# Patient Record
Sex: Male | Born: 1940 | Race: White | Hispanic: No | Smoking: Former smoker
Health system: Southern US, Community
[De-identification: ages and names within clinical notes are randomized; demographics above are authoritative.]

## PROBLEM LIST (undated history)

## (undated) DIAGNOSIS — F29 Unspecified psychosis not due to a substance or known physiological condition: Secondary | ICD-10-CM

## (undated) DIAGNOSIS — F0390 Unspecified dementia without behavioral disturbance: Secondary | ICD-10-CM

## (undated) DIAGNOSIS — F419 Anxiety disorder, unspecified: Secondary | ICD-10-CM

## (undated) DIAGNOSIS — M1990 Unspecified osteoarthritis, unspecified site: Secondary | ICD-10-CM

## (undated) DIAGNOSIS — I739 Peripheral vascular disease, unspecified: Secondary | ICD-10-CM

## (undated) DIAGNOSIS — E119 Type 2 diabetes mellitus without complications: Secondary | ICD-10-CM

## (undated) DIAGNOSIS — I10 Essential (primary) hypertension: Secondary | ICD-10-CM

## (undated) DIAGNOSIS — D649 Anemia, unspecified: Secondary | ICD-10-CM

## (undated) DIAGNOSIS — I2510 Atherosclerotic heart disease of native coronary artery without angina pectoris: Secondary | ICD-10-CM

## (undated) DIAGNOSIS — N400 Enlarged prostate without lower urinary tract symptoms: Secondary | ICD-10-CM

## (undated) DIAGNOSIS — M069 Rheumatoid arthritis, unspecified: Secondary | ICD-10-CM

## (undated) DIAGNOSIS — E7800 Pure hypercholesterolemia, unspecified: Secondary | ICD-10-CM

## (undated) DIAGNOSIS — R531 Weakness: Secondary | ICD-10-CM

## (undated) DIAGNOSIS — F32A Depression, unspecified: Secondary | ICD-10-CM

## (undated) DIAGNOSIS — I70269 Atherosclerosis of native arteries of extremities with gangrene, unspecified extremity: Secondary | ICD-10-CM

## (undated) DIAGNOSIS — F329 Major depressive disorder, single episode, unspecified: Secondary | ICD-10-CM

## (undated) DIAGNOSIS — R269 Unspecified abnormalities of gait and mobility: Secondary | ICD-10-CM

## (undated) DIAGNOSIS — F319 Bipolar disorder, unspecified: Secondary | ICD-10-CM

## (undated) HISTORY — DX: Peripheral vascular disease, unspecified: I73.9

## (undated) HISTORY — PX: BALLOON ANGIOPLASTY, ARTERY: SHX564

---

## 1968-03-17 HISTORY — PX: KNEE ARTHROSCOPY: SUR90

## 2000-07-23 ENCOUNTER — Encounter (HOSPITAL_COMMUNITY): Admission: RE | Admit: 2000-07-23 | Discharge: 2000-08-22 | Payer: Self-pay | Admitting: Rheumatology

## 2000-07-23 ENCOUNTER — Encounter: Payer: Self-pay | Admitting: Rheumatology

## 2000-11-19 ENCOUNTER — Encounter (HOSPITAL_COMMUNITY): Admission: RE | Admit: 2000-11-19 | Discharge: 2000-12-19 | Payer: Self-pay | Admitting: Rheumatology

## 2000-12-31 ENCOUNTER — Encounter (HOSPITAL_COMMUNITY): Admission: RE | Admit: 2000-12-31 | Discharge: 2001-01-30 | Payer: Self-pay | Admitting: Rheumatology

## 2001-02-25 ENCOUNTER — Encounter (HOSPITAL_COMMUNITY): Admission: RE | Admit: 2001-02-25 | Discharge: 2001-03-27 | Payer: Self-pay | Admitting: Oncology

## 2001-04-22 ENCOUNTER — Encounter (HOSPITAL_COMMUNITY): Admission: RE | Admit: 2001-04-22 | Discharge: 2001-05-22 | Payer: Self-pay | Admitting: Rheumatology

## 2001-06-17 ENCOUNTER — Encounter (HOSPITAL_COMMUNITY): Admission: RE | Admit: 2001-06-17 | Discharge: 2001-07-17 | Payer: Self-pay | Admitting: Rheumatology

## 2001-08-26 ENCOUNTER — Encounter (HOSPITAL_COMMUNITY): Admission: RE | Admit: 2001-08-26 | Discharge: 2001-09-25 | Payer: Self-pay | Admitting: Rheumatology

## 2001-10-07 ENCOUNTER — Encounter (HOSPITAL_COMMUNITY): Admission: RE | Admit: 2001-10-07 | Discharge: 2001-11-06 | Payer: Self-pay | Admitting: Rheumatology

## 2001-12-30 ENCOUNTER — Encounter (HOSPITAL_COMMUNITY): Admission: RE | Admit: 2001-12-30 | Discharge: 2002-01-29 | Payer: Self-pay | Admitting: Rheumatology

## 2002-03-24 ENCOUNTER — Encounter (HOSPITAL_COMMUNITY): Admission: RE | Admit: 2002-03-24 | Discharge: 2002-04-23 | Payer: Self-pay | Admitting: Rheumatology

## 2002-06-30 ENCOUNTER — Encounter (HOSPITAL_COMMUNITY): Admission: RE | Admit: 2002-06-30 | Discharge: 2002-07-30 | Payer: Self-pay | Admitting: Rheumatology

## 2002-10-06 ENCOUNTER — Encounter (HOSPITAL_COMMUNITY): Admission: RE | Admit: 2002-10-06 | Discharge: 2002-11-05 | Payer: Self-pay | Admitting: Rheumatology

## 2002-12-01 ENCOUNTER — Encounter (HOSPITAL_COMMUNITY): Admission: RE | Admit: 2002-12-01 | Discharge: 2002-12-15 | Payer: Self-pay | Admitting: Rheumatology

## 2006-05-01 ENCOUNTER — Ambulatory Visit: Payer: Self-pay | Admitting: Cardiology

## 2006-05-15 ENCOUNTER — Ambulatory Visit: Payer: Self-pay | Admitting: Cardiology

## 2007-05-20 ENCOUNTER — Ambulatory Visit (HOSPITAL_COMMUNITY): Admission: RE | Admit: 2007-05-20 | Discharge: 2007-05-20 | Payer: Self-pay | Admitting: Ophthalmology

## 2007-06-17 ENCOUNTER — Ambulatory Visit (HOSPITAL_COMMUNITY): Admission: RE | Admit: 2007-06-17 | Discharge: 2007-06-17 | Payer: Self-pay | Admitting: Ophthalmology

## 2010-03-17 HISTORY — PX: TRANSURETHRAL RESECTION OF PROSTATE: SHX73

## 2010-08-02 NOTE — Consult Note (Signed)
NAME:  Terry Green, Terry Green NO.:  [id number]   MEDICAL RECORD NO.:  [id number]                   PATIENT TYPE:   LOCATION:                                       FACILITY:   PHYSICIAN:  Aundra Dubin, M.D.            DATE OF BIRTH:   DATE OF CONSULTATION:  12/30/2001  DATE OF DISCHARGE:                                   CONSULTATION   CHIEF COMPLAINT:  RA, diabetes.   HISTORY OF PRESENT ILLNESS:  The patient returns reporting that he is doing  quite well.  There are no active joints that are bothering him.  However, he  stopped the methotrexate three weeks ago.  He has had no flaring since this  time.  He is tolerating the Plaquenil and there has been no nausea.  He has  had no URIs, fever, cough, nausea, stomatitis, or shortness of breath.  His  weight is down 6 pounds and he says he has been working a great deal.  He  brings a list of his morning blood sugars for about three weeks and one was  160 and another 180, and otherwise they were quite good, being less than  120.  There is no polyuria or polydipsia.   MEDICINES:  1. Methotrexate 25 mg - presently off.  2. Folic acid 1 mg q.d.  3. Plaquenil 600 mg q.d.  4. Prednisone 5 mg q.d.  5. Glucotrol XL 5 mg q.d.  6. Glucovance 5/500 mg b.i.d.  7. Avalide one q.d.  8. Starlix 120 mg q.d.   PHYSICAL EXAMINATION:  VITAL SIGNS:  Weight 174 pounds, blood pressure  138/78, respirations 16.  GENERAL:  No distress.  LUNGS:  Clear.  HEART:  Regular with no murmur.  LOWER EXTREMITIES:  No edema.  MUSCULOSKELETAL:  He continues with chronic swollen PIPs which are stiff and  do not fully flex.  They are nontender.  The MCPs appear most swollen at the  second and third MCPs but they are nontender.  The wrists demonstrate mild  stiffness, elbows extended fully, and there were no nodules.  Shoulders with  good range of motion, back nontender.  Knees, ankles, and feet had no active  swelling and were all  nontender.   ASSESSMENT AND PLAN:  1. Rheumatoid arthritis.  Presently he is doing quite well.  He has not been     off of methotrexate long enough to typically flare.  I have encouraged     him to continue with the methotrexate at 25 mg every week.  I am lowering     the Plaquenil to 400 mg q.d.  The prednisone will be lowered to 5 mg     q.o.d., taken on even days.  Labs checked on October 07, 2001 showed a     creatinine 0.9, AST 17, albumin 3.5.  WBC 6.7, HGB 12.6, PLT 259.  Will  check labs today.  2.     Diabetes.  He seems to be in good control and hopefully we will have him off     of prednisone over the next several months.   He will return in three months.                                               Aundra Dubin, M.D.    WWT/MEDQ  D:  12/30/2001  T:  12/30/2001  Job:  [id number]   cc:   Doreen Beam  [address]  [place]  [place] [address]  Fax: [phone]

## 2010-08-02 NOTE — Consult Note (Signed)
[facility]  Patient:    Terry Green, Terry Green Visit Number: [id number] MRN: [id number]          Service Type: RHE Location: [facility] Attending Physician:  Aundra Dubin Dictated by:   Aundra Dubin, M.D. Proc. Date: 04/22/01 Admit Date:  04/22/2001   CC:         Xaje A. Olena Leatherwood, M.D.   Consultation Report  CHIEF COMPLAINT:  Rheumatoid arthritis, diabetes.  HISTORY:  Terry Green returns reporting that with the cold weather he is significantly aching in his fingers.  Several PIPs are swollen and hurt considerably.  He is stiff in the mornings now for about 45 minutes.  Other joints are not bothering him.  He stated that he still feels more overall improved than he was before the methotrexate.  There have been no URIs, fever, cough, nausea, stomatitis, or shortness of breath.  The diabetes seems to be under well control by his report and there is no polyuria or polydipsia.  MEDICATIONS: 1. Methotrexate 20 mg q.week. 2. Glucovance 5/500 mg q.d. 3. Prednisone 5 mg q.d. 4. Folic acid 1 mg q.d. 5. Calcium 600 mg b.i.d. 6. Starlix 120 mg a.c. 7. Accupril 20 mg q.d.  PHYSICAL EXAMINATION  VITAL SIGNS:  Weight 176 pounds, blood pressure 150/80, respirations 20.  GENERAL:  He appears well.  LUNGS:  Clear.  HEART:  Regular.  No murmur.  NECK:  Negative JVD.  EXTREMITIES:  Lower extremities:  No edema.  MUSCULOSKELETAL:  On the bilateral hands the third and second PIPs are swollen, boggy, and tender.  The MCPs are also tender.  There is mild warmth to the wrist and there is slight tenderness.  Elbows extend almost fully and are nontender.  Shoulders, knees, ankles, and feet have a good range of motion and are nontender.  ASSESSMENT AND PLAN: 1. Rheumatoid arthritis.  He has continued to show some swelling to the    fingers.  I will have him increase the methotrexate to 22.5 mg q.week and    he will continue the folic acid.  For this mild to  moderate flare he is    given an injection of 120 mg of Depo-Medrol.  He will overall continue on 5    mg prednisone q.d. and continue taking the calcium. 2. Diabetes.  I have also advised him that his blood sugars have good    likelihood of increasing into the 200 range for two to four days.  We will check laboratories today.  Laboratories were stable in December when they were last checked.  We will then recheck laboratories in two months.  I will see him back in three months. Dictated by:   Aundra Dubin, M.D. Attending Physician:  Aundra Dubin DD:  04/22/01 TD:  04/22/01 Job: [id number]

## 2010-08-02 NOTE — Consult Note (Signed)
[name], Green                          ACCOUNT NO.:  [id number]   MEDICAL RECORD NO.:  [id number]                   PATIENT TYPE:   LOCATION:                                       FACILITY:   PHYSICIAN:  Aundra Dubin, M.D.            DATE OF BIRTH:   DATE OF CONSULTATION:  12/01/2002  DATE OF DISCHARGE:                                   CONSULTATION   CHIEF COMPLAINT:  Rheumatoid arthritis, diabetes.   INDICATIONS FOR PROCEDURE:  Terry Green returns reporting that he is doing  well.  He has no achy joints.  He feels that he has good grip strength.  He  says he has no trouble and no pain.  He has had no URIs, fever, cough,  shortness of breath, polyuria, polydipsia.  He feels that his blood sugars  have been under good control.  There has been no visual changes.  His weight  is stable, and he is down 2 pounds.   MEDICATIONS:  1. Methotrexate 25 mg every week.  2. Plaquenil 400 mg daily.  3. Calcium daily.  4. Folic acid 1 mg daily.  5. Glucovance 5/500 one b.i.d.  6. Avalide 300/12.5 mg daily.  7. Starlix 120 mg daily.  8. Cartia XT 120 mg daily.   PHYSICAL EXAMINATION:  VITAL SIGNS:  Weight 184 pounds, blood pressure  160/80, respirations 16.  GENERAL:  He appears healthy.  LUNGS:  Clear.  NECK:  Negative JVD.  Normal thyroid.  HEART:  Regular.  No murmur.  MUSCULOSKELETAL:  He has a great deal of stiffness that might be related to  the diabetes.  There are mild-to-moderate arthritic changes at the MCPs and  PIPs, but these areas are cool and nontender.  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  It is nontender.  The elbows extend almost fully.  The shoulders  move well with slight stiffness.  The knees, ankles, and feet have a good  range of motion and are nontender.   ASSESSMENT AND PLAN:  1. Rheumatoid arthritis.  He is quite stable.  Labs checked on 10/06/2002     show an AST of 18, albumin 3.6, creatinine 1.0, WBC 5.7, HGB 13.5, PLT     231.  Terry Green  has been notified that I will no longer be coming to     [facility] for my usual clinic.  This is stopping in late October.  He is     desiring to follow up with a rheumatologist in [place].  2. Diabetes.                                               Aundra Dubin, M.D.    WWT/MEDQ  D:  12/01/2002  T:  12/01/2002  Job:  [id number]  cc:   Doreen Beam  [address]  [place]  [place] [address]  Fax: [phone]

## 2010-08-02 NOTE — Consult Note (Signed)
[facility]  Patient:    Terry Green, Terry Green Visit Number: [id number] MRN: [id number]          Service Type: RHE Location: [facility] Attending Physician:  Aundra Dubin Dictated by:   Nathaneil Canary, M.D. Proc. Date: 07/15/01 Admit Date:  06/17/2001   CC:         Xaje A. Olena Leatherwood, M.D.   Consultation Report  CHIEF COMPLAINT:  Rheumatoid arthritis, diabetes.  HISTORY OF PRESENT ILLNESS:  The patient returns reporting that the injection in February helped for a while, but he is still aching quite a bit in the PIPs and MCPs. These areas are quite stiff. He has decreased grip strength. He is stiff in the mornings for about 45 minutes. The cold weather has been hard on him this year. He has had no URIs, fever, cough, nausea, or stomatitis. He reports that sometimes his blood sugars are in the 400s and then says that a fairly good blood sugar is around 200. He has no polyuria or polydipsia. His weight is stable.  CURRENT MEDICATIONS: 1. Prednisone 5 mg q.d. 2. Methotrexate 22.5 mg q. week. 3. Folic acid 1 mg q.d. 4. Calcium with vitamin D q.d. 5. Glucotrol XL 5 mg q.d. 6. Glucovance 5/500 mg b.i.d. 7. Lotrel 5/20 mg q.d. 8. Avalide q.d. 9. Tylenol p.r.n.  PHYSICAL EXAMINATION:  VITAL SIGNS:  Weight 177 pounds. Blood pressure 110/70, respirations 16.  GENERAL:  He appears well.  LUNGS:  Clear.  HEART:  Negative.  NECK:  Negative JVD.  LOWER EXTREMITIES:  No edema.  MUSCULOSKELETAL:  He still has tender, slightly warm synovitis to the right greater than left MCPs. PIPs are very stiff, and he has a moderatly poor range of motion with these joints. Wrists have mild tenderness. Elbows, shoulders good range of motion. The knees, ankles, and feet have a good range of motion, and the MTPs are mildly tender.  ASSESSMENT: 1. Rheumatoid arthritis. He remains moderately active, particularly with    the hand joints. I will have him increase the  methotrexate to a total    of 25 mg q. week. He will take five tablets q. Tuesday and five tablets q.    Wednesday morning, all within 24 hours. This will likely give him    better absorption. He will continue on prednisone 5 mg a day. I would    like to stop this as he comes back.     I am starting him on Plaquenil 600 mg q.d. I have discussed that there    is a 1 in 2000 chance of macular degeneration and that he will have    his eyes checked about every six to nine months by an ophthalmologist    to evaluate for this rare side effect. There is also a chance of nausea,    rashes. 2. Diabetes. As we check labs in the first week of June, we will also    evaluate an A1C.  He will return in three months. Dictated by:   Nathaneil Canary, M.D. Attending Physician:  Aundra Dubin DD:  07/15/01 TD:  07/16/01 Job: [id number]

## 2010-08-02 NOTE — Consult Note (Signed)
[facility]  Patient:    Terry Green, Terry Green Visit Number: [id number] MRN: [id number]          Service Type: RHE Location: [facility] Attending Physician:  Aundra Dubin Dictated by:   Aundra Dubin, M.D. Proc. Date: 02/25/01 Admit Date:  02/25/2001   CC:         Annette Stable A. Olena Leatherwood, M.D.   Consultation Report  CHIEF COMPLAINT:  Rheumatoid arthritis, diabetes.  HISTORY:  Mr. Andres returns reporting that he is feeling well.  He has very little early morning stiffness.  He has no majorly aching or swollen joints. The pain is quite minimal at this point.  He is very pleased with how he is doing.  There has been no URIs, fever, cough, nausea, stomatitis, shortness of breath, polyuria, or polydypsia.  He tells me his blood sugars have been as high as 135 in the morning in the past week but generally they are 90-110.  MEDICATIONS: 1. Prednisone 5 mg q.d. 2. Glucovance 5/500 q.d. 3. Methotrexate 20 mg q.week. 4. Folic acid 1 mg q.d. 5. Calcium and vitamin D q.d. 6. Starlix 120 mg a.c. 7. Accupril 20 mg q.d.  PHYSICAL EXAMINATION  VITAL SIGNS:  Weight 173 pounds, blood pressure 130/90, respirations 16.  GENERAL:  He appears well.  LUNGS:  Clear.  NECK:  Negative JVD.  EXTREMITIES:  Lower extremities:  No edema.  HEART:  Regular.  No murmur.  MUSCULOSKELETAL:  There is slight swelling to several PIPs but they are cool and nontender.  The MCPs have less swelling and are nontender.  The wrists have chronic swelling, but are cool and nontender.  Elbows, shoulders:  Good range of motion with mild stiffness.  The knees, ankles, and feet have a good range of motion and show no active arthritis.  ASSESSMENT AND PLAN: 1. Rheumatoid arthritis.  He continues to do well and has made a very good    response to the methotrexate.  This will be continued at 20 mg q.week.  We    will check laboratories at this time.  Concerning the prednisone, we will    most  likely use this to some degree over the winter and stop it in the    spring.  He will continue mostly on 5 mg q.d. but when he feels he is doing    well he can take this q.o.d. 2. Diabetes.  I believe his blood sugars are reasonably well controlled.  He will return in two months. Dictated by:   Aundra Dubin, M.D. Attending Physician:  Aundra Dubin DD:  02/25/01 TD:  02/25/01 Job: [id number]

## 2010-08-02 NOTE — Consult Note (Signed)
NAME:  Terry Green, Terry Green                          ACCOUNT NO.:  [id number]   MEDICAL RECORD NO.:  [id number]                   PATIENT TYPE:   LOCATION:                                       FACILITY:   PHYSICIAN:  Aundra Dubin, M.D.            DATE OF BIRTH:   DATE OF CONSULTATION:  07/14/2002  DATE OF DISCHARGE:                                   CONSULTATION   CHIEF COMPLAINT:  Rheumatoid arthritis.   HISTORY OF PRESENT ILLNESS:  The patient returns that he is doing quite  well.  He has no active joints.  He has questions about Remicade and I have  discussed this with him.  It would be a good compatible medicine with his  methotrexate but he probably does not need it because he is doing so well.  He is continuing with the Plaquenil.  His main difficulty is constipation  which he says occurs on the days that he takes the methotrexate and the day  after.  He has had no URIs, fever, coughs, or shortness of breath.  His  weight is up 5 pounds.   MEDICINES:  1. Methotrexate 25 mg every week.  2. Prednisone - off.  3. Plaquenil 400 mg daily.   Other medicines are reviewed and unchanged from March 24, 2002.   PHYSICAL EXAMINATION:  VITAL SIGNS:  Weight 186 pounds, blood pressure  130/80, respirations 16.  GENERAL:  No distress.  LUNGS:  Clear.  HEART:  Regular, no murmur.  Lower extremities with no edema.  MUSCULOSKELETAL:  He continues to have chronic arthritic changes with a  flexion contracture at the third PIP.  The MCPs are chronically swollen but  nontender.  The wrists have no activity.  Elbows, shoulders, knees, ankles,  and feet have a good range of motion and are nontender.   ASSESSMENT AND PLAN:  1. Rheumatoid arthritis.  He is doing quite well.  The plan is to continue     methotrexate 25 mg every week and 400 mg of Plaquenil daily.  He had labs     on June 30, 2002 which showed an albumin 3.9, AST 19, creatinine 1.0,     and a normal CBC.  We will check labs  again in mid July.  2. Constipation.  I have advised him to use a Metamucil-type product to see     if this will help with the constipation.  If after three weeks he is     still having the difficulty during the days that he takes the     methotrexate then he will     lower this to 20 mg every week.  3. Diabetes.   He will return in four months.  Aundra Dubin, M.D.    WWT/MEDQ  D:  07/14/2002  T:  07/14/2002  Job:  [id number]   cc:   Doreen Beam  [address]  [place]  [place] [address]  Fax: [phone]

## 2010-08-02 NOTE — Consult Note (Signed)
Terry Green, Terry Green NO.:  [id number]   MEDICAL RECORD NO.:  [id number]                   PATIENT TYPE:   LOCATION:                                       FACILITY:   PHYSICIAN:  Aundra Dubin, M.D.            DATE OF BIRTH:   DATE OF CONSULTATION:  03/24/2002  DATE OF DISCHARGE:                                   CONSULTATION   CHIEF COMPLAINT:  Rheumatoid arthritis, DIABETES MELLITUS.   HISTORY OF PRESENT ILLNESS:  The patient reports that he has continued on  600 mg of Plaquenil.  He did have his eyes checked on  ______ by an  ophthalmologist, and his eyes are okay.  He tells me he does not have signs  of diabetic retinopathy in addition.  There are no visual changes.  He  reports that he is doing well.  He can use his hands vigorously, and he  works his 12-hour shifts throughout the week.  There has been no URI, fever,  cough, shortness of breath, or stomatitis.  He has been off of prednisone  now for about one week.  He says he cannot tell a difference that the  arthritis has worsened any.  Overall, his pain and stiffness are quite  minimal.   MEDICATIONS:  1. Methotrexate 25 mg each week.  2. Plaquenil 600 mg daily.  3. Off prednisone.  4. Calcium with vitamin D daily.  5. Multivitamin.  6. Glucovance 5/500 b.i.d.  7. Avalide daily.  8. Starlix 100 mg daily.  9. Folic acid 1 mg daily.   PHYSICAL EXAMINATION:  VITAL SIGNS:  Weight 181 pounds.  Blood pressure  160/80, respirations 16.  GENERAL:  He appears well.  SKIN:  Clear.  LUNGS:  Clear.  HEART:  Regular.  No murmur.  EXTREMITIES:  Lower extremities, no edema.  MUSCULOSKELETAL:  He has some stiffness to several PIPs on both hands, but  they are nontender.  The MCPs have minor fullness and are nontender.  The  wrists are cool.  They have slight decreased range of motion and are  nontender.  Elbows, shoulders good range of motion with mild stiffness.  Back nontender.  Knees,  ankles, and feet have a good range of motion and are  nontender.   ASSESSMENT AND PLAN:  1. Rheumatoid arthritis:  Overall, he is doing well and is stable.  The     Plaquenil again is to be reduced to 400 mg daily.  He will continue with     methotrexate as above.  Laboratories on December 30, 2001, showed a WBC     6.6, HGB 12.4, PLT 243.  AST 17, albumin 3.7, and creatinine 1.0.  I will     allow him to use a small amount of     prednisone as needed through the winter.  He is careful with this and  will use a minimal amount.  2. Diabetes.  3. I will see him back in four months, and we will also check laboratories     in three months in addition.                                               Aundra Dubin, M.D.    WWT/MEDQ  D:  03/24/2002  T:  03/24/2002  Job:  [id number]   cc:   Doreen Beam  [address]  [place]  [place] [address]  Fax: [phone]

## 2010-08-02 NOTE — Consult Note (Signed)
[facility]  Patient:    Terry Green, PAVON Visit Number: [id number] MRN: [id number]          Service Type: RHE Location: [facility] Attending Physician:  Aundra Dubin Dictated by:   Aundra Dubin, M.D. Proc. Date: 12/31/00 Admit Date:  12/31/2000   CC:         Xaje A. Olena Leatherwood, M.D., [address] [place], [place] [address]   Consultation Report  CHIEF COMPLAINT:  Rheumatoid arthritis, diabetes.  HISTORY OF PRESENT ILLNESS:  Mr. Glosser returns reporting that he feels considerably better.  He has started the methotrexate and is not having problems with this and says he has had no nausea or stomatitis.  There have been no URIs, fever, cough, or shortness of breath.  Particularly his hands were improved, and he is much less stiff in the mornings.  The early morning stiffness is maybe 20 minutes.  MEDICATIONS: 1. Methotrexate 12.5 mg weekly. 2. Prednisone 10 mg b.i.d. 3. GlucoVance 5/500 b.i.d. 4. Clonidine 0.2 mg b.i.d. 5. Starlix 120 mg a.c. 6. Accupril 20 mg q.d. 7. Calcium 600 mg with vitamin D q.d.  PHYSICAL EXAMINATION:  VITAL SIGNS:  Weight 172 pounds.  Blood pressure 140/72, respirations 18, pulse 76.  GENERAL:  He appears well.  LUNGS:  Clear.  NECK:  Negative JVD.  HEART:  Regular with no murmur.  EXTREMITIES:  Lower extremities show no edema.  MUSCULOSKELETAL:  The swelling to the PIPs and MCPs is lessened.  He still has contractures at the PIPs of several right fingers.  These areas are much less tender.  Wrists have slight fullness and are nontender.  Elbows and shoulders have a good range of motion and are nontender.  Knees, ankles, and feet all moved well and were nontender.  ASSESSMENT/PLAN: 1. Rheumatoid arthritis.  He has made a good response to the methotrexate and    is on prednisone 20 mg a day.  The plan is to increase the methotrexate the    20 mg each week.  We will check labs today.  I see that he is not on  folic    acid, and I will start him on 1 mg a day.     Concerning the prednisone, he will lower to a total of 15 mg a day until    January 29, 2001, and then to 10 mg a day.  I will try to further lower    the prednisone on return.  I will check labs again in four weeks.  2. Diabetes.  He will return in two months. Dictated by:   Aundra Dubin, M.D. Attending Physician:  Aundra Dubin DD:  12/31/00 TD:  12/31/00 Job: [id number]

## 2010-08-02 NOTE — Assessment & Plan Note (Signed)
[facility]                          [facility] OFFICE NOTE   NAME:Terry Green, Terry Green                       MRN:          [id number]  DATE:05/01/2006                            DOB:          08-Dec-1940    REASON FOR CONSULTATION:  Terry Green is a 70yo male, with no known cardiac  history, but with multiple cardiac risk factors for coronary artery  disease, now referred to Dr. Lewayne Green for evaluation of chest pain.   The patient reports a single episode of chest tightness occurring  approximately 5 weeks ago while at work at a [employer] in [place].  He  recalls pulling a 400-pound can of yarn and then experienced some upper  chest tightness which resolved several minutes later with rest.  He has  not had any recurrent angina pectoris nor any prior history of  exertional chest discomfort.  He does, however, have multiple cardiac  risk factors, notable for long-standing type 2 diabetes mellitus,  hypertension, long-standing history of tobacco smoking, history of  hyperlipidemia, and family history of coronary artery disease.  However,  the patient has never had a previous formal cardiac evaluation.   Electrocardiogram today revealed a sinus bradycardia with first degree  atrial ventricular block at 57 BPM with normal axis and LVH by voltage  criteria.   ALLERGIES:  LISINOPRIL/HCT (ANGIOEDEMA OF THE TONGUE).   CURRENT MEDICATIONS:  1. Benicar/HCT 40/12.5 every day.  2. Methotrexate 2.5 mg every Tuesday.  3. Metformin 1,000 mg b.i.d.  4. Glyburide 10 mg b.i.d.  5. Hydroxychloroquine 400 mg every day.  6. Diltiazem XR 240 every day.  7. Folic acid 1 mg every day.  8. Metoprolol 25 mg q.p.m. and 50 mg q.a.m.  9. Calcium 600/vitamin D.   PAST MEDICAL HISTORY:  1. Type 2 diabetes mellitus.  2. Hypertension.  3. Hyperlipidemia.  4. Arthritis.   SURGICAL HISTORY:  Status post remote lumbar laminectomy in the 1970s.   REVIEW OF SYSTEMS:   Denies any exertional angina pectoris, exertional  dyspnea, PND, orthopnea, or lower extremity edema, can climb a flight of  stairs with no associated chest discomfort or dyspnea, has occasional  heartburn symptoms, reports lower extremity discomfort particularly on  the right suggestive of intermittent claudication.   SOCIAL HISTORY:  The patient is married, lives in [place] and has a grown  daughter.  He stopped smoking one year ago, but previously smoked  approximately one pack a day for about 30 years.  Denies alcohol use.  The patient completed only 10th grade education.   FAMILY HISTORY:  Family deceased age 17, fatal myocardial infarction.  The patient has two brothers and one sister, deceased but none of whom  had any known coronary artery disease.   PHYSICAL EXAMINATION:  GENERAL:  A 70 year old male sitting upright in  no apparent distress.  HEENT:  Normocephalic atraumatic.  NECK:  Palpable bilateral carotid pulses with distant, right carotid  bruit but no bruit on the left.  No JVD at 90 degrees.  LUNGS:  Diminished breath sounds at the bases but without crackles or  wheezes.  HEART:  Regular rate and rhythm (S1 S2).  A soft grade 2/6 holosystolic  murmur at the base.  ABDOMEN:  Soft, nontender with intact bowel sounds and no bruits.  EXTREMITIES:  Palpable bilateral femoral pulses without bruits;  minimally palpable peripheral pulses with no significant edema.  NEUROLOGIC:  Flat affect, but no focal deficit.   IMPRESSION:  1. Chest pain.      a.     Singular episode.  2. Multiple cardiac risk factors.      a.     Type 2 diabetes mellitus.      b.     Hypertension.      c.     Hyperlipidemia.      d.     Long-standing history of tobacco.      e.     Family history coronary artery disease.      f.     Age.  3. Right carotid bruit.  4. Systolic murmur.  5. Intermittent claudication.  6. Sinus bradycardia/first degree AV block.   PLAN:  1. Schedule exercise stress  Cardiolite for risk stratification.  2. A 2D echocardiogram for assessment of left ventricular function and      further evaluation of systolic murmur.  3. Carotid Doppler to rule out significant cerebrovascular disease.  4. Fasting lipid profile.  5. Add low dose aspirin to current medication regimen.  6. Nitroglycerin p.r.n.  7. Lower extremity arterial Dopplers (ABIs) to exclude significant      peripheral vascular disease.  8. Schedule return clinic followup with myself and Terry Green for      review of study results and      further recommendations.  Of note, if stress test is abnormal, then      plan is to proceed with diagnostic cardiac catheterization.      Terry Serpe, PA-C  Electronically Signed      Terry Codding, MD,FACC  Electronically Signed   Terry Green  DD: 05/01/2006  DT: 05/01/2006  Job #: [id number]   cc:   Terry Green

## 2010-12-05 ENCOUNTER — Encounter (HOSPITAL_COMMUNITY): Payer: Medicare Other

## 2010-12-06 LAB — BASIC METABOLIC PANEL
BUN: 16
CO2: 26
Calcium: 9.6
Chloride: 103
Creatinine, Ser: 0.97
GFR calc Af Amer: >60
GFR calc non Af Amer: >60
Glucose, Bld: 93
Potassium: 3.7
Sodium: 138

## 2010-12-06 LAB — HEMOGLOBIN AND HEMATOCRIT, BLOOD
HCT: 38.2 — ABNORMAL LOW
Hemoglobin: 12.8 — ABNORMAL LOW

## 2010-12-10 ENCOUNTER — Encounter (HOSPITAL_COMMUNITY): Admission: RE | Payer: Self-pay | Source: Ambulatory Visit

## 2010-12-10 ENCOUNTER — Ambulatory Visit (HOSPITAL_COMMUNITY): Admission: RE | Admit: 2010-12-10 | Payer: Medicare Other | Source: Ambulatory Visit | Admitting: Urology

## 2010-12-10 SURGERY — TURP (TRANSURETHRAL RESECTION OF PROSTATE)
Anesthesia: Choice

## 2011-11-20 DIAGNOSIS — R42 Dizziness and giddiness: Secondary | ICD-10-CM

## 2012-03-23 ENCOUNTER — Encounter (HOSPITAL_COMMUNITY): Payer: Self-pay

## 2012-03-23 ENCOUNTER — Encounter (HOSPITAL_COMMUNITY): Payer: Self-pay | Admitting: Pharmacy Technician

## 2012-03-23 ENCOUNTER — Other Ambulatory Visit: Payer: Self-pay

## 2012-03-23 ENCOUNTER — Encounter (HOSPITAL_COMMUNITY)
Admission: RE | Admit: 2012-03-23 | Discharge: 2012-03-23 | Disposition: A | Discharge status: Home / Self Care | Payer: Medicare Other | Source: Ambulatory Visit | Attending: Urology | Admitting: Urology

## 2012-03-23 HISTORY — DX: Pure hypercholesterolemia, unspecified: E78.00

## 2012-03-23 HISTORY — DX: Essential (primary) hypertension: I10

## 2012-03-23 HISTORY — DX: Unspecified osteoarthritis, unspecified site: M19.90

## 2012-03-23 MED ORDER — SODIUM CHLORIDE 0.45 % IV SOLN: INTRAVENOUS | Status: DC

## 2012-03-23 NOTE — Patient Instructions (Addendum)
[address]SLICK  03/23/2012   Your procedure is scheduled on:  03/24/2012  Report to [facility] at  2 PM.  Call this number if you have problems the morning of surgery: [phone]   Remember:   Do not eat food:After Midnight.  May have clear liquids:until Midnight .    Take these medicines the morning of surgery with A SIP OF WATER: amlodipine   Do not wear jewelry, make-up or nail polish.  Do not wear lotions, powders, or perfumes.   Do not shave 48 hours prior to surgery. Men may shave face and neck.  Do not bring valuables to the hospital.  Contacts, dentures or bridgework may not be worn into surgery.  Leave suitcase in the car. After surgery it may be brought to your room.  For patients admitted to the hospital, checkout time is 11:00 AM the day of discharge.   Patients discharged the day of surgery will not be allowed to drive home.  Name and phone number of your driver: family  Special Instructions: N/A   Please read over the following fact sheets that you were given: Pain Booklet, Coughing and Deep Breathing, Surgical Site Infection Prevention, Anesthesia Post-op Instructions and Care and Recovery After Surgery PATIENT INSTRUCTIONS POST-ANESTHESIA  IMMEDIATELY FOLLOWING SURGERY:  Do not drive or operate machinery for the first twenty four hours after surgery.  Do not make any important decisions for twenty four hours after surgery or while taking narcotic pain medications or sedatives.  If you develop intractable nausea and vomiting or a severe headache please notify your doctor immediately.  FOLLOW-UP:  Please make an appointment with your surgeon as instructed. You do not need to follow up with anesthesia unless specifically instructed to do so.  WOUND CARE INSTRUCTIONS (if applicable):  Keep a dry clean dressing on the anesthesia/puncture wound site if there is drainage.  Once the wound has quit draining you may leave it open to air.  Generally you should leave the  bandage intact for twenty four hours unless there is drainage.  If the epidural site drains for more than 36-48 hours please call the anesthesia department.  QUESTIONS?:  Please feel free to call your physician or the hospital operator if you have any questions, and they will be happy to assist you.      Lithotripsy for Kidney Stones WHAT ARE KIDNEY STONES? The kidneys filter blood for chemicals the body cannot use. These waste chemicals are eliminated in the urine. They are removed from the body. Under some conditions, these chemicals may become concentrated. When this happens, they form crystals in the urine. When these crystals build up and stick together, stones may form. When these stones block the flow of urine through the urinary tract, they may cause severe pain. The urinary tract is very sensitive to blockage and stretching by the stone. WHAT IS LITHOTRIPSY? Lithotripsy is a treatment that can sometimes help eliminate kidney stones and pain faster. A form of lithotripsy, also known as ESWL (extracorporeal shock wave lithotripsy), is a nonsurgical procedure that helps your body rid itself of the kidney stone with a minimum amount of pain. EWSL is a method of crushing a kidney stone with shock waves. These shock waves pass through your body. They cause the kidney stones to crumble while still in the urinary tract. It is then easier for the smaller pieces of stone to pass in the urine. Lithotripsy usually takes about an hour. It is done in  a hospital, a lithotripsy center, or a mobile unit. It usually does not require an overnight stay. Your caregiver will instruct you on preparation for the procedure. Your caregiver will tell you what to expect afterward. LET YOUR CAREGIVER KNOW ABOUT:  Allergies.  Medicines taken including herbs, eye drops, over the counter medicines (including aspirin, aleve, or motrin for treatment of inflammatory conditions) and creams.  Use of steroids (by mouth or  creams).  Previous problems with anesthetics or novocaine.  Possibility of pregnancy, if this applies.  History of blood clots (thrombophlebitis).  History of bleeding or blood problems.  Previous surgery.  Other health problems. RISKS AND COMPLICATIONS Complications of lithotripsy are uncommon, but include the following:  Infection.  Bleeding of the kidney.  Bruising of the kidney or skin.  Obstruction of the ureter (the passageway from the kidney to the bladder).  Failure of the stone to fragment (break apart). PROCEDURE A stent (flexible tube with holes) may be placed in your ureter. The ureter is the tube that transports the urine from the kidneys to the bladder. Your caregiver may place a stent before the procedure. This will help keep urine flowing from the kidney if the fragments of the stone block the ureter. You may receive an intravenous (IV) line to give you fluids and medicines. These medicines may help you relax or make you sleep. During the procedure, you will lie comfortably on a fluid-filled cushion or in a warm-water bath. After an x-ray or ultrasound locates your stone, shock waves are aimed at the stone. If you are awake, you may feel a tapping sensation (feeling) as the shock waves pass through your body. If large stone particles remain after treatment, a second procedure may be necessary at a later date. For comfort during the test:  Relax as much as possible.  Try to remain still as much as possible.  Try to follow instructions to speed up the test.  Let your caregiver know if you are uncomfortable, anxious, or in pain. AFTER THE PROCEDURE  After surgery, you will be taken to the recovery area. A nurse will watch and check your progress. Once you're awake, stable, and taking fluids well, you will be allowed to go home as long as there are no problems. You may be prescribed antibiotics (medicines that kill germs) to help prevent infection. You may also be  prescribed pain medicine if needed. In a week or two, your doctor may remove your stent, if you have one. Your caregiver will check to see whether or not stone particles remain. PASSING THE STONE It may take anywhere from a day to several weeks for the stone particles to leave your body. During this time, drink at least 8 to 12 eight ounce glasses of water every day. It is normal for your urine to be cloudy or slightly bloody for a few weeks following this procedure. You may even see small pieces of stone in your urine. A slight fever and some pain are also normal. Your caregiver may ask you to strain your urine to collect some stone particles for chemical analysis. If you find particles while straining the urine, save them. Analysis tells you and the caregiver what the stone is made of. Knowing this may help prevent future stones. PREVENTING FUTURE STONES  Drink about 8 to 12, eight-ounce glasses of water every day.  Follow the diet your caregiver recommends.  Take your prescribed medicine.  See your caregiver regularly for checkups. SEEK IMMEDIATE MEDICAL CARE IF:  You develop an oral temperature above 102 F (38.9 C), or as your caregiver suggests.  Your pain is not relieved by medicine.  You develop nausea (feeling sick to your stomach) and vomiting.  You develop heavy bleeding.  You have difficulty urinating. Document Released: 02/29/2000 Document Revised: 05/26/2011 Document Reviewed: 12/24/2007 [facility] Patient Information 2013 [place], [place].

## 2012-03-24 ENCOUNTER — Encounter (HOSPITAL_COMMUNITY): Payer: Self-pay | Admitting: *Deleted

## 2012-03-24 ENCOUNTER — Ambulatory Visit (HOSPITAL_COMMUNITY): Payer: Medicare Other

## 2012-03-24 ENCOUNTER — Ambulatory Visit (HOSPITAL_COMMUNITY)
Admission: RE | Admit: 2012-03-24 | Discharge: 2012-03-24 | Disposition: A | Discharge status: Home / Self Care | Payer: Medicare Other | Source: Ambulatory Visit | Attending: Urology | Admitting: Urology

## 2012-03-24 ENCOUNTER — Encounter (HOSPITAL_COMMUNITY)
Admission: RE | Disposition: A | Discharge status: Home / Self Care | Payer: Self-pay | Source: Ambulatory Visit | Attending: Urology

## 2012-03-24 DIAGNOSIS — N200 Calculus of kidney: Secondary | ICD-10-CM | POA: Insufficient documentation

## 2012-03-24 DIAGNOSIS — Z01812 Encounter for preprocedural laboratory examination: Secondary | ICD-10-CM | POA: Insufficient documentation

## 2012-03-24 DIAGNOSIS — E119 Type 2 diabetes mellitus without complications: Secondary | ICD-10-CM | POA: Insufficient documentation

## 2012-03-24 DIAGNOSIS — Z01810 Encounter for preprocedural cardiovascular examination: Secondary | ICD-10-CM | POA: Insufficient documentation

## 2012-03-24 SURGERY — LITHOTRIPSY, ESWL
Anesthesia: Moderate Sedation | Laterality: Left

## 2012-03-24 MED ORDER — DIPHENHYDRAMINE HCL 25 MG PO CAPS
25.0000 mg | ORAL_CAPSULE | Freq: Once | ORAL | Status: AC
  Administered 2012-03-24: 25 mg via ORAL

## 2012-03-24 MED ORDER — DIAZEPAM 5 MG PO TABS
10.0000 mg | ORAL_TABLET | Freq: Once | ORAL | Status: AC
  Administered 2012-03-24: 10 mg via ORAL

## 2012-03-24 MED ORDER — SODIUM CHLORIDE 0.45 % IV SOLN: INTRAVENOUS | Status: DC

## 2012-03-24 MED ORDER — DIAZEPAM 5 MG PO TABS
ORAL_TABLET | ORAL | Status: AC
  Filled 2012-03-24: qty 2

## 2012-03-24 MED ORDER — DIPHENHYDRAMINE HCL 25 MG PO CAPS
ORAL_CAPSULE | ORAL | Status: AC
  Filled 2012-03-24: qty 1

## 2012-03-24 SURGICAL SUPPLY — 3 items
CLOTH BEACON ORANGE TIMEOUT ST (SAFETY) IMPLANT
GOWN STRL REIN XL XLG (GOWN DISPOSABLE) IMPLANT
TOWEL OR 17X26 4PK STRL BLUE (TOWEL DISPOSABLE) IMPLANT

## 2012-03-24 NOTE — Progress Notes (Signed)
No change in H&P on reexamination. 

## 2012-03-24 NOTE — OR Nursing (Signed)
Pt. States he took asa  Last night ,  Due to this his procedure is cancelled until next week. Will be rescheduled  To have this done at [facility] next week.  Per Hope at office Elease Hashimoto will reschedule this case and will contact pt. To let him know what time it will be. Reinforced to pt. Donot take any Asa or asa products for 5 days prior to procedure.

## 2012-03-24 NOTE — H&P (Signed)
NAMEJACEN, CARLINI                ACCOUNT NO.:  [id number]  MEDICAL RECORD NO.:  [id number]  LOCATION:                                 FACILITY:  PHYSICIAN:  Ky Barban, M.D.DATE OF BIRTH:  1941/03/09  DATE OF ADMISSION:  03/24/2012 DATE OF DISCHARGE:  LH                             HISTORY & PHYSICAL   CHIEF COMPLAINT:  Left renal calculus.  HISTORY:  A 72 year old gentleman who is a patient of Dr. Robynn Pane.  He was recently in [facility], admitted with extreme weakness and having some nonspecific abdominal pain.  Workup showed that he has a large, at least 1.2 cm stone in the left kidney causing no obstruction and he also has microscopic hematuria, underwent workup for urine culture, cytologies.  Cystoscopy was done. Cystoscope was negative and he denies any history of having kidney stones in the past.  So I have advised him because the stone is rather large in the renal pelvis, to undergo procedure of ESL with complications, especially need for additional procedure is discussed with him, no guarantee about the results.  He is coming as outpatient, will undergo this procedure in [facility] as outpatient.  He had urine culture done in September which was negative.  He had blood cultures also done recently which are negative.  He was admitted with extreme weakness, no definite etiology of this problem was found.  He has chronic anemia which is probably because of his type 2 diabetes.  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  PERSONAL HISTORY:  Does not smoke or drink.  REVIEW OF SYSTEMS:  Unremarkable.  PHYSICAL EXAMINATION:  GENERAL:  He is a pleasant gentleman,  not in acute distress, fully conscious, alert, oriented. VITAL SIGNS:  Blood pressure 130/80, temperature is normal. CENTRAL NERVOUS SYSTEM:  No gross neurological deficit. HEAD, NECK, EYE, ENT:  Negative. CHEST:  Symmetrical.  Normal breath sounds. HEART:  Regular sinus  rhythm. ABDOMEN:  Soft, flat.  Liver, spleen, kidneys not palpable.  No CVA tenderness. GU:  External genitalia is circumcised.  Meatus adequate.  Testicles are normal. RECTAL:  Prostate 1.5+, smooth and firm.  No rectal mass.  IMPRESSION:  Left renal calculus, microscopic hematuria most likely secondary to this renal calculus.  I also want to mention he underwent TUR prostate for BPH in 2012 by me.     Ky Barban, M.D.     MIJ/MEDQ  D:  03/23/2012  T:  03/24/2012  Job:  [id number]  cc:   Dr. Robynn Pane

## 2012-05-10 ENCOUNTER — Other Ambulatory Visit (HOSPITAL_COMMUNITY): Payer: Self-pay | Admitting: Urology

## 2012-05-10 ENCOUNTER — Ambulatory Visit (HOSPITAL_COMMUNITY)
Admission: RE | Admit: 2012-05-10 | Discharge: 2012-05-10 | Disposition: A | Discharge status: Home / Self Care | Payer: Medicare Other | Source: Ambulatory Visit | Attending: Urology | Admitting: Urology

## 2012-05-10 DIAGNOSIS — Z09 Encounter for follow-up examination after completed treatment for conditions other than malignant neoplasm: Secondary | ICD-10-CM | POA: Insufficient documentation

## 2012-05-10 DIAGNOSIS — N200 Calculus of kidney: Secondary | ICD-10-CM

## 2012-06-22 ENCOUNTER — Other Ambulatory Visit (HOSPITAL_COMMUNITY): Payer: Self-pay | Admitting: Urology

## 2012-06-22 ENCOUNTER — Ambulatory Visit (HOSPITAL_COMMUNITY)
Admission: RE | Admit: 2012-06-22 | Discharge: 2012-06-22 | Disposition: A | Discharge status: Home / Self Care | Payer: Medicare Other | Source: Ambulatory Visit | Attending: Urology | Admitting: Urology

## 2012-06-22 DIAGNOSIS — N200 Calculus of kidney: Secondary | ICD-10-CM | POA: Insufficient documentation

## 2012-06-22 DIAGNOSIS — R109 Unspecified abdominal pain: Secondary | ICD-10-CM | POA: Insufficient documentation

## 2012-07-27 ENCOUNTER — Ambulatory Visit: Payer: Self-pay | Admitting: Vascular Surgery

## 2012-07-27 LAB — BASIC METABOLIC PANEL
Anion Gap: 5 — ABNORMAL LOW (ref 7–16)
Calcium, Total: 9.9 mg/dL (ref 8.5–10.1)
Chloride: 100 mmol/L (ref 98–107)
Co2: 31 mmol/L (ref 21–32)
EGFR (Non-African Amer.): >60
Glucose: 163 mg/dL — ABNORMAL HIGH (ref 65–99)
Potassium: 3.9 mmol/L (ref 3.5–5.1)

## 2012-09-07 ENCOUNTER — Ambulatory Visit: Payer: Self-pay | Admitting: Vascular Surgery

## 2012-09-07 LAB — BASIC METABOLIC PANEL
BUN: 17 mg/dL (ref 7–18)
Chloride: 97 mmol/L — ABNORMAL LOW (ref 98–107)
Co2: 29 mmol/L (ref 21–32)
EGFR (Non-African Amer.): >60
Glucose: 177 mg/dL — ABNORMAL HIGH (ref 65–99)
Osmolality: 274 (ref 275–301)

## 2012-09-20 ENCOUNTER — Ambulatory Visit: Payer: Self-pay | Admitting: Vascular Surgery

## 2012-09-20 LAB — BASIC METABOLIC PANEL
Anion Gap: 3 — ABNORMAL LOW (ref 7–16)
Calcium, Total: 9.5 mg/dL (ref 8.5–10.1)
Creatinine: 0.94 mg/dL (ref 0.60–1.30)
Glucose: 217 mg/dL — ABNORMAL HIGH (ref 65–99)
Osmolality: 284 (ref 275–301)
Potassium: 4.1 mmol/L (ref 3.5–5.1)
Sodium: 137 mmol/L (ref 136–145)

## 2012-09-20 LAB — CBC
HCT: 31.1 % — ABNORMAL LOW (ref 40.0–52.0)
MCH: 22.1 pg — ABNORMAL LOW (ref 26.0–34.0)
MCV: 71 fL — ABNORMAL LOW (ref 80–100)
Platelet: 266 10*3/uL (ref 150–440)
RBC: 4.41 10*6/uL (ref 4.40–5.90)
RDW: 19.7 % — ABNORMAL HIGH (ref 11.5–14.5)

## 2012-09-22 ENCOUNTER — Ambulatory Visit: Payer: Self-pay | Admitting: Vascular Surgery

## 2012-09-30 ENCOUNTER — Ambulatory Visit (HOSPITAL_COMMUNITY)
Admission: RE | Admit: 2012-09-30 | Discharge: 2012-09-30 | Disposition: A | Discharge status: Home / Self Care | Payer: Medicare Other | Source: Ambulatory Visit | Attending: Urology | Admitting: Urology

## 2012-09-30 ENCOUNTER — Other Ambulatory Visit (HOSPITAL_COMMUNITY): Payer: Self-pay | Admitting: Urology

## 2012-09-30 DIAGNOSIS — N200 Calculus of kidney: Secondary | ICD-10-CM | POA: Insufficient documentation

## 2012-10-21 ENCOUNTER — Other Ambulatory Visit (HOSPITAL_COMMUNITY): Payer: Self-pay | Admitting: Urology

## 2012-10-21 DIAGNOSIS — N200 Calculus of kidney: Secondary | ICD-10-CM

## 2012-10-26 ENCOUNTER — Ambulatory Visit (HOSPITAL_COMMUNITY): Payer: Medicare Other

## 2012-10-29 ENCOUNTER — Ambulatory Visit (HOSPITAL_COMMUNITY)
Admission: RE | Admit: 2012-10-29 | Discharge: 2012-10-29 | Disposition: A | Discharge status: Home / Self Care | Payer: Medicare Other | Source: Ambulatory Visit | Attending: Urology | Admitting: Urology

## 2012-10-29 DIAGNOSIS — N200 Calculus of kidney: Secondary | ICD-10-CM | POA: Insufficient documentation

## 2014-03-08 DIAGNOSIS — Z794 Long term (current) use of insulin: Secondary | ICD-10-CM | POA: Diagnosis not present

## 2014-03-08 DIAGNOSIS — E119 Type 2 diabetes mellitus without complications: Secondary | ICD-10-CM | POA: Diagnosis not present

## 2014-03-08 DIAGNOSIS — L97311 Non-pressure chronic ulcer of right ankle limited to breakdown of skin: Secondary | ICD-10-CM | POA: Diagnosis not present

## 2014-03-08 DIAGNOSIS — I872 Venous insufficiency (chronic) (peripheral): Secondary | ICD-10-CM | POA: Diagnosis not present

## 2014-03-09 DIAGNOSIS — L97311 Non-pressure chronic ulcer of right ankle limited to breakdown of skin: Secondary | ICD-10-CM | POA: Diagnosis not present

## 2014-03-09 DIAGNOSIS — E119 Type 2 diabetes mellitus without complications: Secondary | ICD-10-CM | POA: Diagnosis not present

## 2014-03-09 DIAGNOSIS — Z794 Long term (current) use of insulin: Secondary | ICD-10-CM | POA: Diagnosis not present

## 2014-03-09 DIAGNOSIS — I872 Venous insufficiency (chronic) (peripheral): Secondary | ICD-10-CM | POA: Diagnosis not present

## 2014-03-16 DIAGNOSIS — Z794 Long term (current) use of insulin: Secondary | ICD-10-CM | POA: Diagnosis not present

## 2014-03-16 DIAGNOSIS — I872 Venous insufficiency (chronic) (peripheral): Secondary | ICD-10-CM | POA: Diagnosis not present

## 2014-03-16 DIAGNOSIS — E119 Type 2 diabetes mellitus without complications: Secondary | ICD-10-CM | POA: Diagnosis not present

## 2014-03-16 DIAGNOSIS — L97311 Non-pressure chronic ulcer of right ankle limited to breakdown of skin: Secondary | ICD-10-CM | POA: Diagnosis not present

## 2014-03-20 DIAGNOSIS — I872 Venous insufficiency (chronic) (peripheral): Secondary | ICD-10-CM | POA: Diagnosis not present

## 2014-03-20 DIAGNOSIS — Z794 Long term (current) use of insulin: Secondary | ICD-10-CM | POA: Diagnosis not present

## 2014-03-20 DIAGNOSIS — L97311 Non-pressure chronic ulcer of right ankle limited to breakdown of skin: Secondary | ICD-10-CM | POA: Diagnosis not present

## 2014-03-20 DIAGNOSIS — E119 Type 2 diabetes mellitus without complications: Secondary | ICD-10-CM | POA: Diagnosis not present

## 2014-03-21 DIAGNOSIS — E11622 Type 2 diabetes mellitus with other skin ulcer: Secondary | ICD-10-CM | POA: Diagnosis not present

## 2014-03-21 DIAGNOSIS — I87311 Chronic venous hypertension (idiopathic) with ulcer of right lower extremity: Secondary | ICD-10-CM | POA: Diagnosis not present

## 2014-03-21 DIAGNOSIS — S91101A Unspecified open wound of right great toe without damage to nail, initial encounter: Secondary | ICD-10-CM | POA: Diagnosis not present

## 2014-03-21 DIAGNOSIS — L97311 Non-pressure chronic ulcer of right ankle limited to breakdown of skin: Secondary | ICD-10-CM | POA: Diagnosis not present

## 2014-03-21 DIAGNOSIS — S91104A Unspecified open wound of right lesser toe(s) without damage to nail, initial encounter: Secondary | ICD-10-CM | POA: Diagnosis not present

## 2014-03-21 DIAGNOSIS — L97319 Non-pressure chronic ulcer of right ankle with unspecified severity: Secondary | ICD-10-CM | POA: Diagnosis not present

## 2014-03-21 DIAGNOSIS — E11621 Type 2 diabetes mellitus with foot ulcer: Secondary | ICD-10-CM | POA: Diagnosis not present

## 2014-03-21 DIAGNOSIS — I70235 Atherosclerosis of native arteries of right leg with ulceration of other part of foot: Secondary | ICD-10-CM | POA: Diagnosis not present

## 2014-03-24 DIAGNOSIS — I872 Venous insufficiency (chronic) (peripheral): Secondary | ICD-10-CM | POA: Diagnosis not present

## 2014-03-24 DIAGNOSIS — E119 Type 2 diabetes mellitus without complications: Secondary | ICD-10-CM | POA: Diagnosis not present

## 2014-03-24 DIAGNOSIS — Z794 Long term (current) use of insulin: Secondary | ICD-10-CM | POA: Diagnosis not present

## 2014-03-24 DIAGNOSIS — L97311 Non-pressure chronic ulcer of right ankle limited to breakdown of skin: Secondary | ICD-10-CM | POA: Diagnosis not present

## 2014-03-27 DIAGNOSIS — I872 Venous insufficiency (chronic) (peripheral): Secondary | ICD-10-CM | POA: Diagnosis not present

## 2014-03-27 DIAGNOSIS — R419 Unspecified symptoms and signs involving cognitive functions and awareness: Secondary | ICD-10-CM | POA: Diagnosis not present

## 2014-03-27 DIAGNOSIS — L97311 Non-pressure chronic ulcer of right ankle limited to breakdown of skin: Secondary | ICD-10-CM | POA: Diagnosis not present

## 2014-03-27 DIAGNOSIS — Z794 Long term (current) use of insulin: Secondary | ICD-10-CM | POA: Diagnosis not present

## 2014-03-27 DIAGNOSIS — F419 Anxiety disorder, unspecified: Secondary | ICD-10-CM | POA: Diagnosis not present

## 2014-03-27 DIAGNOSIS — E119 Type 2 diabetes mellitus without complications: Secondary | ICD-10-CM | POA: Diagnosis not present

## 2014-03-27 DIAGNOSIS — F329 Major depressive disorder, single episode, unspecified: Secondary | ICD-10-CM | POA: Diagnosis not present

## 2014-03-27 DIAGNOSIS — G4700 Insomnia, unspecified: Secondary | ICD-10-CM | POA: Diagnosis not present

## 2014-03-30 ENCOUNTER — Encounter (HOSPITAL_COMMUNITY): Payer: Self-pay | Admitting: Urology

## 2014-03-31 DIAGNOSIS — E119 Type 2 diabetes mellitus without complications: Secondary | ICD-10-CM | POA: Diagnosis not present

## 2014-03-31 DIAGNOSIS — Z794 Long term (current) use of insulin: Secondary | ICD-10-CM | POA: Diagnosis not present

## 2014-03-31 DIAGNOSIS — L97311 Non-pressure chronic ulcer of right ankle limited to breakdown of skin: Secondary | ICD-10-CM | POA: Diagnosis not present

## 2014-03-31 DIAGNOSIS — I872 Venous insufficiency (chronic) (peripheral): Secondary | ICD-10-CM | POA: Diagnosis not present

## 2014-04-03 DIAGNOSIS — I872 Venous insufficiency (chronic) (peripheral): Secondary | ICD-10-CM | POA: Diagnosis not present

## 2014-04-03 DIAGNOSIS — Z794 Long term (current) use of insulin: Secondary | ICD-10-CM | POA: Diagnosis not present

## 2014-04-03 DIAGNOSIS — L97311 Non-pressure chronic ulcer of right ankle limited to breakdown of skin: Secondary | ICD-10-CM | POA: Diagnosis not present

## 2014-04-03 DIAGNOSIS — E119 Type 2 diabetes mellitus without complications: Secondary | ICD-10-CM | POA: Diagnosis not present

## 2014-04-04 DIAGNOSIS — M0520 Rheumatoid vasculitis with rheumatoid arthritis of unspecified site: Secondary | ICD-10-CM | POA: Diagnosis not present

## 2014-04-04 DIAGNOSIS — I70235 Atherosclerosis of native arteries of right leg with ulceration of other part of foot: Secondary | ICD-10-CM | POA: Diagnosis not present

## 2014-04-04 DIAGNOSIS — E11622 Type 2 diabetes mellitus with other skin ulcer: Secondary | ICD-10-CM | POA: Diagnosis not present

## 2014-04-04 DIAGNOSIS — I87311 Chronic venous hypertension (idiopathic) with ulcer of right lower extremity: Secondary | ICD-10-CM | POA: Diagnosis not present

## 2014-04-04 DIAGNOSIS — L97319 Non-pressure chronic ulcer of right ankle with unspecified severity: Secondary | ICD-10-CM | POA: Diagnosis not present

## 2014-04-04 DIAGNOSIS — E11621 Type 2 diabetes mellitus with foot ulcer: Secondary | ICD-10-CM | POA: Diagnosis not present

## 2014-04-04 DIAGNOSIS — S91101A Unspecified open wound of right great toe without damage to nail, initial encounter: Secondary | ICD-10-CM | POA: Diagnosis not present

## 2014-04-04 DIAGNOSIS — S91104A Unspecified open wound of right lesser toe(s) without damage to nail, initial encounter: Secondary | ICD-10-CM | POA: Diagnosis not present

## 2014-04-04 DIAGNOSIS — L97321 Non-pressure chronic ulcer of left ankle limited to breakdown of skin: Secondary | ICD-10-CM | POA: Diagnosis not present

## 2014-04-05 DIAGNOSIS — S91104A Unspecified open wound of right lesser toe(s) without damage to nail, initial encounter: Secondary | ICD-10-CM | POA: Diagnosis not present

## 2014-04-05 DIAGNOSIS — L97319 Non-pressure chronic ulcer of right ankle with unspecified severity: Secondary | ICD-10-CM | POA: Diagnosis not present

## 2014-04-05 DIAGNOSIS — E11621 Type 2 diabetes mellitus with foot ulcer: Secondary | ICD-10-CM | POA: Diagnosis not present

## 2014-04-05 DIAGNOSIS — S91101A Unspecified open wound of right great toe without damage to nail, initial encounter: Secondary | ICD-10-CM | POA: Diagnosis not present

## 2014-04-05 DIAGNOSIS — E11622 Type 2 diabetes mellitus with other skin ulcer: Secondary | ICD-10-CM | POA: Diagnosis not present

## 2014-04-06 DIAGNOSIS — L97311 Non-pressure chronic ulcer of right ankle limited to breakdown of skin: Secondary | ICD-10-CM | POA: Diagnosis not present

## 2014-04-06 DIAGNOSIS — E119 Type 2 diabetes mellitus without complications: Secondary | ICD-10-CM | POA: Diagnosis not present

## 2014-04-06 DIAGNOSIS — I872 Venous insufficiency (chronic) (peripheral): Secondary | ICD-10-CM | POA: Diagnosis not present

## 2014-04-06 DIAGNOSIS — Z794 Long term (current) use of insulin: Secondary | ICD-10-CM | POA: Diagnosis not present

## 2014-04-12 DIAGNOSIS — L97311 Non-pressure chronic ulcer of right ankle limited to breakdown of skin: Secondary | ICD-10-CM | POA: Diagnosis not present

## 2014-04-12 DIAGNOSIS — I872 Venous insufficiency (chronic) (peripheral): Secondary | ICD-10-CM | POA: Diagnosis not present

## 2014-04-12 DIAGNOSIS — E119 Type 2 diabetes mellitus without complications: Secondary | ICD-10-CM | POA: Diagnosis not present

## 2014-04-12 DIAGNOSIS — Z794 Long term (current) use of insulin: Secondary | ICD-10-CM | POA: Diagnosis not present

## 2014-04-14 DIAGNOSIS — E119 Type 2 diabetes mellitus without complications: Secondary | ICD-10-CM | POA: Diagnosis not present

## 2014-04-14 DIAGNOSIS — Z794 Long term (current) use of insulin: Secondary | ICD-10-CM | POA: Diagnosis not present

## 2014-04-14 DIAGNOSIS — I872 Venous insufficiency (chronic) (peripheral): Secondary | ICD-10-CM | POA: Diagnosis not present

## 2014-04-14 DIAGNOSIS — L97311 Non-pressure chronic ulcer of right ankle limited to breakdown of skin: Secondary | ICD-10-CM | POA: Diagnosis not present

## 2014-04-17 DIAGNOSIS — I872 Venous insufficiency (chronic) (peripheral): Secondary | ICD-10-CM | POA: Diagnosis not present

## 2014-04-17 DIAGNOSIS — E119 Type 2 diabetes mellitus without complications: Secondary | ICD-10-CM | POA: Diagnosis not present

## 2014-04-17 DIAGNOSIS — L97311 Non-pressure chronic ulcer of right ankle limited to breakdown of skin: Secondary | ICD-10-CM | POA: Diagnosis not present

## 2014-04-17 DIAGNOSIS — Z794 Long term (current) use of insulin: Secondary | ICD-10-CM | POA: Diagnosis not present

## 2014-04-18 DIAGNOSIS — L97311 Non-pressure chronic ulcer of right ankle limited to breakdown of skin: Secondary | ICD-10-CM | POA: Diagnosis not present

## 2014-04-18 DIAGNOSIS — M0520 Rheumatoid vasculitis with rheumatoid arthritis of unspecified site: Secondary | ICD-10-CM | POA: Diagnosis not present

## 2014-04-18 DIAGNOSIS — L539 Erythematous condition, unspecified: Secondary | ICD-10-CM | POA: Diagnosis not present

## 2014-04-18 DIAGNOSIS — E11621 Type 2 diabetes mellitus with foot ulcer: Secondary | ICD-10-CM | POA: Diagnosis not present

## 2014-04-18 DIAGNOSIS — I96 Gangrene, not elsewhere classified: Secondary | ICD-10-CM | POA: Diagnosis not present

## 2014-04-18 DIAGNOSIS — E11622 Type 2 diabetes mellitus with other skin ulcer: Secondary | ICD-10-CM | POA: Diagnosis not present

## 2014-04-18 DIAGNOSIS — I70235 Atherosclerosis of native arteries of right leg with ulceration of other part of foot: Secondary | ICD-10-CM | POA: Diagnosis not present

## 2014-04-18 DIAGNOSIS — L97519 Non-pressure chronic ulcer of other part of right foot with unspecified severity: Secondary | ICD-10-CM | POA: Diagnosis not present

## 2014-04-18 DIAGNOSIS — L97511 Non-pressure chronic ulcer of other part of right foot limited to breakdown of skin: Secondary | ICD-10-CM | POA: Diagnosis not present

## 2014-04-19 DIAGNOSIS — I872 Venous insufficiency (chronic) (peripheral): Secondary | ICD-10-CM | POA: Diagnosis not present

## 2014-04-19 DIAGNOSIS — E119 Type 2 diabetes mellitus without complications: Secondary | ICD-10-CM | POA: Diagnosis not present

## 2014-04-19 DIAGNOSIS — Z794 Long term (current) use of insulin: Secondary | ICD-10-CM | POA: Diagnosis not present

## 2014-04-19 DIAGNOSIS — L97311 Non-pressure chronic ulcer of right ankle limited to breakdown of skin: Secondary | ICD-10-CM | POA: Diagnosis not present

## 2014-04-20 DIAGNOSIS — E119 Type 2 diabetes mellitus without complications: Secondary | ICD-10-CM | POA: Diagnosis not present

## 2014-04-20 DIAGNOSIS — I872 Venous insufficiency (chronic) (peripheral): Secondary | ICD-10-CM | POA: Diagnosis not present

## 2014-04-20 DIAGNOSIS — L97311 Non-pressure chronic ulcer of right ankle limited to breakdown of skin: Secondary | ICD-10-CM | POA: Diagnosis not present

## 2014-04-20 DIAGNOSIS — Z794 Long term (current) use of insulin: Secondary | ICD-10-CM | POA: Diagnosis not present

## 2014-04-24 DIAGNOSIS — I872 Venous insufficiency (chronic) (peripheral): Secondary | ICD-10-CM | POA: Diagnosis not present

## 2014-04-24 DIAGNOSIS — E1069 Type 1 diabetes mellitus with other specified complication: Secondary | ICD-10-CM | POA: Diagnosis not present

## 2014-04-24 DIAGNOSIS — F419 Anxiety disorder, unspecified: Secondary | ICD-10-CM | POA: Diagnosis not present

## 2014-04-24 DIAGNOSIS — I70261 Atherosclerosis of native arteries of extremities with gangrene, right leg: Secondary | ICD-10-CM | POA: Diagnosis not present

## 2014-04-24 DIAGNOSIS — I96 Gangrene, not elsewhere classified: Secondary | ICD-10-CM | POA: Diagnosis not present

## 2014-04-24 DIAGNOSIS — L97311 Non-pressure chronic ulcer of right ankle limited to breakdown of skin: Secondary | ICD-10-CM | POA: Diagnosis not present

## 2014-04-24 DIAGNOSIS — I739 Peripheral vascular disease, unspecified: Secondary | ICD-10-CM | POA: Diagnosis not present

## 2014-04-24 DIAGNOSIS — L97419 Non-pressure chronic ulcer of right heel and midfoot with unspecified severity: Secondary | ICD-10-CM | POA: Diagnosis not present

## 2014-04-24 DIAGNOSIS — E1152 Type 2 diabetes mellitus with diabetic peripheral angiopathy with gangrene: Secondary | ICD-10-CM | POA: Diagnosis not present

## 2014-04-24 DIAGNOSIS — Z0289 Encounter for other administrative examinations: Secondary | ICD-10-CM | POA: Diagnosis not present

## 2014-04-24 DIAGNOSIS — D509 Iron deficiency anemia, unspecified: Secondary | ICD-10-CM | POA: Diagnosis not present

## 2014-04-24 DIAGNOSIS — M1990 Unspecified osteoarthritis, unspecified site: Secondary | ICD-10-CM | POA: Diagnosis not present

## 2014-04-24 DIAGNOSIS — Z794 Long term (current) use of insulin: Secondary | ICD-10-CM | POA: Diagnosis not present

## 2014-04-24 DIAGNOSIS — I10 Essential (primary) hypertension: Secondary | ICD-10-CM | POA: Diagnosis not present

## 2014-04-24 DIAGNOSIS — S91301A Unspecified open wound, right foot, initial encounter: Secondary | ICD-10-CM | POA: Diagnosis not present

## 2014-04-24 DIAGNOSIS — E119 Type 2 diabetes mellitus without complications: Secondary | ICD-10-CM | POA: Diagnosis not present

## 2014-04-24 DIAGNOSIS — N400 Enlarged prostate without lower urinary tract symptoms: Secondary | ICD-10-CM | POA: Diagnosis not present

## 2014-04-24 DIAGNOSIS — F0390 Unspecified dementia without behavioral disturbance: Secondary | ICD-10-CM | POA: Diagnosis not present

## 2014-04-25 DIAGNOSIS — F319 Bipolar disorder, unspecified: Secondary | ICD-10-CM | POA: Diagnosis not present

## 2014-04-25 DIAGNOSIS — E119 Type 2 diabetes mellitus without complications: Secondary | ICD-10-CM | POA: Diagnosis not present

## 2014-04-25 DIAGNOSIS — Z794 Long term (current) use of insulin: Secondary | ICD-10-CM | POA: Diagnosis not present

## 2014-04-25 DIAGNOSIS — F0390 Unspecified dementia without behavioral disturbance: Secondary | ICD-10-CM | POA: Diagnosis not present

## 2014-04-25 DIAGNOSIS — I10 Essential (primary) hypertension: Secondary | ICD-10-CM | POA: Diagnosis not present

## 2014-04-25 DIAGNOSIS — L97519 Non-pressure chronic ulcer of other part of right foot with unspecified severity: Secondary | ICD-10-CM | POA: Diagnosis not present

## 2014-04-25 DIAGNOSIS — F419 Anxiety disorder, unspecified: Secondary | ICD-10-CM | POA: Diagnosis not present

## 2014-04-25 DIAGNOSIS — N400 Enlarged prostate without lower urinary tract symptoms: Secondary | ICD-10-CM | POA: Diagnosis not present

## 2014-04-25 DIAGNOSIS — E1065 Type 1 diabetes mellitus with hyperglycemia: Secondary | ICD-10-CM | POA: Diagnosis not present

## 2014-04-25 DIAGNOSIS — Z87891 Personal history of nicotine dependence: Secondary | ICD-10-CM | POA: Diagnosis not present

## 2014-04-25 DIAGNOSIS — R269 Unspecified abnormalities of gait and mobility: Secondary | ICD-10-CM | POA: Diagnosis not present

## 2014-04-25 DIAGNOSIS — F339 Major depressive disorder, recurrent, unspecified: Secondary | ICD-10-CM | POA: Diagnosis not present

## 2014-04-25 DIAGNOSIS — M1990 Unspecified osteoarthritis, unspecified site: Secondary | ICD-10-CM | POA: Diagnosis not present

## 2014-04-25 DIAGNOSIS — E1152 Type 2 diabetes mellitus with diabetic peripheral angiopathy with gangrene: Secondary | ICD-10-CM | POA: Diagnosis not present

## 2014-04-25 DIAGNOSIS — D509 Iron deficiency anemia, unspecified: Secondary | ICD-10-CM | POA: Diagnosis not present

## 2014-04-25 DIAGNOSIS — M869 Osteomyelitis, unspecified: Secondary | ICD-10-CM | POA: Diagnosis not present

## 2014-04-25 DIAGNOSIS — E11621 Type 2 diabetes mellitus with foot ulcer: Secondary | ICD-10-CM | POA: Diagnosis not present

## 2014-04-25 DIAGNOSIS — L97419 Non-pressure chronic ulcer of right heel and midfoot with unspecified severity: Secondary | ICD-10-CM | POA: Diagnosis not present

## 2014-04-25 DIAGNOSIS — I739 Peripheral vascular disease, unspecified: Secondary | ICD-10-CM | POA: Diagnosis not present

## 2014-04-25 DIAGNOSIS — L03119 Cellulitis of unspecified part of limb: Secondary | ICD-10-CM | POA: Diagnosis not present

## 2014-04-25 DIAGNOSIS — E10621 Type 1 diabetes mellitus with foot ulcer: Secondary | ICD-10-CM | POA: Diagnosis not present

## 2014-04-25 DIAGNOSIS — I70261 Atherosclerosis of native arteries of extremities with gangrene, right leg: Secondary | ICD-10-CM | POA: Diagnosis not present

## 2014-04-25 DIAGNOSIS — I96 Gangrene, not elsewhere classified: Secondary | ICD-10-CM | POA: Diagnosis not present

## 2014-04-28 DIAGNOSIS — D62 Acute posthemorrhagic anemia: Secondary | ICD-10-CM | POA: Diagnosis not present

## 2014-04-28 DIAGNOSIS — R269 Unspecified abnormalities of gait and mobility: Secondary | ICD-10-CM | POA: Diagnosis not present

## 2014-04-28 DIAGNOSIS — E43 Unspecified severe protein-calorie malnutrition: Secondary | ICD-10-CM | POA: Diagnosis not present

## 2014-04-28 DIAGNOSIS — F339 Major depressive disorder, recurrent, unspecified: Secondary | ICD-10-CM | POA: Diagnosis not present

## 2014-04-28 DIAGNOSIS — I771 Stricture of artery: Secondary | ICD-10-CM | POA: Diagnosis not present

## 2014-04-28 DIAGNOSIS — I96 Gangrene, not elsewhere classified: Secondary | ICD-10-CM | POA: Diagnosis not present

## 2014-04-28 DIAGNOSIS — E1165 Type 2 diabetes mellitus with hyperglycemia: Secondary | ICD-10-CM | POA: Diagnosis not present

## 2014-04-28 DIAGNOSIS — D649 Anemia, unspecified: Secondary | ICD-10-CM | POA: Diagnosis present

## 2014-04-28 DIAGNOSIS — E1065 Type 1 diabetes mellitus with hyperglycemia: Secondary | ICD-10-CM | POA: Diagnosis not present

## 2014-04-28 DIAGNOSIS — M869 Osteomyelitis, unspecified: Secondary | ICD-10-CM | POA: Diagnosis not present

## 2014-04-28 DIAGNOSIS — E780 Pure hypercholesterolemia: Secondary | ICD-10-CM | POA: Diagnosis present

## 2014-04-28 DIAGNOSIS — Z79899 Other long term (current) drug therapy: Secondary | ICD-10-CM | POA: Diagnosis not present

## 2014-04-28 DIAGNOSIS — Z888 Allergy status to other drugs, medicaments and biological substances status: Secondary | ICD-10-CM | POA: Diagnosis not present

## 2014-04-28 DIAGNOSIS — M1990 Unspecified osteoarthritis, unspecified site: Secondary | ICD-10-CM | POA: Diagnosis not present

## 2014-04-28 DIAGNOSIS — Z7982 Long term (current) use of aspirin: Secondary | ICD-10-CM | POA: Diagnosis not present

## 2014-04-28 DIAGNOSIS — E11622 Type 2 diabetes mellitus with other skin ulcer: Secondary | ICD-10-CM | POA: Diagnosis not present

## 2014-04-28 DIAGNOSIS — M069 Rheumatoid arthritis, unspecified: Secondary | ICD-10-CM | POA: Diagnosis present

## 2014-04-28 DIAGNOSIS — I10 Essential (primary) hypertension: Secondary | ICD-10-CM | POA: Diagnosis not present

## 2014-04-28 DIAGNOSIS — F0390 Unspecified dementia without behavioral disturbance: Secondary | ICD-10-CM | POA: Diagnosis not present

## 2014-04-28 DIAGNOSIS — I70235 Atherosclerosis of native arteries of right leg with ulceration of other part of foot: Secondary | ICD-10-CM | POA: Diagnosis not present

## 2014-04-28 DIAGNOSIS — Z6822 Body mass index (BMI) 22.0-22.9, adult: Secondary | ICD-10-CM | POA: Diagnosis not present

## 2014-04-28 DIAGNOSIS — I70209 Unspecified atherosclerosis of native arteries of extremities, unspecified extremity: Secondary | ICD-10-CM | POA: Diagnosis not present

## 2014-04-28 DIAGNOSIS — I739 Peripheral vascular disease, unspecified: Secondary | ICD-10-CM | POA: Diagnosis not present

## 2014-04-28 DIAGNOSIS — F419 Anxiety disorder, unspecified: Secondary | ICD-10-CM | POA: Diagnosis not present

## 2014-04-28 DIAGNOSIS — D509 Iron deficiency anemia, unspecified: Secondary | ICD-10-CM | POA: Diagnosis not present

## 2014-04-28 DIAGNOSIS — F29 Unspecified psychosis not due to a substance or known physiological condition: Secondary | ICD-10-CM | POA: Diagnosis not present

## 2014-04-28 DIAGNOSIS — Z87891 Personal history of nicotine dependence: Secondary | ICD-10-CM | POA: Diagnosis not present

## 2014-04-28 DIAGNOSIS — L97819 Non-pressure chronic ulcer of other part of right lower leg with unspecified severity: Secondary | ICD-10-CM | POA: Diagnosis not present

## 2014-04-28 DIAGNOSIS — F329 Major depressive disorder, single episode, unspecified: Secondary | ICD-10-CM | POA: Diagnosis not present

## 2014-04-28 DIAGNOSIS — L97519 Non-pressure chronic ulcer of other part of right foot with unspecified severity: Secondary | ICD-10-CM | POA: Diagnosis present

## 2014-04-28 DIAGNOSIS — I70261 Atherosclerosis of native arteries of extremities with gangrene, right leg: Secondary | ICD-10-CM | POA: Diagnosis not present

## 2014-04-28 DIAGNOSIS — E11621 Type 2 diabetes mellitus with foot ulcer: Secondary | ICD-10-CM | POA: Diagnosis not present

## 2014-04-28 DIAGNOSIS — Z8249 Family history of ischemic heart disease and other diseases of the circulatory system: Secondary | ICD-10-CM | POA: Diagnosis not present

## 2014-04-28 DIAGNOSIS — R0600 Dyspnea, unspecified: Secondary | ICD-10-CM | POA: Diagnosis not present

## 2014-04-28 DIAGNOSIS — L97511 Non-pressure chronic ulcer of other part of right foot limited to breakdown of skin: Secondary | ICD-10-CM | POA: Diagnosis not present

## 2014-04-28 DIAGNOSIS — L539 Erythematous condition, unspecified: Secondary | ICD-10-CM | POA: Diagnosis not present

## 2014-04-28 DIAGNOSIS — E785 Hyperlipidemia, unspecified: Secondary | ICD-10-CM | POA: Diagnosis not present

## 2014-04-28 DIAGNOSIS — E10621 Type 1 diabetes mellitus with foot ulcer: Secondary | ICD-10-CM | POA: Diagnosis not present

## 2014-04-28 DIAGNOSIS — I87311 Chronic venous hypertension (idiopathic) with ulcer of right lower extremity: Secondary | ICD-10-CM | POA: Diagnosis not present

## 2014-04-28 DIAGNOSIS — L97311 Non-pressure chronic ulcer of right ankle limited to breakdown of skin: Secondary | ICD-10-CM | POA: Diagnosis not present

## 2014-04-28 DIAGNOSIS — E119 Type 2 diabetes mellitus without complications: Secondary | ICD-10-CM | POA: Diagnosis not present

## 2014-04-28 DIAGNOSIS — F319 Bipolar disorder, unspecified: Secondary | ICD-10-CM | POA: Diagnosis present

## 2014-05-03 DIAGNOSIS — I70235 Atherosclerosis of native arteries of right leg with ulceration of other part of foot: Secondary | ICD-10-CM | POA: Diagnosis not present

## 2014-05-03 DIAGNOSIS — E11622 Type 2 diabetes mellitus with other skin ulcer: Secondary | ICD-10-CM | POA: Diagnosis not present

## 2014-05-03 DIAGNOSIS — L97511 Non-pressure chronic ulcer of other part of right foot limited to breakdown of skin: Secondary | ICD-10-CM | POA: Diagnosis not present

## 2014-05-03 DIAGNOSIS — L539 Erythematous condition, unspecified: Secondary | ICD-10-CM | POA: Diagnosis not present

## 2014-05-03 DIAGNOSIS — I96 Gangrene, not elsewhere classified: Secondary | ICD-10-CM | POA: Diagnosis not present

## 2014-05-03 DIAGNOSIS — E11621 Type 2 diabetes mellitus with foot ulcer: Secondary | ICD-10-CM | POA: Diagnosis not present

## 2014-05-03 DIAGNOSIS — L97519 Non-pressure chronic ulcer of other part of right foot with unspecified severity: Secondary | ICD-10-CM | POA: Diagnosis not present

## 2014-05-03 DIAGNOSIS — L97311 Non-pressure chronic ulcer of right ankle limited to breakdown of skin: Secondary | ICD-10-CM | POA: Diagnosis not present

## 2014-05-08 DIAGNOSIS — L97519 Non-pressure chronic ulcer of other part of right foot with unspecified severity: Secondary | ICD-10-CM | POA: Diagnosis not present

## 2014-05-08 DIAGNOSIS — I771 Stricture of artery: Secondary | ICD-10-CM | POA: Diagnosis not present

## 2014-05-10 DIAGNOSIS — L97511 Non-pressure chronic ulcer of other part of right foot limited to breakdown of skin: Secondary | ICD-10-CM | POA: Diagnosis not present

## 2014-05-10 DIAGNOSIS — L97519 Non-pressure chronic ulcer of other part of right foot with unspecified severity: Secondary | ICD-10-CM | POA: Diagnosis not present

## 2014-05-10 DIAGNOSIS — I87311 Chronic venous hypertension (idiopathic) with ulcer of right lower extremity: Secondary | ICD-10-CM | POA: Diagnosis not present

## 2014-05-10 DIAGNOSIS — L97311 Non-pressure chronic ulcer of right ankle limited to breakdown of skin: Secondary | ICD-10-CM | POA: Diagnosis not present

## 2014-05-10 DIAGNOSIS — L539 Erythematous condition, unspecified: Secondary | ICD-10-CM | POA: Diagnosis not present

## 2014-05-10 DIAGNOSIS — E11622 Type 2 diabetes mellitus with other skin ulcer: Secondary | ICD-10-CM | POA: Diagnosis not present

## 2014-05-10 DIAGNOSIS — I70235 Atherosclerosis of native arteries of right leg with ulceration of other part of foot: Secondary | ICD-10-CM | POA: Diagnosis not present

## 2014-05-10 DIAGNOSIS — I96 Gangrene, not elsewhere classified: Secondary | ICD-10-CM | POA: Diagnosis not present

## 2014-05-17 DIAGNOSIS — E11621 Type 2 diabetes mellitus with foot ulcer: Secondary | ICD-10-CM | POA: Diagnosis not present

## 2014-05-17 DIAGNOSIS — I10 Essential (primary) hypertension: Secondary | ICD-10-CM | POA: Diagnosis not present

## 2014-05-17 DIAGNOSIS — L97311 Non-pressure chronic ulcer of right ankle limited to breakdown of skin: Secondary | ICD-10-CM | POA: Diagnosis not present

## 2014-05-17 DIAGNOSIS — I70235 Atherosclerosis of native arteries of right leg with ulceration of other part of foot: Secondary | ICD-10-CM | POA: Diagnosis not present

## 2014-05-17 DIAGNOSIS — E785 Hyperlipidemia, unspecified: Secondary | ICD-10-CM | POA: Diagnosis not present

## 2014-05-17 DIAGNOSIS — E119 Type 2 diabetes mellitus without complications: Secondary | ICD-10-CM | POA: Diagnosis not present

## 2014-05-17 DIAGNOSIS — I96 Gangrene, not elsewhere classified: Secondary | ICD-10-CM | POA: Diagnosis not present

## 2014-05-17 DIAGNOSIS — E11622 Type 2 diabetes mellitus with other skin ulcer: Secondary | ICD-10-CM | POA: Diagnosis not present

## 2014-05-17 DIAGNOSIS — R0600 Dyspnea, unspecified: Secondary | ICD-10-CM | POA: Diagnosis not present

## 2014-05-17 DIAGNOSIS — L97511 Non-pressure chronic ulcer of other part of right foot limited to breakdown of skin: Secondary | ICD-10-CM | POA: Diagnosis not present

## 2014-05-17 DIAGNOSIS — D509 Iron deficiency anemia, unspecified: Secondary | ICD-10-CM | POA: Diagnosis not present

## 2014-05-17 DIAGNOSIS — L97519 Non-pressure chronic ulcer of other part of right foot with unspecified severity: Secondary | ICD-10-CM | POA: Diagnosis not present

## 2014-05-24 DIAGNOSIS — L97311 Non-pressure chronic ulcer of right ankle limited to breakdown of skin: Secondary | ICD-10-CM | POA: Diagnosis not present

## 2014-05-24 DIAGNOSIS — I96 Gangrene, not elsewhere classified: Secondary | ICD-10-CM | POA: Diagnosis not present

## 2014-05-24 DIAGNOSIS — I70235 Atherosclerosis of native arteries of right leg with ulceration of other part of foot: Secondary | ICD-10-CM | POA: Diagnosis not present

## 2014-05-24 DIAGNOSIS — L97519 Non-pressure chronic ulcer of other part of right foot with unspecified severity: Secondary | ICD-10-CM | POA: Diagnosis not present

## 2014-05-24 DIAGNOSIS — I10 Essential (primary) hypertension: Secondary | ICD-10-CM | POA: Diagnosis not present

## 2014-05-24 DIAGNOSIS — L97511 Non-pressure chronic ulcer of other part of right foot limited to breakdown of skin: Secondary | ICD-10-CM | POA: Diagnosis not present

## 2014-05-24 DIAGNOSIS — I87311 Chronic venous hypertension (idiopathic) with ulcer of right lower extremity: Secondary | ICD-10-CM | POA: Diagnosis not present

## 2014-05-24 DIAGNOSIS — E11621 Type 2 diabetes mellitus with foot ulcer: Secondary | ICD-10-CM | POA: Diagnosis not present

## 2014-05-25 ENCOUNTER — Encounter: Payer: Self-pay | Admitting: Vascular Surgery

## 2014-05-26 ENCOUNTER — Other Ambulatory Visit: Payer: Self-pay

## 2014-05-26 ENCOUNTER — Ambulatory Visit (INDEPENDENT_AMBULATORY_CARE_PROVIDER_SITE_OTHER): Payer: Medicare Other | Admitting: Vascular Surgery

## 2014-05-26 ENCOUNTER — Encounter: Payer: Self-pay | Admitting: Vascular Surgery

## 2014-05-26 VITALS — BP 146/56 | HR 80 | Resp 18 | Ht 73.0 in | Wt 180.0 lb

## 2014-05-26 DIAGNOSIS — I70209 Unspecified atherosclerosis of native arteries of extremities, unspecified extremity: Secondary | ICD-10-CM

## 2014-05-26 NOTE — Progress Notes (Signed)
[facility] [place]  Patient name: Terry Green MRN: [id number] DOB: 09/06/1940 Sex: male  REASON FOR CONSULT: Gangrene of the right foot  HPI: Terry Green is a 74 y.o. male who apparently developed wounds on the toes of his right foot over a month ago. He was seen at [facility]. He underwent a CT angiogram the results of which are described below. According to his daughter, he then underwent attempted balloon angioplasty and stenting of the anterior tibial artery that apparently was unsuccessful. Consideration was being given to bypass however he was felt to be at high-risk and therefore sent for a second opinion.  He resides in a skilled nursing facility and his activity is very limited. I do not get any clear-cut history of claudication or rest pain. He denies fever or chills.  Past Medical History  Diagnosis Date  . Hypertension   . Hypercholesteremia   . Diabetes mellitus without complication   . Arthritis     rheumatoid  . Peripheral vascular disease    Family History  Problem Relation Age of Onset  . Heart disease Father    SOCIAL HISTORY: History  Substance Use Topics  . Smoking status: Former Smoker -- 0.25 packs/day for 25 years    Types: Cigarettes    Quit date: 05/25/2012  . Smokeless tobacco: Never Used  . Alcohol Use: No   Allergies  Allergen Reactions  . Lisinopril Swelling   Current Outpatient Prescriptions  Medication Sig Dispense Refill  . amLODipine (NORVASC) 10 MG tablet Take 10 mg by mouth daily.    Marland Kitchen aspirin 81 MG chewable tablet Chew 81 mg by mouth daily.    . carvedilol (COREG) 12.5 MG tablet Take 12.5 mg by mouth 2 (two) times daily with a meal.    . memantine (NAMENDA) 10 MG tablet Take 10 mg by mouth 2 (two) times daily.    . metFORMIN (GLUCOPHAGE) 1000 MG tablet Take 1,000 mg by mouth daily.    . Multiple Vitamins-Minerals (MULTIVITAMINS THER. W/MINERALS) TABS Take 1 tablet by mouth daily.      Marland Kitchen oxycodone (OXY-IR) 5 MG capsule Take 5 mg by mouth 2 (two) times daily as needed.     No current facility-administered medications for this visit.   REVIEW OF SYSTEMS: Arly.Keller ] denotes positive finding; [  ] denotes negative finding  CARDIOVASCULAR:  [ ]  chest pain   [ ]  chest pressure   [ ]  palpitations   [ ]  orthopnea   ] dyspnea on exertion   [ ]  claudication   [ ]  rest pain   [ ]  DVT   [ ]  phlebitis PULMONARY:   [ ]  productive cough   [ ]  asthma   [ ]  wheezing NEUROLOGIC:   ] weakness Generalized [ ]  paresthesias  [ ]  aphasia  [ ]  amaurosis  [ ]  dizziness HEMATOLOGIC:   [ ]  bleeding problems   [ ]  clotting disorders MUSCULOSKELETAL:  ] joint pain   [ ]  joint swelling [ ]  leg swelling GASTROINTESTINAL: [ ]   blood in stool  [ ]   hematemesis GENITOURINARY:  [ ]   dysuria  [ ]   hematuria PSYCHIATRIC:  [ ]  history of major depression INTEGUMENTARY:  [ ]  rashes  ] ulcers CONSTITUTIONAL:  [ ]  fever   [ ]  chills  PHYSICAL EXAM: Filed Vitals:   05/26/14 1551  BP: 146/56  Pulse: 80  Resp: 18  Height: 6\' 1"  (1.854  m)  Weight: 180 lb (81.647 kg)   Body mass index is 23.75 kg/(m^2). GENERAL: The patient is a well-nourished male, in no acute distress. The vital signs are documented above. CARDIOVASCULAR: There is a regular rate and rhythm. I do not detect carotid bruits. He has palpable femoral pulses. I cannot palpate a popliteal or pedal pulse on the right. On the left side he has a palpable popliteal pulse. I cannot palpate pedal pulses on the left. PULMONARY: There is good air exchange bilaterally without wheezing or rales. ABDOMEN: Soft and non-tender with normal pitched bowel sounds.  MUSCULOSKELETAL: There are no major deformities. NEUROLOGIC: No focal weakness or paresthesias are detected. SKIN: He has extensive gangrene involving the right first third fourth and fifth toes. In addition there are gangrenous changes on the second toe. He also has a wound over the lateral  malleolus which is draining and appears to track to the bone. PSYCHIATRIC: The patient has a normal affect.  DATA:  CTA done at [facility] on 04/25/2014 shows that the infrarenal aorta is widely patent. The proximal superficial femoral artery is patent on the right. There is a short segment moderate stenosis of the distal right superficial femoral artery. The right popliteal artery is occluded with reconstitution of the anterior tibial artery. There is severe disease of the right posterior tibial and peroneal artery.  I have reviewed the records that were sent with him from [facility]. He was treated at the wound care center they're but the wounds have progressed.  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 The CT angiogram shows that he might potentially be a candidate for a femoral to anterior tibial artery bypass although since that study was done he is apparently had attempted angioplasty and stenting of the anterior tibial artery which was not successful. I have explained that if we were going to consider attempted limb salvage, I would need to obtain an arteriogram to see if he still had a distal target on the right. This would be associated with significant risk. In addition, given the wound over the lateral malleolus I do not think that the foot will be salvageable and for this reason I think the safest course would be a primary below the knee amputation. If he were going to consider revascularization we would need preoperative cardiac clearance also. All things considered, I think the safest approach is below the knee amputation and the patient and his daughter are agreeable. They would like to have this done in [place]. The soonest I can schedule it is 06/06/2014. I have offered to let one  of my partners do this sooner however they would prefer to have me do it on the 22nd. In the meantime I've given instructions to the nursing facility to soak the foot daily in lukewarm dye also soaks and keep a dry dressing on the wound.   Xandra Laramee S [facility] [place] Beeper: [phone]

## 2014-05-31 ENCOUNTER — Encounter: Payer: Medicare Other | Admitting: Vascular Surgery

## 2014-06-05 ENCOUNTER — Encounter (HOSPITAL_COMMUNITY): Payer: Self-pay | Admitting: *Deleted

## 2014-06-05 MED ORDER — CHLORHEXIDINE GLUCONATE CLOTH 2 % EX PADS: 6.0000 | MEDICATED_PAD | Freq: Once | CUTANEOUS | Status: DC

## 2014-06-05 MED ORDER — DEXTROSE 5 % IV SOLN: 1.5000 g | INTRAVENOUS | Status: DC

## 2014-06-05 MED ORDER — SODIUM CHLORIDE 0.9 % IV SOLN: INTRAVENOUS | Status: DC

## 2014-06-06 ENCOUNTER — Inpatient Hospital Stay (HOSPITAL_COMMUNITY)
Admission: RE | Admit: 2014-06-06 | Discharge: 2014-06-09 | DRG: 239 | Disposition: A | Discharge status: Skilled Nursing Facility | Payer: Medicare Other | Source: Ambulatory Visit | Attending: Vascular Surgery | Admitting: Vascular Surgery

## 2014-06-06 ENCOUNTER — Encounter (HOSPITAL_COMMUNITY): Payer: Self-pay | Admitting: *Deleted

## 2014-06-06 ENCOUNTER — Inpatient Hospital Stay (HOSPITAL_COMMUNITY): Payer: Medicare Other | Admitting: Anesthesiology

## 2014-06-06 ENCOUNTER — Encounter (HOSPITAL_COMMUNITY)
Admission: RE | Disposition: A | Discharge status: Skilled Nursing Facility | Payer: Self-pay | Source: Ambulatory Visit | Attending: Vascular Surgery

## 2014-06-06 DIAGNOSIS — M25561 Pain in right knee: Secondary | ICD-10-CM | POA: Diagnosis not present

## 2014-06-06 DIAGNOSIS — Z7982 Long term (current) use of aspirin: Secondary | ICD-10-CM | POA: Diagnosis not present

## 2014-06-06 DIAGNOSIS — Z888 Allergy status to other drugs, medicaments and biological substances status: Secondary | ICD-10-CM

## 2014-06-06 DIAGNOSIS — I70261 Atherosclerosis of native arteries of extremities with gangrene, right leg: Secondary | ICD-10-CM | POA: Diagnosis not present

## 2014-06-06 DIAGNOSIS — I739 Peripheral vascular disease, unspecified: Secondary | ICD-10-CM | POA: Diagnosis present

## 2014-06-06 DIAGNOSIS — D509 Iron deficiency anemia, unspecified: Secondary | ICD-10-CM | POA: Diagnosis not present

## 2014-06-06 DIAGNOSIS — Z8249 Family history of ischemic heart disease and other diseases of the circulatory system: Secondary | ICD-10-CM | POA: Diagnosis not present

## 2014-06-06 DIAGNOSIS — L97519 Non-pressure chronic ulcer of other part of right foot with unspecified severity: Secondary | ICD-10-CM | POA: Diagnosis present

## 2014-06-06 DIAGNOSIS — D649 Anemia, unspecified: Secondary | ICD-10-CM | POA: Diagnosis present

## 2014-06-06 DIAGNOSIS — Z87891 Personal history of nicotine dependence: Secondary | ICD-10-CM

## 2014-06-06 DIAGNOSIS — F0390 Unspecified dementia without behavioral disturbance: Secondary | ICD-10-CM | POA: Diagnosis not present

## 2014-06-06 DIAGNOSIS — E780 Pure hypercholesterolemia: Secondary | ICD-10-CM | POA: Diagnosis present

## 2014-06-06 DIAGNOSIS — E43 Unspecified severe protein-calorie malnutrition: Secondary | ICD-10-CM | POA: Diagnosis not present

## 2014-06-06 DIAGNOSIS — Z6822 Body mass index (BMI) 22.0-22.9, adult: Secondary | ICD-10-CM | POA: Diagnosis not present

## 2014-06-06 DIAGNOSIS — R279 Unspecified lack of coordination: Secondary | ICD-10-CM | POA: Diagnosis not present

## 2014-06-06 DIAGNOSIS — F319 Bipolar disorder, unspecified: Secondary | ICD-10-CM | POA: Diagnosis present

## 2014-06-06 DIAGNOSIS — I10 Essential (primary) hypertension: Secondary | ICD-10-CM | POA: Diagnosis present

## 2014-06-06 DIAGNOSIS — Z79899 Other long term (current) drug therapy: Secondary | ICD-10-CM

## 2014-06-06 DIAGNOSIS — I96 Gangrene, not elsewhere classified: Secondary | ICD-10-CM | POA: Diagnosis not present

## 2014-06-06 DIAGNOSIS — D62 Acute posthemorrhagic anemia: Secondary | ICD-10-CM | POA: Diagnosis not present

## 2014-06-06 DIAGNOSIS — L97819 Non-pressure chronic ulcer of other part of right lower leg with unspecified severity: Secondary | ICD-10-CM | POA: Diagnosis not present

## 2014-06-06 DIAGNOSIS — Z89511 Acquired absence of right leg below knee: Secondary | ICD-10-CM | POA: Diagnosis not present

## 2014-06-06 DIAGNOSIS — E1165 Type 2 diabetes mellitus with hyperglycemia: Secondary | ICD-10-CM | POA: Diagnosis not present

## 2014-06-06 DIAGNOSIS — M069 Rheumatoid arthritis, unspecified: Secondary | ICD-10-CM | POA: Diagnosis present

## 2014-06-06 DIAGNOSIS — M869 Osteomyelitis, unspecified: Secondary | ICD-10-CM | POA: Diagnosis not present

## 2014-06-06 DIAGNOSIS — E1065 Type 1 diabetes mellitus with hyperglycemia: Secondary | ICD-10-CM | POA: Diagnosis not present

## 2014-06-06 DIAGNOSIS — M1990 Unspecified osteoarthritis, unspecified site: Secondary | ICD-10-CM | POA: Diagnosis not present

## 2014-06-06 DIAGNOSIS — S88011A Complete traumatic amputation at knee level, right lower leg, initial encounter: Secondary | ICD-10-CM | POA: Diagnosis not present

## 2014-06-06 HISTORY — DX: Type 2 diabetes mellitus without complications: E11.9

## 2014-06-06 HISTORY — DX: Rheumatoid arthritis, unspecified: M06.9

## 2014-06-06 HISTORY — PX: AMPUTATION: SHX166

## 2014-06-06 HISTORY — DX: Peripheral vascular disease, unspecified: I73.9

## 2014-06-06 HISTORY — PX: BELOW KNEE LEG AMPUTATION: SUR23

## 2014-06-06 HISTORY — DX: Bipolar disorder, unspecified: F31.9

## 2014-06-06 LAB — COMPREHENSIVE METABOLIC PANEL
ALBUMIN: 2.3 g/dL — AB (ref 3.5–5.2)
ALT: 11 U/L (ref 0–53)
AST: 14 U/L (ref 0–37)
Alkaline Phosphatase: 77 U/L (ref 39–117)
Anion gap: 9 (ref 5–15)
BUN: 11 mg/dL (ref 6–23)
CALCIUM: 9 mg/dL (ref 8.4–10.5)
CO2: 26 mmol/L (ref 19–32)
Chloride: 96 mmol/L (ref 96–112)
Creatinine, Ser: 0.71 mg/dL (ref 0.50–1.35)
GFR calc Af Amer: >90 mL/min (ref >90)
GFR calc non Af Amer: >90 mL/min (ref >90)
Glucose, Bld: 298 mg/dL — ABNORMAL HIGH (ref 70–99)
POTASSIUM: 4.4 mmol/L (ref 3.5–5.1)
Sodium: 131 mmol/L — ABNORMAL LOW (ref 135–145)
TOTAL PROTEIN: 8.4 g/dL — AB (ref 6.0–8.3)
Total Bilirubin: 0.6 mg/dL (ref 0.3–1.2)

## 2014-06-06 LAB — GLUCOSE, CAPILLARY
Glucose-Capillary: 286 mg/dL — ABNORMAL HIGH (ref 70–99)
Glucose-Capillary: 242 mg/dL — ABNORMAL HIGH (ref 70–99)
Glucose-Capillary: 233 mg/dL — ABNORMAL HIGH (ref 70–99)
Glucose-Capillary: 231 mg/dL — ABNORMAL HIGH (ref 70–99)
Glucose-Capillary: 213 mg/dL — ABNORMAL HIGH (ref 70–99)

## 2014-06-06 LAB — CBC
HCT: 26.4 % — ABNORMAL LOW (ref 39.0–52.0)
Hemoglobin: 8.2 g/dL — ABNORMAL LOW (ref 13.0–17.0)
MCH: 20.2 pg — AB (ref 26.0–34.0)
MCHC: 31.1 g/dL (ref 30.0–36.0)
MCV: 65.2 fL — ABNORMAL LOW (ref 78.0–100.0)
PLATELETS: 340 10*3/uL (ref 150–400)
RBC: 4.05 MIL/uL — ABNORMAL LOW (ref 4.22–5.81)
RDW: 17.7 % — AB (ref 11.5–15.5)
WBC: 12 10*3/uL — ABNORMAL HIGH (ref 4.0–10.5)

## 2014-06-06 SURGERY — AMPUTATION BELOW KNEE
Anesthesia: General | Laterality: Right

## 2014-06-06 MED ORDER — ONDANSETRON HCL 4 MG/2ML IJ SOLN
INTRAMUSCULAR | Status: DC | PRN
  Administered 2014-06-06: 4 mg via INTRAVENOUS

## 2014-06-06 MED ORDER — ACETAMINOPHEN 325 MG PO TABS
325.0000 mg | ORAL_TABLET | ORAL | Status: DC | PRN
Start: 2014-06-06 — End: 2014-06-09

## 2014-06-06 MED ORDER — GUAIFENESIN-DM 100-10 MG/5ML PO SYRP
15.0000 mL | ORAL_SOLUTION | ORAL | Status: DC | PRN
Start: 2014-06-06 — End: 2014-06-09

## 2014-06-06 MED ORDER — ACETAMINOPHEN 650 MG RE SUPP
325.0000 mg | RECTAL | Status: DC | PRN
Start: 2014-06-06 — End: 2014-06-09

## 2014-06-06 MED ORDER — BACITRACIN ZINC 500 UNIT/GM EX OINT
TOPICAL_OINTMENT | CUTANEOUS | Status: DC | PRN
  Administered 2014-06-06: 1 via TOPICAL

## 2014-06-06 MED ORDER — OXYCODONE HCL 5 MG PO TABS
5.0000 mg | ORAL_TABLET | ORAL | Status: DC | PRN
  Administered 2014-06-07 – 2014-06-09 (×7): 10 mg via ORAL
  Filled 2014-06-06 (×7): qty 2

## 2014-06-06 MED ORDER — HYDROCODONE-ACETAMINOPHEN 7.5-325 MG PO TABS
1.0000 | ORAL_TABLET | ORAL | Status: DC | PRN
  Administered 2014-06-08 (×2): 1 via ORAL
  Filled 2014-06-06 (×2): qty 1

## 2014-06-06 MED ORDER — MORPHINE SULFATE 2 MG/ML IJ SOLN
2.0000 mg | INTRAMUSCULAR | Status: DC | PRN
  Administered 2014-06-06: 2 mg via INTRAVENOUS
  Administered 2014-06-07: 4 mg via INTRAVENOUS
  Administered 2014-06-07 – 2014-06-08 (×2): 2 mg via INTRAVENOUS
  Filled 2014-06-06: qty 2
  Filled 2014-06-06 (×3): qty 1

## 2014-06-06 MED ORDER — METOPROLOL TARTRATE 1 MG/ML IV SOLN: 2.0000 mg | INTRAVENOUS | Status: DC | PRN

## 2014-06-06 MED ORDER — SERTRALINE HCL 100 MG PO TABS
100.0000 mg | ORAL_TABLET | Freq: Every day | ORAL | Status: DC
  Administered 2014-06-06 – 2014-06-09 (×4): 100 mg via ORAL
  Filled 2014-06-06 (×4): qty 1

## 2014-06-06 MED ORDER — INSULIN GLARGINE 100 UNIT/ML ~~LOC~~ SOLN
10.0000 [IU] | Freq: Every day | SUBCUTANEOUS | Status: DC
  Administered 2014-06-06 – 2014-06-08 (×3): 10 [IU] via SUBCUTANEOUS
  Filled 2014-06-06 (×4): qty 0.1

## 2014-06-06 MED ORDER — MEMANTINE HCL 10 MG PO TABS
10.0000 mg | ORAL_TABLET | Freq: Two times a day (BID) | ORAL | Status: DC
  Administered 2014-06-06 – 2014-06-09 (×6): 10 mg via ORAL
  Filled 2014-06-06 (×7): qty 1

## 2014-06-06 MED ORDER — ARTIFICIAL TEARS OP OINT: TOPICAL_OINTMENT | OPHTHALMIC | Status: DC | PRN

## 2014-06-06 MED ORDER — SODIUM CHLORIDE 0.9 % IV SOLN
INTRAVENOUS | Status: DC
  Administered 2014-06-06 – 2014-06-07 (×2): via INTRAVENOUS
  Administered 2014-06-07: 125 mL/h via INTRAVENOUS
  Administered 2014-06-07 – 2014-06-08 (×2): via INTRAVENOUS

## 2014-06-06 MED ORDER — BISACODYL 10 MG RE SUPP
10.0000 mg | Freq: Every day | RECTAL | Status: DC | PRN
Start: 2014-06-06 — End: 2014-06-09

## 2014-06-06 MED ORDER — POTASSIUM CHLORIDE CRYS ER 20 MEQ PO TBCR: 20.0000 meq | EXTENDED_RELEASE_TABLET | Freq: Every day | ORAL | Status: DC | PRN

## 2014-06-06 MED ORDER — BUSPIRONE HCL 15 MG PO TABS
15.0000 mg | ORAL_TABLET | Freq: Every day | ORAL | Status: DC
  Administered 2014-06-06 – 2014-06-08 (×3): 15 mg via ORAL
  Filled 2014-06-06 (×4): qty 1

## 2014-06-06 MED ORDER — LACTATED RINGERS IV SOLN
INTRAVENOUS | Status: DC | PRN
  Administered 2014-06-06 (×2): via INTRAVENOUS

## 2014-06-06 MED ORDER — ENOXAPARIN SODIUM 30 MG/0.3ML ~~LOC~~ SOLN
30.0000 mg | SUBCUTANEOUS | Status: DC
  Administered 2014-06-07 – 2014-06-09 (×3): 30 mg via SUBCUTANEOUS
  Filled 2014-06-06 (×5): qty 0.3

## 2014-06-06 MED ORDER — DEXTROSE 5 % IV SOLN
1.5000 g | Freq: Two times a day (BID) | INTRAVENOUS | Status: AC
  Administered 2014-06-06 – 2014-06-07 (×2): 1.5 g via INTRAVENOUS
  Filled 2014-06-06 (×2): qty 1.5

## 2014-06-06 MED ORDER — FENTANYL CITRATE 0.05 MG/ML IJ SOLN
INTRAMUSCULAR | Status: DC | PRN
  Administered 2014-06-06: 100 ug via INTRAVENOUS
  Administered 2014-06-06: 50 ug via INTRAVENOUS
  Administered 2014-06-06: 100 ug via INTRAVENOUS

## 2014-06-06 MED ORDER — PROMETHAZINE HCL 25 MG/ML IJ SOLN
6.2500 mg | INTRAMUSCULAR | Status: DC | PRN
Start: 2014-06-06 — End: 2014-06-06

## 2014-06-06 MED ORDER — HYDRALAZINE HCL 20 MG/ML IJ SOLN
5.0000 mg | INTRAMUSCULAR | Status: AC | PRN
  Administered 2014-06-07 – 2014-06-09 (×2): 5 mg via INTRAVENOUS
  Filled 2014-06-06 (×2): qty 1

## 2014-06-06 MED ORDER — BACITRACIN ZINC 500 UNIT/GM EX OINT
TOPICAL_OINTMENT | CUTANEOUS | Status: AC
  Filled 2014-06-06: qty 28.35

## 2014-06-06 MED ORDER — CARVEDILOL 12.5 MG PO TABS
12.5000 mg | ORAL_TABLET | Freq: Two times a day (BID) | ORAL | Status: DC
  Administered 2014-06-07 – 2014-06-09 (×5): 12.5 mg via ORAL
  Filled 2014-06-06 (×7): qty 1

## 2014-06-06 MED ORDER — ROCURONIUM BROMIDE 100 MG/10ML IV SOLN
INTRAVENOUS | Status: DC | PRN
  Administered 2014-06-06: 30 mg via INTRAVENOUS

## 2014-06-06 MED ORDER — MIDAZOLAM HCL 5 MG/5ML IJ SOLN
INTRAMUSCULAR | Status: DC | PRN
  Administered 2014-06-06 (×2): 1 mg via INTRAVENOUS

## 2014-06-06 MED ORDER — MIDAZOLAM HCL 2 MG/2ML IJ SOLN
INTRAMUSCULAR | Status: AC
  Filled 2014-06-06: qty 2

## 2014-06-06 MED ORDER — ONDANSETRON HCL 4 MG/2ML IJ SOLN: 4.0000 mg | Freq: Four times a day (QID) | INTRAMUSCULAR | Status: DC | PRN

## 2014-06-06 MED ORDER — SENNOSIDES-DOCUSATE SODIUM 8.6-50 MG PO TABS
1.0000 | ORAL_TABLET | Freq: Every evening | ORAL | Status: DC | PRN
  Filled 2014-06-06: qty 1

## 2014-06-06 MED ORDER — 0.9 % SODIUM CHLORIDE (POUR BTL) OPTIME
TOPICAL | Status: DC | PRN
  Administered 2014-06-06: 1000 mL

## 2014-06-06 MED ORDER — FENTANYL CITRATE 0.05 MG/ML IJ SOLN
INTRAMUSCULAR | Status: AC
  Administered 2014-06-06: 50 ug via INTRAVENOUS
  Filled 2014-06-06: qty 2

## 2014-06-06 MED ORDER — INSULIN ASPART 100 UNIT/ML ~~LOC~~ SOLN: 6.0000 [IU] | Freq: Once | SUBCUTANEOUS | Status: DC

## 2014-06-06 MED ORDER — INSULIN ASPART 100 UNIT/ML ~~LOC~~ SOLN
0.0000 [IU] | Freq: Three times a day (TID) | SUBCUTANEOUS | Status: DC
  Administered 2014-06-07: 7 [IU] via SUBCUTANEOUS
  Administered 2014-06-07: 5 [IU] via SUBCUTANEOUS
  Administered 2014-06-07 – 2014-06-08 (×2): 3 [IU] via SUBCUTANEOUS
  Administered 2014-06-08: 5 [IU] via SUBCUTANEOUS
  Administered 2014-06-08: 7 [IU] via SUBCUTANEOUS
  Administered 2014-06-09: 2 [IU] via SUBCUTANEOUS
  Administered 2014-06-09: 1 [IU] via SUBCUTANEOUS

## 2014-06-06 MED ORDER — PHENOL 1.4 % MT LIQD
1.0000 | OROMUCOSAL | Status: DC | PRN
Start: 2014-06-06 — End: 2014-06-09

## 2014-06-06 MED ORDER — LIDOCAINE HCL (CARDIAC) 20 MG/ML IV SOLN
INTRAVENOUS | Status: DC | PRN
  Administered 2014-06-06: 100 mg via INTRAVENOUS

## 2014-06-06 MED ORDER — GLYCOPYRROLATE 0.2 MG/ML IJ SOLN
INTRAMUSCULAR | Status: DC | PRN
  Administered 2014-06-06: 0.3 mg via INTRAVENOUS

## 2014-06-06 MED ORDER — AMLODIPINE BESYLATE 10 MG PO TABS
10.0000 mg | ORAL_TABLET | Freq: Every day | ORAL | Status: DC
  Administered 2014-06-07 – 2014-06-08 (×2): 10 mg via ORAL
  Filled 2014-06-06 (×3): qty 1

## 2014-06-06 MED ORDER — LABETALOL HCL 5 MG/ML IV SOLN
10.0000 mg | INTRAVENOUS | Status: DC | PRN
  Filled 2014-06-06: qty 4

## 2014-06-06 MED ORDER — DIVALPROEX SODIUM ER 250 MG PO TB24
250.0000 mg | ORAL_TABLET | Freq: Two times a day (BID) | ORAL | Status: DC
  Administered 2014-06-06 – 2014-06-09 (×6): 250 mg via ORAL
  Filled 2014-06-06 (×7): qty 1

## 2014-06-06 MED ORDER — MEPERIDINE HCL 25 MG/ML IJ SOLN: 6.2500 mg | INTRAMUSCULAR | Status: DC | PRN

## 2014-06-06 MED ORDER — INSULIN ASPART 100 UNIT/ML ~~LOC~~ SOLN
SUBCUTANEOUS | Status: AC
  Administered 2014-06-06: 6 [IU] via SUBCUTANEOUS
  Filled 2014-06-06: qty 1

## 2014-06-06 MED ORDER — DEXTROSE 5 % IV SOLN
1.5000 g | INTRAVENOUS | Status: AC
  Administered 2014-06-06: 1.5 g via INTRAVENOUS
  Filled 2014-06-06: qty 1.5

## 2014-06-06 MED ORDER — ENSURE ENLIVE PO LIQD
237.0000 mL | Freq: Two times a day (BID) | ORAL | Status: DC
  Administered 2014-06-07: 237 mL via ORAL

## 2014-06-06 MED ORDER — ASPIRIN 81 MG PO CHEW
81.0000 mg | CHEWABLE_TABLET | Freq: Every day | ORAL | Status: DC
  Administered 2014-06-07 – 2014-06-09 (×3): 81 mg via ORAL
  Filled 2014-06-06 (×3): qty 1

## 2014-06-06 MED ORDER — MAGNESIUM SULFATE 2 GM/50ML IV SOLN
2.0000 g | Freq: Every day | INTRAVENOUS | Status: DC | PRN
  Filled 2014-06-06: qty 50

## 2014-06-06 MED ORDER — RISPERIDONE 0.25 MG PO TABS
0.2500 mg | ORAL_TABLET | Freq: Every day | ORAL | Status: DC
  Administered 2014-06-06 – 2014-06-08 (×3): 0.25 mg via ORAL
  Filled 2014-06-06 (×4): qty 1

## 2014-06-06 MED ORDER — TAMSULOSIN HCL 0.4 MG PO CAPS
0.4000 mg | ORAL_CAPSULE | Freq: Every day | ORAL | Status: DC
  Administered 2014-06-06 – 2014-06-08 (×3): 0.4 mg via ORAL
  Filled 2014-06-06 (×4): qty 1

## 2014-06-06 MED ORDER — PANTOPRAZOLE SODIUM 40 MG PO TBEC
40.0000 mg | DELAYED_RELEASE_TABLET | Freq: Every day | ORAL | Status: DC
  Administered 2014-06-06 – 2014-06-09 (×4): 40 mg via ORAL
  Filled 2014-06-06 (×3): qty 1

## 2014-06-06 MED ORDER — FENTANYL CITRATE 0.05 MG/ML IJ SOLN
25.0000 ug | INTRAMUSCULAR | Status: DC | PRN
  Administered 2014-06-06: 50 ug via INTRAVENOUS

## 2014-06-06 MED ORDER — FENTANYL CITRATE 0.05 MG/ML IJ SOLN
INTRAMUSCULAR | Status: AC
  Filled 2014-06-06: qty 5

## 2014-06-06 MED ORDER — NEOSTIGMINE METHYLSULFATE 10 MG/10ML IV SOLN
INTRAVENOUS | Status: DC | PRN
  Administered 2014-06-06: 2.5 mg via INTRAVENOUS

## 2014-06-06 MED ORDER — OXYBUTYNIN CHLORIDE ER 10 MG PO TB24
10.0000 mg | ORAL_TABLET | Freq: Every day | ORAL | Status: DC
  Administered 2014-06-06 – 2014-06-08 (×3): 10 mg via ORAL
  Filled 2014-06-06 (×4): qty 1

## 2014-06-06 MED ORDER — LACTATED RINGERS IV SOLN
INTRAVENOUS | Status: DC
  Administered 2014-06-06: 12:00:00 via INTRAVENOUS

## 2014-06-06 MED ORDER — INSULIN ASPART 100 UNIT/ML ~~LOC~~ SOLN
SUBCUTANEOUS | Status: AC
  Administered 2014-06-06: 6 [IU] via INTRAVENOUS
  Filled 2014-06-06: qty 1

## 2014-06-06 MED ORDER — INSULIN ASPART 100 UNIT/ML ~~LOC~~ SOLN
6.0000 [IU] | Freq: Once | SUBCUTANEOUS | Status: AC
  Administered 2014-06-06: 6 [IU] via INTRAVENOUS

## 2014-06-06 MED ORDER — DOCUSATE SODIUM 100 MG PO CAPS
100.0000 mg | ORAL_CAPSULE | Freq: Every day | ORAL | Status: DC
  Administered 2014-06-07 – 2014-06-09 (×3): 100 mg via ORAL
  Filled 2014-06-06 (×5): qty 1

## 2014-06-06 MED ORDER — LACTATED RINGERS IV SOLN: INTRAVENOUS | Status: DC

## 2014-06-06 MED ORDER — ALUM & MAG HYDROXIDE-SIMETH 200-200-20 MG/5ML PO SUSP: 15.0000 mL | ORAL | Status: DC | PRN

## 2014-06-06 MED ORDER — METFORMIN HCL 500 MG PO TABS
1000.0000 mg | ORAL_TABLET | Freq: Every day | ORAL | Status: DC
  Administered 2014-06-06 – 2014-06-08 (×3): 1000 mg via ORAL
  Filled 2014-06-06 (×4): qty 2

## 2014-06-06 MED ORDER — CLOPIDOGREL BISULFATE 75 MG PO TABS
75.0000 mg | ORAL_TABLET | Freq: Every day | ORAL | Status: DC
  Administered 2014-06-07 – 2014-06-09 (×3): 75 mg via ORAL
  Filled 2014-06-06 (×3): qty 1

## 2014-06-06 MED ORDER — PROPOFOL 10 MG/ML IV BOLUS
INTRAVENOUS | Status: DC | PRN
  Administered 2014-06-06: 110 mg via INTRAVENOUS

## 2014-06-06 SURGICAL SUPPLY — 46 items
BANDAGE ELASTIC 4 VELCRO ST LF (GAUZE/BANDAGES/DRESSINGS) ×3 IMPLANT
BANDAGE ELASTIC 6 VELCRO ST LF (GAUZE/BANDAGES/DRESSINGS) IMPLANT
BANDAGE ESMARK 6X9 LF (GAUZE/BANDAGES/DRESSINGS) ×1 IMPLANT
BLADE SAW RECIP 87.9 MT (BLADE) ×3 IMPLANT
BNDG COHESIVE 6X5 TAN STRL LF (GAUZE/BANDAGES/DRESSINGS) ×3 IMPLANT
BNDG ESMARK 6X9 LF (GAUZE/BANDAGES/DRESSINGS) ×3
BNDG GAUZE ELAST 4 BULKY (GAUZE/BANDAGES/DRESSINGS) ×3 IMPLANT
CANISTER SUCTION 2500CC (MISCELLANEOUS) ×3 IMPLANT
CLIP TI MEDIUM 6 (CLIP) ×3 IMPLANT
CUFF TOURNIQUET SINGLE 24IN (TOURNIQUET CUFF) ×3 IMPLANT
CUFF TOURNIQUET SINGLE 34IN LL (TOURNIQUET CUFF) IMPLANT
CUFF TOURNIQUET SINGLE 44IN (TOURNIQUET CUFF) IMPLANT
DRAIN CHANNEL 19F RND (DRAIN) IMPLANT
DRAPE ORTHO SPLIT 77X108 STRL (DRAPES) ×4
DRAPE PROXIMA HALF (DRAPES) ×3 IMPLANT
DRAPE SURG ORHT 6 SPLT 77X108 (DRAPES) ×2 IMPLANT
DRAPE U-SHAPE 47X51 STRL (DRAPES) ×3 IMPLANT
DRSG ADAPTIC 3X8 NADH LF (GAUZE/BANDAGES/DRESSINGS) ×3 IMPLANT
ELECT REM PT RETURN 9FT ADLT (ELECTROSURGICAL) ×3
ELECTRODE REM PT RTRN 9FT ADLT (ELECTROSURGICAL) ×1 IMPLANT
EVACUATOR SILICONE 100CC (DRAIN) IMPLANT
GAUZE SPONGE 4X4 12PLY STRL (GAUZE/BANDAGES/DRESSINGS) ×6 IMPLANT
GLOVE BIO SURGEON STRL SZ7.5 (GLOVE) ×3 IMPLANT
GLOVE BIOGEL PI IND STRL 8 (GLOVE) ×1 IMPLANT
GLOVE BIOGEL PI INDICATOR 8 (GLOVE) ×2
GOWN STRL REUS W/ TWL LRG LVL3 (GOWN DISPOSABLE) ×3 IMPLANT
GOWN STRL REUS W/TWL LRG LVL3 (GOWN DISPOSABLE) ×6
KIT BASIN OR (CUSTOM PROCEDURE TRAY) ×3 IMPLANT
KIT ROOM TURNOVER OR (KITS) ×3 IMPLANT
NS IRRIG 1000ML POUR BTL (IV SOLUTION) ×3 IMPLANT
PACK GENERAL/GYN (CUSTOM PROCEDURE TRAY) ×3 IMPLANT
PAD ARMBOARD 7.5X6 YLW CONV (MISCELLANEOUS) ×6 IMPLANT
PADDING CAST COTTON 6X4 STRL (CAST SUPPLIES) ×3 IMPLANT
SPONGE GAUZE 4X4 12PLY STER LF (GAUZE/BANDAGES/DRESSINGS) ×6 IMPLANT
STAPLER VISISTAT (STAPLE) ×3 IMPLANT
STOCKINETTE IMPERVIOUS LG (DRAPES) ×3 IMPLANT
SUT ETHILON 3 0 PS 1 (SUTURE) IMPLANT
SUT SILK 0 TIES 10X30 (SUTURE) ×3 IMPLANT
SUT SILK 2 0 (SUTURE) ×2
SUT SILK 2 0 SH CR/8 (SUTURE) ×3 IMPLANT
SUT SILK 2-0 18XBRD TIE 12 (SUTURE) ×1 IMPLANT
SUT SILK 3 0 (SUTURE) ×2
SUT SILK 3-0 18XBRD TIE 12 (SUTURE) ×1 IMPLANT
SUT VIC AB 2-0 CT1 18 (SUTURE) ×6 IMPLANT
UNDERPAD 30X30 INCONTINENT (UNDERPADS AND DIAPERS) ×3 IMPLANT
WATER STERILE IRR 1000ML POUR (IV SOLUTION) ×3 IMPLANT

## 2014-06-06 NOTE — Anesthesia Procedure Notes (Signed)
Procedure Name: Intubation Date/Time: 06/06/2014 12:42 PM Performed by: Wray Kearns A Pre-anesthesia Checklist: Patient identified, Timeout performed, Emergency Drugs available, Suction available and Patient being monitored Patient Re-evaluated:Patient Re-evaluated prior to inductionOxygen Delivery Method: Circle system utilized Preoxygenation: Pre-oxygenation with 100% oxygen Intubation Type: IV induction and Cricoid Pressure applied Ventilation: Mask ventilation without difficulty Laryngoscope Size: Mac and 4 Grade View: Grade I Tube type: Oral Tube size: 7.5 mm Number of attempts: 1 Airway Equipment and Method: Stylet Placement Confirmation: ETT inserted through vocal cords under direct vision,  breath sounds checked- equal and bilateral and positive ETCO2 Secured at: 23 cm Tube secured with: Tape Dental Injury: Teeth and Oropharynx as per pre-operative assessment

## 2014-06-06 NOTE — OR Nursing (Signed)
Attempted to call report to 2W.  RN Environmental consultant occupied.  Left message to call back.

## 2014-06-06 NOTE — Interval H&P Note (Signed)
History and Physical Interval Note:  06/06/2014 12:09 PM  Terry Green  has presented today for surgery, with the diagnosis of Gangrene right foot I96  The various methods of treatment have been discussed with the patient and family. After consideration of risks, benefits and other options for treatment, the patient has consented to  Procedure(s): AMPUTATION BELOW KNEE (Right) as a surgical intervention .  The patient's history has been reviewed, patient examined, no change in status, stable for surgery.  I have reviewed the patient's chart and labs.  Questions were answered to the patient's satisfaction.     DICKSON,CHRISTOPHER S

## 2014-06-06 NOTE — H&P (View-Only) (Signed)
[facility] [place]  Patient name: Terry Green MRN: [id number] DOB: 09/06/1940 Sex: male  REASON FOR CONSULT: Gangrene of the right foot  HPI: Terry Green is a 74 y.o. male who apparently developed wounds on the toes of his right foot over a month ago. He was seen at [facility]. He underwent a CT angiogram the results of which are described below. According to his daughter, he then underwent attempted balloon angioplasty and stenting of the anterior tibial artery that apparently was unsuccessful. Consideration was being given to bypass however he was felt to be at high-risk and therefore sent for a second opinion.  He resides in a skilled nursing facility and his activity is very limited. I do not get any clear-cut history of claudication or rest pain. He denies fever or chills.  Past Medical History  Diagnosis Date  . Hypertension   . Hypercholesteremia   . Diabetes mellitus without complication   . Arthritis     rheumatoid  . Peripheral vascular disease    Family History  Problem Relation Age of Onset  . Heart disease Father    SOCIAL HISTORY: History  Substance Use Topics  . Smoking status: Former Smoker -- 0.25 packs/day for 25 years    Types: Cigarettes    Quit date: 05/25/2012  . Smokeless tobacco: Never Used  . Alcohol Use: No   Allergies  Allergen Reactions  . Lisinopril Swelling   Current Outpatient Prescriptions  Medication Sig Dispense Refill  . amLODipine (NORVASC) 10 MG tablet Take 10 mg by mouth daily.    Marland Kitchen aspirin 81 MG chewable tablet Chew 81 mg by mouth daily.    . carvedilol (COREG) 12.5 MG tablet Take 12.5 mg by mouth 2 (two) times daily with a meal.    . memantine (NAMENDA) 10 MG tablet Take 10 mg by mouth 2 (two) times daily.    . metFORMIN (GLUCOPHAGE) 1000 MG tablet Take 1,000 mg by mouth daily.    . Multiple Vitamins-Minerals (MULTIVITAMINS THER. W/MINERALS) TABS Take 1 tablet by mouth daily.      Marland Kitchen oxycodone (OXY-IR) 5 MG capsule Take 5 mg by mouth 2 (two) times daily as needed.     No current facility-administered medications for this visit.   REVIEW OF SYSTEMS: Arly.Keller ] denotes positive finding; [  ] denotes negative finding  CARDIOVASCULAR:  [ ]  chest pain   [ ]  chest pressure   [ ]  palpitations   [ ]  orthopnea   ] dyspnea on exertion   [ ]  claudication   [ ]  rest pain   [ ]  DVT   [ ]  phlebitis PULMONARY:   [ ]  productive cough   [ ]  asthma   [ ]  wheezing NEUROLOGIC:   ] weakness Generalized [ ]  paresthesias  [ ]  aphasia  [ ]  amaurosis  [ ]  dizziness HEMATOLOGIC:   [ ]  bleeding problems   [ ]  clotting disorders MUSCULOSKELETAL:  ] joint pain   [ ]  joint swelling [ ]  leg swelling GASTROINTESTINAL: [ ]   blood in stool  [ ]   hematemesis GENITOURINARY:  [ ]   dysuria  [ ]   hematuria PSYCHIATRIC:  [ ]  history of major depression INTEGUMENTARY:  [ ]  rashes  ] ulcers CONSTITUTIONAL:  [ ]  fever   [ ]  chills  PHYSICAL EXAM: Filed Vitals:   05/26/14 1551  BP: 146/56  Pulse: 80  Resp: 18  Height: 6\' 1"  (1.854  m)  Weight: 180 lb (81.647 kg)   Body mass index is 23.75 kg/(m^2). GENERAL: The patient is a well-nourished male, in no acute distress. The vital signs are documented above. CARDIOVASCULAR: There is a regular rate and rhythm. I do not detect carotid bruits. He has palpable femoral pulses. I cannot palpate a popliteal or pedal pulse on the right. On the left side he has a palpable popliteal pulse. I cannot palpate pedal pulses on the left. PULMONARY: There is good air exchange bilaterally without wheezing or rales. ABDOMEN: Soft and non-tender with normal pitched bowel sounds.  MUSCULOSKELETAL: There are no major deformities. NEUROLOGIC: No focal weakness or paresthesias are detected. SKIN: He has extensive gangrene involving the right first third fourth and fifth toes. In addition there are gangrenous changes on the second toe. He also has a wound over the lateral  malleolus which is draining and appears to track to the bone. PSYCHIATRIC: The patient has a normal affect.  DATA:  CTA done at [facility] on 04/25/2014 shows that the infrarenal aorta is widely patent. The proximal superficial femoral artery is patent on the right. There is a short segment moderate stenosis of the distal right superficial femoral artery. The right popliteal artery is occluded with reconstitution of the anterior tibial artery. There is severe disease of the right posterior tibial and peroneal artery.  I have reviewed the records that were sent with him from [facility]. He was treated at the wound care center they're but the wounds have progressed.  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 The CT angiogram shows that he might potentially be a candidate for a femoral to anterior tibial artery bypass although since that study was done he is apparently had attempted angioplasty and stenting of the anterior tibial artery which was not successful. I have explained that if we were going to consider attempted limb salvage, I would need to obtain an arteriogram to see if he still had a distal target on the right. This would be associated with significant risk. In addition, given the wound over the lateral malleolus I do not think that the foot will be salvageable and for this reason I think the safest course would be a primary below the knee amputation. If he were going to consider revascularization we would need preoperative cardiac clearance also. All things considered, I think the safest approach is below the knee amputation and the patient and his daughter are agreeable. They would like to have this done in [place]. The soonest I can schedule it is 06/06/2014. I have offered to let one  of my partners do this sooner however they would prefer to have me do it on the 22nd. In the meantime I've given instructions to the nursing facility to soak the foot daily in lukewarm dye also soaks and keep a dry dressing on the wound.   Rankin Coolman S [facility] [place]: [phone]    

## 2014-06-06 NOTE — Anesthesia Postprocedure Evaluation (Signed)
  Anesthesia Post-op Note  Patient: Terry Green  Procedure(s) Performed: Procedure(s): AMPUTATION BELOW KNEE (Right)  Patient Location: PACU  Anesthesia Type:General  Level of Consciousness: awake  Airway and Oxygen Therapy: Patient Spontanous Breathing  Post-op Pain: mild  Post-op Assessment: Post-op Vital signs reviewed  Post-op Vital Signs: Reviewed  Last Vitals:  Filed Vitals:   06/06/14 1500  BP: 135/60  Pulse: 63  Temp:   Resp: 20    Complications: No apparent anesthesia complications

## 2014-06-06 NOTE — Op Note (Signed)
    NAME: Terry Green    MRN: [id number] DOB: Nov 29, 1940    DATE OF OPERATION: 06/06/2014  PREOP DIAGNOSIS: gangrene of the right foot  POSTOP DIAGNOSIS: same  PROCEDURE: Right below the knee amputation  SURGEON: Di Kindle. Edilia Bo, MD, FACS  ASSIST: Lianne Cure PA  ANESTHESIA: Gen.   EBL: minimal  INDICATIONS: [name] is a 74 y.o. male who presented with gangrene of the right foot. There were no good options for revascularization and he had undergone attempted revascularization elsewhere which was unsuccessful.  FINDINGS: the tissue appeared well-perfused.  TECHNIQUE: The patient was taken to the operating room and received general anesthetic. The right lower extremity was prepped and draped in usual sterile fashion. The circumference of the limb was measured distal to the tibial tuberosity and two thirds of this distance was used to mark an anterior skin incision. A long posterior incision of equal length was marked. The leg was exsanguinated with an Esmarch bandage and the tourniquet inflated to 300 mmHg. This had been placed on the thigh. Incision was carried down to the skin and subcutaneous tissue and muscle to the tibia and fibula which were dissected free circumferentially. The tibia was divided proximal to the level of skin division and the anterior aspect of the bone was beveled. The fibula was divided proximal to this level. The leg was removed. The arteries and veins were individually suture ligated with 2-0 silk ties. The tourniquet was then released. Additional hemostasis was obtained using electrocautery and 2-0 silk ties. The edges of the bone were rasped. The fascial layer was closed with interrupted 2-0 Vicryl's. The skin was closed with staples. Sterile dressing was applied. The patient tolerated the procedure well and was transferred to the recovery room in stable condition. All needle and sponge counts were correct.  [name], MD,  FACS [facility]  DATE OF DICTATION:   06/06/2014

## 2014-06-06 NOTE — Progress Notes (Signed)
Pt stump dsg remains clean, dry and intact; reported off to incoming RN; pt sleeping comfortably in bed. Arabella Merles Levan Aloia RN.

## 2014-06-06 NOTE — Progress Notes (Signed)
Report received from PACU RN at 1700 and pt arrived to the unit via bed with IV intact and infusing at 1730. VSS; telemetry applied and verified; pt denies any pain; pt oriented to the unit and room; call light within reach; right BKA has clean, dry and intact compression dsg; no active bleeding; stain or drainage noted. Pt BKA elevated on pillow; pt educated and advised on keeping stump elevated flat on pillow and the importance of not keeping stump bended in bed to prevent contraction and bleeding. Pt voices understanding; pt states "I just want to rest", pt made comfortable and sleeping quietly in bed with call light within reach. Will continue to monitor quietly. Dionne Bucy RN

## 2014-06-06 NOTE — Progress Notes (Signed)
Utilization review completed.  

## 2014-06-06 NOTE — OR Nursing (Signed)
Dr. Berneice Heinrich notified of post op CBG 233. 6 units Novolog ordered.

## 2014-06-06 NOTE — Anesthesia Preprocedure Evaluation (Addendum)
Anesthesia Evaluation  Patient identified by MRN, date of birth, ID band Patient awake    Reviewed: Allergy & Precautions, NPO status , Patient's Chart, lab work & pertinent test results  Airway Mallampati: II   Neck ROM: Full    Dental  (+) Edentulous Upper, Edentulous Lower   Pulmonary former smoker (quit 2014),  breath sounds clear to auscultation        Cardiovascular hypertension, Pt. on medications + Peripheral Vascular Disease Rhythm:Regular  EKG 2014 LVH, had ECHO and STRESS 2008 results not available   Neuro/Psych    GI/Hepatic negative GI ROS, Neg liver ROS,   Endo/Other  diabetes, Type 2, Insulin Dependent  Renal/GU      Musculoskeletal   Abdominal (+)  Abdomen: soft.    Peds  Hematology   Anesthesia Other Findings   Reproductive/Obstetrics                           Anesthesia Physical Anesthesia Plan  ASA: III  Anesthesia Plan: General   Post-op Pain Management:    Induction: Intravenous  Airway Management Planned: Oral ETT  Additional Equipment:   Intra-op Plan:   Post-operative Plan: Extubation in OR  Informed Consent: I have reviewed the patients History and Physical, chart, labs and discussed the procedure including the risks, benefits and alternatives for the proposed anesthesia with the patient or authorized representative who has indicated his/her understanding and acceptance.     Plan Discussed with:   Anesthesia Plan Comments: (Need to check this am labs, and EKG, question about results of 2008 ECHO and STRESS, verify stopping plavix)        Anesthesia Quick Evaluation

## 2014-06-06 NOTE — Transfer of Care (Signed)
Immediate Anesthesia Transfer of Care Note  Patient: Terry Green  Procedure(s) Performed: Procedure(s): AMPUTATION BELOW KNEE (Right)  Patient Location: PACU  Anesthesia Type:General  Level of Consciousness: awake, oriented, sedated, patient cooperative and responds to stimulation  Airway & Oxygen Therapy: Patient Spontanous Breathing and Patient connected to nasal cannula oxygen  Post-op Assessment: Report given to RN, Post -op Vital signs reviewed and stable, Patient moving all extremities and Patient moving all extremities X 4  Post vital signs: Reviewed and stable  Last Vitals:  Filed Vitals:   06/06/14 1010  BP: 181/73  Pulse:   Temp:   Resp:     Complications: No apparent anesthesia complications

## 2014-06-07 ENCOUNTER — Encounter (HOSPITAL_COMMUNITY): Payer: Self-pay | Admitting: Vascular Surgery

## 2014-06-07 LAB — GLUCOSE, CAPILLARY
GLUCOSE-CAPILLARY: 225 mg/dL — AB (ref 70–99)
Glucose-Capillary: 247 mg/dL — ABNORMAL HIGH (ref 70–99)
Glucose-Capillary: 295 mg/dL — ABNORMAL HIGH (ref 70–99)
Glucose-Capillary: 308 mg/dL — ABNORMAL HIGH (ref 70–99)
Glucose-Capillary: 353 mg/dL — ABNORMAL HIGH (ref 70–99)

## 2014-06-07 LAB — CBC
HCT: 24.7 % — ABNORMAL LOW (ref 39.0–52.0)
HEMOGLOBIN: 7.6 g/dL — AB (ref 13.0–17.0)
MCH: 20.1 pg — ABNORMAL LOW (ref 26.0–34.0)
MCHC: 30.8 g/dL (ref 30.0–36.0)
MCV: 65.2 fL — ABNORMAL LOW (ref 78.0–100.0)
Platelets: 327 10*3/uL (ref 150–400)
RBC: 3.79 MIL/uL — ABNORMAL LOW (ref 4.22–5.81)
RDW: 18 % — ABNORMAL HIGH (ref 11.5–15.5)
WBC: 11.3 10*3/uL — ABNORMAL HIGH (ref 4.0–10.5)

## 2014-06-07 LAB — BASIC METABOLIC PANEL
Anion gap: 6 (ref 5–15)
BUN: 6 mg/dL (ref 6–23)
CALCIUM: 8.4 mg/dL (ref 8.4–10.5)
CO2: 27 mmol/L (ref 19–32)
Chloride: 100 mmol/L (ref 96–112)
Creatinine, Ser: 0.68 mg/dL (ref 0.50–1.35)
GLUCOSE: 251 mg/dL — AB (ref 70–99)
Potassium: 3.9 mmol/L (ref 3.5–5.1)
SODIUM: 133 mmol/L — AB (ref 135–145)

## 2014-06-07 MED ORDER — GLUCERNA SHAKE PO LIQD
237.0000 mL | Freq: Three times a day (TID) | ORAL | Status: DC
  Administered 2014-06-07 – 2014-06-09 (×5): 237 mL via ORAL

## 2014-06-07 NOTE — Evaluation (Signed)
Physical Therapy Evaluation Patient Details Name: Terry Green MRN: [id number] DOB: December 12, 1940 Today's Date: 06/07/2014   History of Present Illness  Pt presents for right BKA due to gangrene of right foot. PMH: DM, PAD, PVD, HTN, bipolar, RA  Clinical Impression  Pt admitted with above diagnosis. Pt currently with functional limitations due to the deficits listed below (see PT Problem List). Pt fearful of standing but was able to do so 3x with increasing effort. Pt with right knee flexion and unable to fully extend even with assistance. Recommend KI to right knee. Positioned to promote full ext. Pt will benefit from skilled PT to increase their independence and safety with mobility to allow discharge to the venue listed below.       Follow Up Recommendations SNF    Equipment Recommendations  None recommended by PT    Recommendations for Other Services       Precautions / Restrictions Precautions Precautions: Fall Restrictions Weight Bearing Restrictions: No      Mobility  Bed Mobility Overal bed mobility: Needs Assistance Bed Mobility: Supine to Sit;Sit to Supine     Supine to sit: Min assist Sit to supine: Min assist   General bed mobility comments: vc's for sequencing and rolling first, pt with heavy use of rail and min A to elevate trunkl  Transfers Overall transfer level: Needs assistance Equipment used: Rolling walker (2 wheeled) Transfers: Sit to/from Stand Sit to Stand: Mod assist;+2 physical assistance         General transfer comment: mod A +2 for power up with vc's for pt to push down into floor with LLE. Pt improved with trials, performed 3x. Pt very fearful at the beginning. Pt had already been in chair this morning and did not want to go back yet so no SPT but educated pt on having people help him out of bed to left to ease transfer, pt verbalized understanding  Ambulation/Gait             General Gait Details: unable to hop on LLE  today  Stairs            Wheelchair Mobility    Modified Rankin (Stroke Patients Only)       Balance Overall balance assessment: Needs assistance Sitting-balance support: Bilateral upper extremity supported Sitting balance-Leahy Scale: Fair   Postural control: Left lateral lean Standing balance support: Bilateral upper extremity supported;During functional activity Standing balance-Leahy Scale: Poor                               Pertinent Vitals/Pain Pain Assessment: Faces Faces Pain Scale: Hurts even more Pain Location: right knee Pain Intervention(s): Monitored during session;Premedicated before session    Home Living Family/patient expects to be discharged to:: Skilled nursing facility                 Additional Comments: pt from [facility]    Prior Function Level of Independence: Needs assistance   Gait / Transfers Assistance Needed: pt was unable to use RLE due to gangrene right foot, was performing sliding board transfers with min A to w/c  ADL's / Homemaking Assistance Needed: assist for bathing and dressing        Hand Dominance        Extremity/Trunk Assessment   Upper Extremity Assessment: Defer to OT evaluation           Lower Extremity Assessment: RLE deficits/detail RLE Deficits /  Details: pt unable to fully strighten right knee today due to cramping in right hamstring. Worked on stretching right hamstring and right quad contraction but pt still lacking 15-20 degrees extension    Cervical / Trunk Assessment: Kyphotic  Communication   Communication: No difficulties  Cognition Arousal/Alertness: Awake/alert Behavior During Therapy: WFL for tasks assessed/performed;Anxious Overall Cognitive Status: Within Functional Limits for tasks assessed                      General Comments General comments (skin integrity, edema, etc.): education on proper positioning to promote right knee extension. Education also  given on RLE preparation for eventual prosthesis    Exercises Amputee Exercises Quad Sets: AROM;Right;10 reps;Supine Hip Extension: AROM;Right;10 reps;Standing Hip Flexion/Marching: AROM;Right;10 reps;Supine Knee Flexion: AROM;Right;5 reps;Seated Knee Extension: AROM;Right;10 reps;Seated      Assessment/Plan    PT Assessment Patient needs continued PT services  PT Diagnosis Difficulty walking;Acute pain   PT Problem List Decreased strength;Decreased range of motion;Decreased activity tolerance;Decreased balance;Decreased mobility;Decreased knowledge of use of DME;Decreased knowledge of precautions;Pain  PT Treatment Interventions DME instruction;Gait training;Functional mobility training;Therapeutic activities;Therapeutic exercise;Balance training;Patient/family education;Neuromuscular re-education   PT Goals (Current goals can be found in the Care Plan section) Acute Rehab PT Goals Patient Stated Goal: return to [facility], get new leg PT Goal Formulation: With patient Time For Goal Achievement: 06/21/14 Potential to Achieve Goals: Good    Frequency Min 3X/week   Barriers to discharge        Green-evaluation               End of Session Equipment Utilized During Treatment: Gait belt Activity Tolerance: Patient tolerated treatment well Patient left: in bed;with call bell/phone within reach;with bed alarm set Nurse Communication: Mobility status         Time: 5427-0623 PT Time Calculation (min) (ACUTE ONLY): 30 min   Charges:   PT Evaluation $Initial PT Evaluation Tier I: 1 Procedure PT Treatments $Therapeutic Activity: 8-22 mins   PT G Codes:      Terry Green, PT  Acute Rehab Services  [phone]    [place], Terry Green 06/07/2014, 1:25 PM

## 2014-06-07 NOTE — Progress Notes (Signed)
Physical medicine rehabilitation consult requested chart reviewed. Patient currently resides at [facility] skilled nursing facility and plans to return to facility after recent right below-knee amputation. Hold on formal rehabilitation consult this time with plan to return to skilled facility and discussed with case manager

## 2014-06-07 NOTE — Progress Notes (Signed)
INITIAL NUTRITION ASSESSMENT  DOCUMENTATION CODES Per approved criteria  -Severe malnutrition in the context of acute illness or injury   Pt meets criteria for severe MALNUTRITION in the context of acute illness as evidenced by mild to moderate fat and muscle depletion, 14% wt loss x 1 month.  INTERVENTION: -D/c Ensure Enlive po BID, each supplement provides 350 kcal and 20 grams of protein -Glucerna Shake po TID, each supplement provides 220 kcal and 10 grams of protein  NUTRITION DIAGNOSIS: Increased nutrient needs related to wound healing as evidenced by estimated needs.   Goal: Pt will meet >90% of estimated nutritional needs  Monitor:  PO/supplement intake, labs, weight changes, I/O's  Reason for Assessment: MST=2  74 y.o. male  Admitting Dx: <principal problem not specified>  Terry Green is a 74 y.o. male who apparently developed wounds on the toes of his right foot over a month ago. He was seen at [facility]. He underwent a CT angiogram. According to his daughter, he then underwent attempted balloon angioplasty and stenting of the anterior tibial artery that apparently was unsuccessful.   ASSESSMENT: Pt s/p rt BKA on 06/06/14. Pt reports poor appetite currently, but improving since surgery. He reports he consumed his eggs and bacon this morning. This is consistent with documented meal intake (50%).  He reports good appetite at baseline. He reveals tjat he has lost 30# (14%) x 1 month, however, he reveals that some of this weight loss was intentional.  Pt consumed Ensure Enlive at time of visit and reports he enjoys the flavor. However, RN reported concern about blood sugars consistently in 200's. Will substitute with Glucerna for optimized glycemic control. Discussed importance of good meal and supplement intake to promote healing.  Labs reviewed. Na: 133, Glucose: 251. CBGS: 213-247.   Nutrition Focused Physical Exam:  Subcutaneous Fat:  Orbital  Region: mild to moderate depletion Upper Arm Region: mild to moderate depletion Thoracic and Lumbar Region: WDL  Muscle:  Temple Region: severe depletion Clavicle Bone Region: mild to moderate depletion Clavicle and Acromion Bone Region: WDL Scapular Bone Region: WDL Dorsal Hand: WDL Patellar Region: moderate depletion Anterior Thigh Region: mild depletion Posterior Calf Region: moderate depletion  Edema: none present    Height: Ht Readings from Last 1 Encounters:  05/26/14 6\' 1"  (1.854 m)    Weight: Wt Readings from Last 1 Encounters:  06/06/14 180 lb (81.647 kg)    Ideal Body Weight: 172#  % Ideal Body Weight: 105%  Wt Readings from Last 10 Encounters:  06/06/14 180 lb (81.647 kg)  05/26/14 180 lb (81.647 kg)    Usual Body Weight: 210#  % Usual Body Weight: 86%  BMI:  22.4. Normal weight range.   Estimated Nutritional Needs: Kcal: 2000-2200 Protein: 90-100 grams Fluid: 2.0-2.2 L  Skin: closed rt leg incision  Diet Order: Diet Carb Modified  EDUCATION NEEDS: -Education needs addressed   Intake/Output Summary (Last 24 hours) at 06/07/14 0954 Last data filed at 06/07/14 0834  Gross per 24 hour  Intake   1980 ml  Output   1700 ml  Net    280 ml    Last BM: PTA  Labs:   Recent Labs Lab 06/06/14 1034 06/07/14 0540  NA 131* 133*  K 4.4 3.9  CL 96 100  CO2 26 27  BUN 11 6  CREATININE 0.71 0.68  CALCIUM 9.0 8.4  GLUCOSE 298* 251*    CBG (last 3)   Recent Labs  06/06/14 2125 06/07/14  0540 06/07/14 0630  GLUCAP 213* 225* 247*   Scheduled Meds: . amLODipine  10 mg Oral QHS  . aspirin  81 mg Oral Daily  . busPIRone  15 mg Oral QHS  . carvedilol  12.5 mg Oral BID WC  . cefUROXime (ZINACEF)  IV  1.5 g Intravenous Q12H  . clopidogrel  75 mg Oral Daily  . divalproex  250 mg Oral BID  . docusate sodium  100 mg Oral Daily  . enoxaparin (LOVENOX) injection  30 mg Subcutaneous Q24H  . feeding supplement (ENSURE ENLIVE)  237 mL Oral BID  BM  . insulin aspart  0-9 Units Subcutaneous TID WC  . insulin glargine  10 Units Subcutaneous QHS  . memantine  10 mg Oral BID  . metFORMIN  1,000 mg Oral Q supper  . oxybutynin  10 mg Oral QHS  . pantoprazole  40 mg Oral Daily  . risperiDONE  0.25 mg Oral QHS  . sertraline  100 mg Oral Daily  . tamsulosin  0.4 mg Oral QHS    Continuous Infusions: . sodium chloride 125 mL/hr at 06/07/14 4128    Past Medical History  Diagnosis Date  . Hypertension   . Hypercholesteremia   . Peripheral vascular disease   . Bipolar disorder   . Type II diabetes mellitus   . Arthritis     "hands" (06/06/2014)  . Rheumatoid arthritis   . PAD (peripheral artery disease)     Past Surgical History  Procedure Laterality Date  . Transurethral resection of prostate  2012  . Knee arthroscopy Right 1970    [place]  . Below knee leg amputation Right 06/06/2014  . Balloon angioplasty, artery Right     attempted balloon angioplasty and stenting of the anterior tibial artery that apparently was unsuccessful. [name] 05/26/2014    Lekeisha Arenas A. Mayford Knife, RD, LDN, CDE Pager: [phone] After hours Pager: [phone]

## 2014-06-07 NOTE — Progress Notes (Signed)
Inpatient Diabetes Program Recommendations  AACE/ADA: New Consensus Statement on Inpatient Glycemic Control (2013)  Target Ranges:  Prepandial:   less than 140 mg/dL      Peak postprandial:   less than 180 mg/dL (1-2 hours)      Critically ill patients:  140 - 180 mg/dL   Reason for Visit: Hyperglycemia  Diabetes history: DM2 Outpatient Diabetes medications: Lantus 10 QHS, Novolog resistant bid, metformin 1000 mg QD Current orders for Inpatient glycemic control: Lantus 10 units QHS, metformin 1000 mg Q supper, Novolog sensitive tidwc  Blood sugars in 200s. Needs tighter control for healing.  Inpatient Diabetes Program Recommendations Insulin - Basal: Increase Lantus to 15 units QHS Correction (SSI): Increase Novolog to moderate tidwc and hs HgbA1C: Pending  Note: Will await HgbA1C results. Thank you. Ailene Ards, RD, LDN, CDE Inpatient Diabetes Coordinator [phone]

## 2014-06-07 NOTE — Clinical Social Work Note (Signed)
Clinical Social Work Department BRIEF PSYCHOSOCIAL ASSESSMENT 06/07/2014  Patient:  Terry Green, Terry Green     Account Number:  [id number]     Admit date:  06/06/2014  Clinical Social Worker:  Merlyn Lot, CLINICAL SOCIAL WORKER  Date/Time:  06/07/2014 02:45 PM  Referred by:  Physician  Date Referred:  06/07/2014 Referred for  SNF Placement   Other Referral:   Interview type:  Patient Other interview type:   family at bedside (sister, brothers)    PSYCHOSOCIAL DATA Living Status:  FACILITY Admitted from facility:  [facility] [place] Level of care:  Skilled Nursing Facility Primary support name:  Terry Green Primary support relationship to patient:  CHILD, ADULT Degree of support available:   high level of support- 3 family members at bedside    CURRENT CONCERNS Current Concerns  Post-Acute Placement   Other Concerns:    SOCIAL WORK ASSESSMENT / PLAN CSW spoke with patinet concerning return to [facility] [place] at time of DC.  Pt is agreeable to returning to rehab- pt states that he has been at rehab for a long time and has been to other facilities in the past.  CSW confirmed pt is from [facility] [place] with the facility and that he can return.   Assessment/plan status:  Psychosocial Support/Ongoing Assessment of Needs Other assessment/ plan:   FL2 update   Information/referral to community resources:    PATIENT'S/FAMILY'S RESPONSE TO PLAN OF CARE: Patient was somewhat lethargic at time of the interview but was agreeable to returning to SNF and expressed understanding that he will need continued rehab after his recent amputation.  Family was extremely supportive of pt return to rehab.       Merlyn Lot, LCSWA Clinical Social Worker [phone]

## 2014-06-07 NOTE — Clinical Social Work Note (Signed)
Pt is from [facility]- pt is agreeable to return.  Full assessment to follow.  Merlyn Lot, LCSWA Clinical Social Worker [phone]

## 2014-06-07 NOTE — Progress Notes (Signed)
   VASCULAR SURGERY ASSESSMENT & PLAN:  * 1 Day Post-Op s/p: Right BKA  *  I have explained to him the importance of keeping his knee straight  * Begin dressing changes  SUBJECTIVE: Pain adequately controlled.  PHYSICAL EXAM: Filed Vitals:   06/06/14 1700 06/06/14 1731 06/07/14 0006 06/07/14 0330  BP: 152/66 160/64 169/76 144/53  Pulse: 60 61 80 77  Temp:  97.5 F (36.4 C)  98.2 F (36.8 C)  TempSrc:  Oral  Oral  Resp: 19 20 18 18   Weight:      SpO2: 100% 100%  94%   Some drainage on the dressing so I changed it. Incision looks good.  LABS: Lab Results  Component Value Date   WBC 11.3* 06/07/2014   HGB 7.6* 06/07/2014   HCT 24.7* 06/07/2014   MCV 65.2* 06/07/2014   PLT 327 06/07/2014   CBG (last 3)   Recent Labs  06/06/14 2125 06/07/14 0540 06/07/14 0630  GLUCAP 213* 225* 247*    Active Problems:   PAD (peripheral artery disease)   06/09/14 Beeper: Cari Caraway 06/07/2014

## 2014-06-07 NOTE — Evaluation (Signed)
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 PMH: DM, PAD, PVD, HTN, bipolar, RA   Clinical Impression  Pt was able to perform bathing and dressing at bed level with set up prior to admission.  He required min assist for transfers with sliding board including to toilet and used a manual w/c for mobility.  Pt presents with pain, generalized weakness and decreased sitting balance requiring B UE support at EOB. He requires mod to max assist for bathing and dressing at bed level. Plan is for return to SNF.  Will defer further OT to SNF.     Follow Up Recommendations  SNF    Equipment Recommendations       Recommendations for Other Services       Precautions / Restrictions Precautions Precautions: Fall Restrictions Weight Bearing Restrictions: No      Mobility Bed Mobility Overal bed mobility: Needs Assistance Bed Mobility: Rolling;Sidelying to Sit;Sit to Sidelying Rolling: Min assist Sidelying to sit: Min assist;HOB elevated Supine to sit: Min assist Sit to supine: Min assist Sit to sidelying: Min assist General bed mobility comments:  pt with heavy use of rail and min A to elevate trunk  Transfers Pt declined, had just returned to bed.     Balance Overall balance assessment: Needs assistance Sitting-balance support: Bilateral upper extremity supported Sitting balance-Leahy Scale: Fair                                 ADL Overall ADL's : Needs assistance/impaired Eating/Feeding: Independent;Bed level   Grooming: Wash/dry hands;Wash/dry face;Set up;Bed level   Upper Body Bathing: Minimal assitance;Bed level   Lower Body Bathing: Bed level;Maximal assistance   Upper Body Dressing : Bed level;Minimal assistance   Lower Body Dressing: Bed level;Maximal assistance                        Vision     Perception     Praxis      Pertinent Vitals/Pain Pain Assessment: 0-10 Pain Score: 7  Faces Pain Scale: Hurts even more Pain Location: R LE Pain Descriptors / Indicators: Guarding;Operative site guarding Pain Intervention(s): Limited activity within patient's tolerance;Premedicated before session;Repositioned     Hand Dominance Right   Extremity/Trunk Assessment Upper Extremity Assessment Upper Extremity Assessment: Overall WFL for tasks assessed   Lower Extremity Assessment Lower Extremity Assessment: Defer to PT evaluation    Cervical / Trunk Assessment Cervical / Trunk Assessment: Kyphotic   Communication Communication Communication: No difficulties   Cognition Arousal/Alertness: Awake/alert Behavior During Therapy: WFL for tasks assessed/performed;Anxious Overall Cognitive Status: No family/caregiver present to determine baseline cognitive functioning                     General Comments       Exercises      Shoulder Instructions      Home Living Family/patient expects to be discharged to:: Skilled nursing facility                                 Additional Comments: Pt is a resident of [facility]      Prior Functioning/Environment Level of Independence: Needs assistance  Gait / Transfers Assistance Needed:  Pt required min assist for sliding board transfer to w/c.  Propelled w/c independently.  Pt has not ambulated in 3 months. ADL's / Homemaking Assistance Needed: Pt performs bathing and dressing at bed level with set up, min assist for toilet transfer with transfer board per his report        OT Diagnosis: Generalized weakness;Acute pain   OT Problem List:     OT Treatment/Interventions:      OT Goals(Current goals can be found in the care plan section) Acute Rehab OT Goals Patient Stated Goal: return to SNF  OT Frequency:     Barriers to D/C:            Co-evaluation               End of Session    Activity Tolerance: Patient limited by pain Patient left: in bed;with call bell/phone within reach;with bed alarm set   Time: 1125-1140 OT Time Calculation (min): 15 min Charges:  OT General Charges $OT Visit: 1 Procedure OT Evaluation $Initial OT Evaluation Tier I: 1 Procedure G-Codes:    Evern Bio 06/07/2014, 1:59 PM

## 2014-06-08 DIAGNOSIS — E43 Unspecified severe protein-calorie malnutrition: Secondary | ICD-10-CM | POA: Insufficient documentation

## 2014-06-08 LAB — BASIC METABOLIC PANEL
ANION GAP: 10 (ref 5–15)
BUN: 6 mg/dL (ref 6–23)
CHLORIDE: 98 mmol/L (ref 96–112)
CO2: 26 mmol/L (ref 19–32)
CREATININE: 0.67 mg/dL (ref 0.50–1.35)
Calcium: 8.4 mg/dL (ref 8.4–10.5)
GFR calc Af Amer: >90 mL/min (ref >90)
GFR calc non Af Amer: >90 mL/min (ref >90)
Glucose, Bld: 295 mg/dL — ABNORMAL HIGH (ref 70–99)
Potassium: 3.7 mmol/L (ref 3.5–5.1)
Sodium: 134 mmol/L — ABNORMAL LOW (ref 135–145)

## 2014-06-08 LAB — CBC
HEMATOCRIT: 23.8 % — AB (ref 39.0–52.0)
Hemoglobin: 7.4 g/dL — ABNORMAL LOW (ref 13.0–17.0)
MCH: 20.2 pg — AB (ref 26.0–34.0)
MCHC: 31.1 g/dL (ref 30.0–36.0)
MCV: 65 fL — AB (ref 78.0–100.0)
PLATELETS: 307 10*3/uL (ref 150–400)
RBC: 3.66 MIL/uL — AB (ref 4.22–5.81)
RDW: 17.9 % — ABNORMAL HIGH (ref 11.5–15.5)
WBC: 10.2 10*3/uL (ref 4.0–10.5)

## 2014-06-08 LAB — GLUCOSE, CAPILLARY
GLUCOSE-CAPILLARY: 302 mg/dL — AB (ref 70–99)
Glucose-Capillary: 267 mg/dL — ABNORMAL HIGH (ref 70–99)
Glucose-Capillary: 235 mg/dL — ABNORMAL HIGH (ref 70–99)

## 2014-06-08 LAB — HEMOGLOBIN A1C
Hgb A1c MFr Bld: 11.2 % — ABNORMAL HIGH (ref 4.8–5.6)
Mean Plasma Glucose: 275 mg/dL

## 2014-06-08 LAB — PREPARE RBC (CROSSMATCH)

## 2014-06-08 MED ORDER — SODIUM CHLORIDE 0.9 % IV SOLN
Freq: Once | INTRAVENOUS | Status: AC
  Administered 2014-06-08: 16:00:00 via INTRAVENOUS

## 2014-06-08 NOTE — Progress Notes (Signed)
Inpatient Diabetes Program Recommendations  AACE/ADA: New Consensus Statement on Inpatient Glycemic Control (2013)  Target Ranges:  Prepandial:   less than 140 mg/dL      Peak postprandial:   less than 180 mg/dL (1-2 hours)      Critically ill patients:  140 - 180 mg/dL   Consider increasing Lantus to 15 units and Novolog to moderate scale +  HS scale per Glycemic Control Order set. Thank you  Piedad Climes BSN, RN,CDE Inpatient Diabetes Coordinator [phone] (team pager)

## 2014-06-08 NOTE — Progress Notes (Signed)
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 PMH: DM, PAD, PVD, HTN, bipolar, RA    PT Comments    Pt with contracture RLE with inability to extend knee. Pt EOB with knee flexed and KI off on arrival. Attempted knee extension in supine and sitting unable to achieve extension to apply KI. Pt educated for importance of extension, assist with transfers, mobility and plan. Pt with generalized weakness and continues to require extensive assist with all transfers and OOB. Will continue to follow.   Follow Up Recommendations  SNF     Equipment Recommendations       Recommendations for Other Services       Precautions / Restrictions Precautions Precautions: Fall Restrictions Weight Bearing Restrictions: Yes RLE Weight Bearing: Non weight bearing Other Position/Activity Restrictions: pt maintaining flexion RLE with KI off on arrival and unable to achieve extension to replace KI    Mobility  Bed Mobility   Bed Mobility: Rolling;Sidelying to Sit;Sit to Sidelying Rolling: Min assist Sidelying to sit: Supervision     Sit to sidelying: Supervision General bed mobility comments: cues for sequence with use of rail increased time for transfers and min assist to roll for pericare and linen change as pt incontinent of urine on arrival  Transfers Overall transfer level: Needs assistance   Transfers: Sit to/from Stand;Stand Pivot Transfers Sit to Stand: Max assist;+2 physical assistance Stand pivot transfers: Max assist;+2 physical assistance       General transfer comment: attempted squat pivot but pt unable to shift weight and perform, unable to stand with RW. With 2 person assist and Left knee blocked with elevated bed height able to stand and pivot to chair with use of belt and pad with max cues for  sequence  Ambulation/Gait Ambulation/Gait assistance:  (unable)               Stairs            Wheelchair Mobility    Modified Rankin (Stroke Patients Only)       Balance Overall balance assessment: Needs assistance   Sitting balance-Leahy Scale: Fair       Standing balance-Leahy Scale: Poor                      Cognition Arousal/Alertness: Awake/alert Behavior During Therapy: Flat affect Overall Cognitive Status: No family/caregiver present to determine baseline cognitive functioning                      Exercises      General Comments        Pertinent Vitals/Pain Pain Score: 7  Pain Location: RLE Pain Descriptors / Indicators: Aching;Guarding    Home Living                      Prior Function            PT Goals (current goals can now be found in the care plan section) Progress towards PT goals: Progressing toward goals (limited by pain, cognition and contraction)    Frequency  Min 3X/week    PT Plan Current plan remains appropriate    Co-evaluation             End of Session Equipment Utilized During Treatment: Gait belt Activity Tolerance: Patient limited by pain Patient  left: in chair;with call bell/phone within reach;with chair alarm set     Time: [phone] PT Time Calculation (min) (ACUTE ONLY): 23 min  Charges:  $Therapeutic Activity: 23-37 mins                    G Codes:      Delorse Lek 06-09-2014, 10:14 AM Delaney Meigs, PT [phone]

## 2014-06-08 NOTE — Progress Notes (Addendum)
[facility] [place]  Subjective  - Doing OK it is very hard for him to try and extend hi right knee.  He states it hurts his quads.    Objective 148/57 70 98.7 F (37.1 C) (Oral) 22 96%  Intake/Output Summary (Last 24 hours) at 06/08/14 1136 Last data filed at 06/08/14 0449  Gross per 24 hour  Intake      0 ml  Output   1800 ml  Net  -1800 ml    Right BKA incision is clean and dry, healing well. Clean dry dressing applied  Assessment/Planning: POD #2 Right BKA  BKA healing well Sever Malnutrition noted it has been recommended that he be given -Glucerna Shake po TID, each supplement provides 220 kcal and 10 grams of protein.   HGB is 7.4 down from 7.6 today  HR 64 BP 148/57 O2 SAT 96% RA. I will transfuse 2 units of PRBC chronic anemia of unknown cause with min. Acute surgical blood loss. Plan D/C to SNF tomorrow  Clinton Gallant [facility] 06/08/2014 11:36 AM --  Laboratory Lab Results:  Recent Labs  06/07/14 0540 06/08/14 0354  WBC 11.3* 10.2  HGB 7.6* 7.4*  HCT 24.7* 23.8*  PLT 327 307   BMET  Recent Labs  06/07/14 0540 06/08/14 0354  NA 133* 134*  K 3.9 3.7  CL 100 98  CO2 27 26  GLUCOSE 251* 295*  BUN 6 6  CREATININE 0.68 0.67  CALCIUM 8.4 8.4   Agree with above.  Agree with transfer to SNF tomorrow. I again emphasized the importance of trying to keep his right knee straight.  [name], MD, FACS Beeper [phone]

## 2014-06-08 NOTE — Progress Notes (Signed)
Care was assumed right after patient's first transfusion has been completed. He tolereated it well without any reaction observed. Vital signs checked Blood pressure 152/65, pulse 72, temperature 98 F (36.7 C), temperature source Oral, resp. rate 18, weight 81.647 kg (180 lb), SpO2 99 %.

## 2014-06-08 NOTE — Progress Notes (Signed)
NT came in to room to find pt without knee immobilizer on. Nurse educated patient on importance of knee immobilizer in preventing muscles from contracting. Pt alert and oriented x4. Pt refused immobilizer and stated "it was too painful".  Will continue to monitor.   Edgardo Roys

## 2014-06-09 DIAGNOSIS — Z87891 Personal history of nicotine dependence: Secondary | ICD-10-CM | POA: Diagnosis not present

## 2014-06-09 DIAGNOSIS — F0390 Unspecified dementia without behavioral disturbance: Secondary | ICD-10-CM | POA: Diagnosis not present

## 2014-06-09 DIAGNOSIS — E119 Type 2 diabetes mellitus without complications: Secondary | ICD-10-CM | POA: Diagnosis not present

## 2014-06-09 DIAGNOSIS — Z89511 Acquired absence of right leg below knee: Secondary | ICD-10-CM | POA: Diagnosis not present

## 2014-06-09 DIAGNOSIS — Z961 Presence of intraocular lens: Secondary | ICD-10-CM | POA: Diagnosis not present

## 2014-06-09 DIAGNOSIS — S88011A Complete traumatic amputation at knee level, right lower leg, initial encounter: Secondary | ICD-10-CM | POA: Diagnosis not present

## 2014-06-09 DIAGNOSIS — D509 Iron deficiency anemia, unspecified: Secondary | ICD-10-CM | POA: Diagnosis not present

## 2014-06-09 DIAGNOSIS — E1065 Type 1 diabetes mellitus with hyperglycemia: Secondary | ICD-10-CM | POA: Diagnosis not present

## 2014-06-09 DIAGNOSIS — F29 Unspecified psychosis not due to a substance or known physiological condition: Secondary | ICD-10-CM | POA: Diagnosis not present

## 2014-06-09 DIAGNOSIS — M869 Osteomyelitis, unspecified: Secondary | ICD-10-CM | POA: Diagnosis not present

## 2014-06-09 DIAGNOSIS — E1051 Type 1 diabetes mellitus with diabetic peripheral angiopathy without gangrene: Secondary | ICD-10-CM | POA: Diagnosis not present

## 2014-06-09 DIAGNOSIS — Z48812 Encounter for surgical aftercare following surgery on the circulatory system: Secondary | ICD-10-CM | POA: Diagnosis not present

## 2014-06-09 DIAGNOSIS — R279 Unspecified lack of coordination: Secondary | ICD-10-CM | POA: Diagnosis not present

## 2014-06-09 DIAGNOSIS — I96 Gangrene, not elsewhere classified: Secondary | ICD-10-CM | POA: Diagnosis not present

## 2014-06-09 DIAGNOSIS — I739 Peripheral vascular disease, unspecified: Secondary | ICD-10-CM | POA: Diagnosis not present

## 2014-06-09 DIAGNOSIS — F319 Bipolar disorder, unspecified: Secondary | ICD-10-CM | POA: Diagnosis not present

## 2014-06-09 DIAGNOSIS — F419 Anxiety disorder, unspecified: Secondary | ICD-10-CM | POA: Diagnosis not present

## 2014-06-09 DIAGNOSIS — F329 Major depressive disorder, single episode, unspecified: Secondary | ICD-10-CM | POA: Diagnosis not present

## 2014-06-09 DIAGNOSIS — M25561 Pain in right knee: Secondary | ICD-10-CM | POA: Diagnosis not present

## 2014-06-09 DIAGNOSIS — I10 Essential (primary) hypertension: Secondary | ICD-10-CM | POA: Diagnosis not present

## 2014-06-09 LAB — TYPE AND SCREEN
ABO/RH(D): B POS
Antibody Screen: NEGATIVE
UNIT DIVISION: 0
UNIT DIVISION: 0

## 2014-06-09 LAB — GLUCOSE, CAPILLARY
Glucose-Capillary: 243 mg/dL — ABNORMAL HIGH (ref 70–99)
Glucose-Capillary: 172 mg/dL — ABNORMAL HIGH (ref 70–99)
Glucose-Capillary: 150 mg/dL — ABNORMAL HIGH (ref 70–99)
Glucose-Capillary: 176 mg/dL — ABNORMAL HIGH (ref 70–99)

## 2014-06-09 LAB — ABO/RH: ABO/RH(D): B POS

## 2014-06-09 MED ORDER — HYDROCODONE-ACETAMINOPHEN 7.5-325 MG PO TABS: 1.0000 | ORAL_TABLET | ORAL | Status: DC | PRN

## 2014-06-09 NOTE — Progress Notes (Signed)
Medicare Important Message given? YES  (If response is "NO", the following Medicare IM given date fields will be blank)  Date Medicare IM given: 06/09/14 Medicare IM given by:  Anaiz Qazi  

## 2014-06-09 NOTE — Progress Notes (Addendum)
  Progress Note    06/09/2014 10:00 AM 3 Days Post-Op  Subjective:  C/o pain but pain meds help  Afebrile VSS  96% RA  Filed Vitals:   06/09/14 0440  BP: 170/82  Pulse: 68  Temp: 98.4 F (36.9 C)  Resp: 18    Physical Exam: Incisions:  C/d/i with staples in tact. Extremities:  Unable to straighten knee  CBC    Component Value Date/Time   WBC 10.2 06/08/2014 0354   RBC 3.66* 06/08/2014 0354   HGB 7.4* 06/08/2014 0354   HCT 23.8* 06/08/2014 0354   PLT 307 06/08/2014 0354   MCV 65.0* 06/08/2014 0354   MCH 20.2* 06/08/2014 0354   MCHC 31.1 06/08/2014 0354   RDW 17.9* 06/08/2014 0354    BMET    Component Value Date/Time   NA 134* 06/08/2014 0354   K 3.7 06/08/2014 0354   CL 98 06/08/2014 0354   CO2 26 06/08/2014 0354   GLUCOSE 295* 06/08/2014 0354   BUN 6 06/08/2014 0354   CREATININE 0.67 06/08/2014 0354   CALCIUM 8.4 06/08/2014 0354   GFRNONAA >90 06/08/2014 0354   GFRAA >90 06/08/2014 0354    INR No results found for: INR   Intake/Output Summary (Last 24 hours) at 06/09/14 1000 Last data filed at 06/09/14 0830  Gross per 24 hour  Intake    751 ml  Output    650 ml  Net    101 ml     Assessment/Plan:  74 y.o. male is s/p right below knee amputation  3 Days Post-Op  -pt incision is c/d/i -received 2 units PRBC's yesterday-pt doing well  -transfer to SNF today -rehab to work on straightening knee -f/u with Dr. Edilia Bo in 4 weeks.   Doreatha Massed, PA-C [facility] [phone] 06/09/2014 10:00 AM  Agree with above Stump looks good To skilled nursing facility today

## 2014-06-09 NOTE — Significant Event (Signed)
Report called to [facility] at [facility] in [place].  Discussed patients course of care and recent transfusion 2U PRBC on 3/24.  Patient aware of transport [facility].  No personal belongings in room, will call family and let them know of his transport.

## 2014-06-09 NOTE — Clinical Social Work Note (Signed)
Patient will discharge to [facility] Anticipated discharge date:06/09/14 Family notified: Ms. Jennette Kettle Transportation by Sharin Mons- scheduled for 1pm  CSW signing off.  Merlyn Lot, LCSWA Clinical Social Worker [phone]

## 2014-06-09 NOTE — Discharge Summary (Signed)
Discharge Summary    Terry Green March 20, 1940 74 y.o. male  [id number]  Admission Date: 06/06/2014  Discharge Date: 06/09/14  Physician: Chuck Hint, MD  Admission Diagnosis: Gangrene right foot I96   HPI:   This is a 74 y.o. male who apparently developed wounds on the toes of his right foot over a month ago. He was seen at [facility]. He underwent a CT angiogram the results of which are described below. According to his daughter, he then underwent attempted balloon angioplasty and stenting of the anterior tibial artery that apparently was unsuccessful. Consideration was being given to bypass however he was felt to be at high-risk and therefore sent for a second opinion.  He resides in a skilled nursing facility and his activity is very limited. I do not get any clear-cut history of claudication or rest pain. He denies fever or chills.  Hospital Course:  The patient was admitted to the hospital and taken to the operating room on 06/06/2014 and underwent: Right BKA on 06/06/14.     The pt tolerated the procedure well and was transported to the PACU in good condition.   By POD 1, he was not able to straighten his knee.  The importance of this was stressed to the pt.  By POD 2, he did have acute surgical blood loss anemia and received 2 units of PRBC's.  (he has hx of chronic anemia of unknown cause as well).   He does have severe nutrition and changes were made to this shakes (see instructions below).  The remainder of the hospital course consisted of increasing mobilization and increasing intake of solids without difficulty.  CBC    Component Value Date/Time   WBC 10.2 06/08/2014 0354   RBC 3.66* 06/08/2014 0354   HGB 7.4* 06/08/2014 0354   HCT 23.8* 06/08/2014 0354   PLT 307 06/08/2014 0354   MCV 65.0* 06/08/2014 0354   MCH 20.2* 06/08/2014 0354   MCHC 31.1 06/08/2014 0354   RDW 17.9* 06/08/2014 0354    BMET    Component Value  Date/Time   NA 134* 06/08/2014 0354   K 3.7 06/08/2014 0354   CL 98 06/08/2014 0354   CO2 26 06/08/2014 0354   GLUCOSE 295* 06/08/2014 0354   BUN 6 06/08/2014 0354   CREATININE 0.67 06/08/2014 0354   CALCIUM 8.4 06/08/2014 0354   GFRNONAA >90 06/08/2014 0354   GFRAA >90 06/08/2014 0354      Discharge Instructions    Call MD for:  redness, tenderness, or signs of infection (pain, swelling, bleeding, redness, odor or green/yellow discharge around incision site)    Complete by:  As directed      Call MD for:  severe or increased pain, loss or decreased feeling  in affected limb(s)    Complete by:  As directed      Call MD for:  temperature >100.5    Complete by:  As directed      Discharge instructions    Complete by:  As directed   Rehab to work on straightening knee and mobilizing.     Discharge wound care:    Complete by:  As directed   Use 4x4's, kerlix to wrap stump.     Resume previous diet    Complete by:  As directed      may wash over wound with mild soap and water    Complete by:  As directed  Discharge Diagnosis:  Gangrene right foot I96  Secondary Diagnosis: Patient Active Problem List   Diagnosis Date Noted  . Protein-calorie malnutrition, severe 06/08/2014  . PAD (peripheral artery disease) 06/06/2014   Past Medical History  Diagnosis Date  . Hypertension   . Hypercholesteremia   . Peripheral vascular disease   . Bipolar disorder   . Type II diabetes mellitus   . Arthritis     "hands" (06/06/2014)  . Rheumatoid arthritis   . PAD (peripheral artery disease)        Medication List    STOP taking these medications        oxycodone 5 MG capsule  Commonly known as:  OXY-IR      TAKE these medications        amLODipine 10 MG tablet  Commonly known as:  NORVASC  Take 10 mg by mouth at bedtime.     aspirin 81 MG chewable tablet  Chew 81 mg by mouth daily.     busPIRone 15 MG tablet  Commonly known as:  BUSPAR  Take 15 mg by  mouth at bedtime.     carvedilol 12.5 MG tablet  Commonly known as:  COREG  Take 12.5 mg by mouth 2 (two) times daily with a meal.     clopidogrel 75 MG tablet  Commonly known as:  PLAVIX  Take 75 mg by mouth daily.     divalproex 250 MG 24 hr tablet  Commonly known as:  DEPAKOTE ER  Take 250 mg by mouth 2 (two) times daily.     HYDROcodone-acetaminophen 7.5-325 MG per tablet  Commonly known as:  NORCO  Take 1 tablet by mouth every 4 (four) hours as needed for moderate pain.     insulin aspart 100 UNIT/ML FlexPen  Commonly known as:  NOVOLOG  Inject 0-20 Units into the skin 2 (two) times daily. 6:30am and 4pm per sliding scale:  CBG 150-249 3 units, 250-349 5 units, 350-449 8 units, 450-549 14 units, 550-551 20 units >551 call MD     Insulin Glargine 100 UNIT/ML Solostar Pen  Commonly known as:  LANTUS  Inject 10 Units into the skin at bedtime.     memantine 10 MG tablet  Commonly known as:  NAMENDA  Take 10 mg by mouth 2 (two) times daily.     metFORMIN 1000 MG tablet  Commonly known as:  GLUCOPHAGE  Take 1,000 mg by mouth daily.     oxybutynin 10 MG 24 hr tablet  Commonly known as:  DITROPAN-XL  Take 10 mg by mouth at bedtime.     risperiDONE 0.25 MG tablet  Commonly known as:  RISPERDAL  Take 0.25 mg by mouth at bedtime.     sertraline 100 MG tablet  Commonly known as:  ZOLOFT  Take 100 mg by mouth daily.     tamsulosin 0.4 MG Caps capsule  Commonly known as:  FLOMAX  Take 0.4 mg by mouth at bedtime.        Prescriptions given: Vicodin #30 No Refill  Instructions: 1.  Start Glucerna shake po tid each supplement provides 220 kcal and 10 grams of protein 2.  D/c Ensure Enlive po BID, each supplement provides 350 kcal and 20 grams of protein 3.  Continue rehab for mobilization and straightening his right knee 4.  Continue SSI for DM  Disposition: SNF  Patient's condition: is Good  Follow up: 1. Dr. Edilia Bo in 4 weeks   Doreatha Massed,  PA-C [facility] [phone] 06/09/2014  10:12 AM

## 2014-06-12 ENCOUNTER — Telehealth: Payer: Self-pay | Admitting: Vascular Surgery

## 2014-06-12 NOTE — Telephone Encounter (Addendum)
-----   Message from Dara Lords, [place] sent at 06/09/2014 10:11 AM EDT ----- S/p right BKA.  F/u with Dr. Edilia Bo in 4 weeks.  Thanks, Samantha  03/28/16[facility] wrote message down for Debbora Lacrosse to notify, dpm

## 2014-06-15 DIAGNOSIS — F329 Major depressive disorder, single episode, unspecified: Secondary | ICD-10-CM | POA: Diagnosis not present

## 2014-06-15 DIAGNOSIS — F419 Anxiety disorder, unspecified: Secondary | ICD-10-CM | POA: Diagnosis not present

## 2014-06-15 DIAGNOSIS — F29 Unspecified psychosis not due to a substance or known physiological condition: Secondary | ICD-10-CM | POA: Diagnosis not present

## 2014-06-15 DIAGNOSIS — F0390 Unspecified dementia without behavioral disturbance: Secondary | ICD-10-CM | POA: Diagnosis not present

## 2014-06-23 DIAGNOSIS — E119 Type 2 diabetes mellitus without complications: Secondary | ICD-10-CM | POA: Diagnosis not present

## 2014-06-23 DIAGNOSIS — F0390 Unspecified dementia without behavioral disturbance: Secondary | ICD-10-CM | POA: Diagnosis not present

## 2014-06-23 DIAGNOSIS — I739 Peripheral vascular disease, unspecified: Secondary | ICD-10-CM | POA: Diagnosis not present

## 2014-06-23 DIAGNOSIS — I10 Essential (primary) hypertension: Secondary | ICD-10-CM | POA: Diagnosis not present

## 2014-06-28 DIAGNOSIS — F329 Major depressive disorder, single episode, unspecified: Secondary | ICD-10-CM | POA: Diagnosis not present

## 2014-06-28 DIAGNOSIS — F419 Anxiety disorder, unspecified: Secondary | ICD-10-CM | POA: Diagnosis not present

## 2014-06-28 DIAGNOSIS — F29 Unspecified psychosis not due to a substance or known physiological condition: Secondary | ICD-10-CM | POA: Diagnosis not present

## 2014-06-28 DIAGNOSIS — F0390 Unspecified dementia without behavioral disturbance: Secondary | ICD-10-CM | POA: Diagnosis not present

## 2014-07-04 ENCOUNTER — Encounter: Payer: Self-pay | Admitting: Vascular Surgery

## 2014-07-05 ENCOUNTER — Ambulatory Visit (INDEPENDENT_AMBULATORY_CARE_PROVIDER_SITE_OTHER): Payer: Self-pay | Admitting: Vascular Surgery

## 2014-07-05 ENCOUNTER — Encounter: Payer: Self-pay | Admitting: Vascular Surgery

## 2014-07-05 VITALS — BP 148/72 | HR 63 | Ht 73.0 in | Wt 180.0 lb

## 2014-07-05 DIAGNOSIS — I739 Peripheral vascular disease, unspecified: Secondary | ICD-10-CM

## 2014-07-05 DIAGNOSIS — Z48812 Encounter for surgical aftercare following surgery on the circulatory system: Secondary | ICD-10-CM

## 2014-07-05 NOTE — Progress Notes (Signed)
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  HPI: Terry Green is a 74 y.o. male who presented with gangrene of the right foot. 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 He comes in for a 4 week follow up visit. His only complaint is that he developed a blister on his left heel from a shoe. He denies fever or chills. He's had no problems with the amputation site denies significant pain.   Current Outpatient Prescriptions  Medication Sig Dispense Refill  . amLODipine (NORVASC) 10 MG tablet Take 10 mg by mouth at bedtime.     Marland Kitchen aspirin 81 MG chewable tablet Chew 81 mg by mouth daily.    . busPIRone (BUSPAR) 15 MG tablet Take 15 mg by mouth at bedtime.    . carvedilol (COREG) 12.5 MG tablet Take 12.5 mg by mouth 2 (two) times daily with a meal.    . clopidogrel (PLAVIX) 75 MG tablet Take 75 mg by mouth daily.    . divalproex (DEPAKOTE ER) 250 MG 24 hr tablet Take 250 mg by mouth 2 (two) times daily.    Marland Kitchen HYDROcodone-acetaminophen (NORCO) 7.5-325 MG per tablet Take 1 tablet by mouth every 4 (four) hours as needed for moderate pain. 30 tablet 0  . insulin aspart (NOVOLOG) 100 UNIT/ML FlexPen Inject 0-20 Units into the skin 2 (two) times daily. 6:30am and 4pm per sliding scale:  CBG 150-249 3 units, 250-349 5 units, 350-449 8 units, 450-549 14 units, 550-551 20 units >551 call MD    . Insulin Glargine (LANTUS) 100 UNIT/ML Solostar Pen Inject 10 Units into the skin at bedtime.     . memantine (NAMENDA) 10 MG tablet Take 10 mg by mouth 2 (two) times daily.    . metFORMIN (GLUCOPHAGE) 1000 MG tablet Take 1,000 mg by mouth daily.    Marland Kitchen oxybutynin (DITROPAN-XL) 10 MG 24 hr tablet Take 10 mg by mouth at bedtime.    . risperiDONE (RISPERDAL) 0.25 MG tablet Take 0.25 mg by mouth at bedtime.    . sertraline (ZOLOFT) 100 MG tablet Take 100 mg by mouth daily.    .  tamsulosin (FLOMAX) 0.4 MG CAPS capsule Take 0.4 mg by mouth at bedtime.      No current facility-administered medications for this visit.   REVIEW OF SYSTEMS: Arly.Keller ] denotes positive finding; [  ] denotes negative finding  CARDIOVASCULAR:  [ ]  chest pain   [ ]  dyspnea on exertion    CONSTITUTIONAL:  [ ]  fever   [ ]  chills  PHYSICAL EXAM: Filed Vitals:   07/05/14 1358 07/05/14 1401  BP: 141/74 148/72  Pulse: 63   Height: 6\' 1"  (1.854 m)   Weight: 180 lb (81.647 kg)   SpO2: 100%    GENERAL: The patient is a well-nourished male, in no acute distress. The vital signs are documented above. CARDIOVASCULAR: There is a regular rate and rhythm. PULMONARY: There is good air exchange bilaterally without wheezing or rales. His right below the knee amputation site is healing nicely and we removed his staples in the office today. He has a wound on his left heel which is fairly superficial and the tissue here appears reasonably well perfused.  MEDICAL ISSUES: The patient is doing well status post right below the knee amputation. I have instructed the nursing facility on the wound care for his left heel  and I'll see him back in 3 weeks with ABIs at that time. If this wound is doing well but at that point I think we can write him a prescription for a prosthesis for the right leg. He knows to call sooner if he has problems. I have also instructed the skilled nursing facility to keep pressure off of the left heel.  Nikira Kushnir S [facility] Beeper: [phone]

## 2014-07-07 NOTE — Op Note (Signed)
PATIENT NAME:  Terry Green, Terry Green MR#:  [id number] DATE OF BIRTH:  02/04/1941  DATE OF PROCEDURE:  09/22/2012  PREOPERATIVE DIAGNOSIS: Osteomyelitis of left second toe.   POSTOPERATIVE DIAGNOSIS: Osteomyelitis of left second toe.   PROCEDURE PERFORMED: Interphalangeal amputation of the left second toe.   SURGEON: Renford Dills, MD   ANESTHESIA: General by LMA.   FLUIDS: Per anesthesia record.   ESTIMATED BLOOD LOSS: Minimal.   SPECIMEN: Distal second toe to pathology for permanent section.   INDICATIONS: [name] is a 74 year old gentleman who was referred to the office with ulceration, clinical osteomyelitis of the left second toe. He has undergone angiography, and he does not have an option for intervention or for revascularization with surgical bypass. He is, therefore, undergoing second toe amputation to eradicate the infected bone and subsequently ArtAssist device as well as the possibility of hyperbaric oxygen is being investigated. The risks and benefits were reviewed. All questions answered. The patient agrees to proceed.   DESCRIPTION OF PROCEDURE: The patient is taken to the operating room and placed in the supine position. After adequate general anesthesia is induced, appropriate invasive monitors are placed.  He is positioned supine, and his left foot is prepped and draped in a sterile fashion. Appropriate timeout is called.   The patient then underwent digital block with 0.5% Marcaine plain.  A circumferential incision is then created and the soft tissues elevated from the phalangeal.  The phalangeal was then resected.  A rongeur is used to remove bone back to the MP joint. The wound is then irrigated; 3-0 Vicryl is used to close the deeper tissues, and interrupted 3-0 nylon vertical mattress sutures are used to close the skin. Xeroform and a sterile dressing are applied.   The patient tolerated the procedure well, and there were no immediate complications.   He is in a nursing  home in [place], [facility], which is listed as [address] [place], [place].   ____________________________ Renford Dills, MD ggs:cb D: 09/22/2012 15:32:10 ET T: 09/22/2012 17:12:27 ET JOB#: [id number]  cc: Renford Dills, MD, <Dictator> [facility], Dr. Annett Fabian Latina Craver [facility] MD ELECTRONICALLY SIGNED 10/02/2012 11:12

## 2014-07-07 NOTE — Op Note (Signed)
PATIENT NAME:  Terry Green, Terry Green MR#:  [id number] DATE OF BIRTH:  10/01/1940  DATE OF PROCEDURE:  09/07/2012  PREOPERATIVE DIAGNOSIS: Atherosclerotic occlusive disease, bilateral lower extremities  with rest pain and ulceration of the left foot.   POSTOPERATIVE DIAGNOSIS: Atherosclerotic occlusive disease, bilateral lower extremities  with rest pain and ulceration of the left foot.   PROCEDURES PERFORMED: 1.  Abdominal aortogram.  2.  Left lower extremity distal runoff, third order catheter placement with additional third order.   SURGEON: Renford Dills, M.D.   SEDATION: Precedex drip with supplemental fentanyl. Continuous ECG, pulse oximetry and cardiopulmonary monitoring is performed throughout the entire procedure by the interventional radiology nurse. Total sedation time was 1 hour, 20 minutes.   ACCESS: A 5-French sheath, right common femoral artery.   FLUOROSCOPY TIME: 14.2 minutes.   CONTRAST USED: Isovue 75 mL.   INDICATIONS: Mr. Sookdeo is a 74 year old gentleman without presents to the office with ulceration and clinical findings consistent with osteomyelitis of the toe. He had nonpalpable pulses and noninvasive studies demonstrated significant atherosclerotic occlusive disease. He is undergoing angiography with the hope for intervention and/or as a preoperative study.  Risks and benefits were reviewed. All questions answered. The patient has agreed to proceed.   DESCRIPTION OF PROCEDURE: The patient is taken to special procedures and placed in the supine position. After adequate sedation is achieved both groins are prepped and draped in sterile fashion. Appropriate timeout is called.   Ultrasound is placed in a sterile sleeve. Ultrasound is utilized to evaluate the right common femoral artery. Femoral artery is echolucent and pulsatile indicating patency. Image is recorded for the permanent record. Under real-time visualization, after 1% lidocaine has been infiltrated in the soft  tissues, a micropuncture needle is inserted into the anterior wall of the common femoral artery, microwire followed by microsheath, J-wire followed by a 5-French sheath and 5-French pigtail catheter. The pigtail catheter is positioned at the level of T12 and AP projection of the aorta is obtained. Pigtail catheter is repositioned to above the bifurcation and a RAO projection of the pelvis is obtained. Using an Advantage wire, the aortic bifurcation is crossed and the pigtail catheter is negotiated into the SFA.  Oblique view of the left groin is obtained in the LAO projection. The detector is then returned to the AP and distal runoff is obtained. A straight catheter and wire are then negotiated first into the anterior tibial and then into the peroneal. Multiple attempts at crossing these vessels were unsuccessful. Hand injection of contrast was performed to verify catheter tip position. However, given the extensive nature of the tibial occlusive disease, there are several islands of patent tibial vessel and then re-occlusions.  Ultimately, magnified images at the foot demonstrate that the anterior tibial occludes. The peroneal is occluded throughout the majority of its course. He does appear to be some collateral flow distally, perhaps to a lateral tarsal branch. Dorsalis pedis and lateral plantar are nonvisualized throughout the course.   Sheath is then pulled back to the right side.  Oblique views obtained and Mynx device deployed. There are no immediate complications.   INTERPRETATION: The aorta iliacs and common femorals are widely patent. Profunda femoris on the left is widely patent. Superficial femoral artery on the left is patent. Popliteal artery is patent as well. Tibial vessels demonstrate diffuse disease throughout their course including at the level of the origin, in the midportion, and distally. Dorsalis pedis and lateral plantar are occluded throughout their course. There is no  consistent filling  of the pedal arch. The patient does not have significant intervention or surgical options at this time. ____________________________ Renford Dills, MD ggs:sb D: 09/07/2012 14:46:23 ET T: 09/07/2012 15:19:50 ET JOB#: [id number]  cc: Renford Dills, MD, <Dictator> Renford Dills MD ELECTRONICALLY SIGNED 09/21/2012 14:29

## 2014-07-07 NOTE — Op Note (Signed)
PATIENT NAME:  Terry Green, Terry Green MR#:  [id number] DATE OF BIRTH:  [birth year]  DATE OF PROCEDURE:  07/27/2012  PREOPERATIVE DIAGNOSIS: Atherosclerotic occlusive disease bilateral lower extremities with ulceration of the left lower extremity.   POSTOPERATIVE DIAGNOSIS: Atherosclerotic occlusive disease bilateral lower extremities with ulceration of the left lower extremity.   PROCEDURES PERFORMED:  1.  Abdominal aortogram.  2.  Left lower extremity distal runoff, third order catheter placement.  3.  Percutaneous transluminal angioplasty of the left peroneal, unsuccessful.   SURGEON: Levora Dredge, M.D.   SEDATION: Precedex drip.   ACCESS: A 6-French sheath right common femoral artery.   CONTRAST USED: Isovue 90 mL.   FLUORO TIME: 15 minutes.   INDICATIONS: Terry Green is a 74 year old gentleman who presented to the office with  ulceration of the second toe and active osteomyelitis. He had nonpalpable pulses and noninvasive studies were obtained which demonstrated multiple stenoses within the SFA and patent tibials distally. The risks and benefits for angiography and intervention were reviewed, all questions answered. The patient has agreed to proceed with angiography and intervention for limb salvage.   DESCRIPTION OF PROCEDURE: The patient is taken to the special procedure suite, placed in the supine position. After adequate sedation is achieved, he is positioned supine and his groins are prepped and draped in a sterile fashion. Ultrasound is placed in a sterile sleeve. Ultrasound is utilized secondary to lack of appropriate landmarks and to avoid vascular injury. Under direct ultrasound visualization, access is obtained to the right common femoral artery. Common femoral artery is identified. It is echolucent and pulsatile, indicating patency. Image is recorded for the permanent record. Puncture is made under real-time visualization. Microwire followed micro sheath, J-wire followed by a 5-French  sheath and 5-French pigtail catheter. The pigtail catheter is positioned at the level of T12 and AP projection of the aorta is obtained.   Pigtail catheter is repositioned to above the bifurcation and an RAO imaging of the pelvis is obtained. After review of the images, an Advantage wire with the pigtail catheter was used to cross the bifurcation and then the catheter is advanced down into the common femoral and then subsequently repositioned into the superficial femoral where LAO projection of the groin is obtained and then AP distal runoff. After review of the images, the SFA and popliteal appear to be patent. There is diffuse disease but I do not discern a hemodynamically-significant stenosis. Therefore, a straight slip catheter is advanced down to the distal popliteal and distal runoff is obtained. Review of the image demonstrates anterior tibial and posterior tibial are occluded throughout their course. The peroneal reconstitutes in its mid to distal 1/3 after a long-segment occlusion beginning at the level of the tibioperoneal trunk. Heparin 3000 units is given. The Advantage wire is reintroduced. A 6-French sheath is advanced up and over the bifurcation, positioned with its tip in the SFA and subsequently attempts are made at crossing the lesion. This is performed with the Advantage wire and the straight slip catheter. Ultimately, the wire is advanced down to what appears to be the proximal peroneal based on calcifications seen under fluoroscopy. The wire advances slightly farther when an 0.14 wire is used and, therefore, a 2 x 10 balloon is advanced over the wire and used to angioplasty the tibioperoneal trunk and proximal peroneal. This then does allow the slip catheter to track where it would not move any farther forward and it is brought down to the tip where the wire is. Wire is then advanced  with the catheter; however, on repeated imaging both from the sheath in the groin as well as directly through  the catheter, re-entry into the true lumen in the distal peroneal was not achieved. After multiple attempts, it is decided to complete distal runoff, which is then obtained down to the ankle. The peroneal is the only runoff but is adequate as a target for distal bypass.   The sheath is then pulled into the right external iliac, oblique view is performed and subsequently a Minx device deployed without difficulty. Light pressure is held and there are no immediate complications.   INTERPRETATION: The aorta and bilateral iliac systems are widely patent although there is diffuse disease. There are no hemodynamically significant stenoses.   The left common femoral and profunda femoris are patent but diffusely diseased. Again, no hemodynamically significant stenoses are noted.   The left superficial femoral and popliteal are diffusely diseased. There are several areas of stenosis which appear to be 40% to 50% but there does not appear to be any critical lesions throughout the superficial femoral artery or popliteal. Below this level there is diffuse disease and occlusions of all the tibials. The peroneal is the only vessel runoff to the foot after it reconstitutes in its mid to distal 1/3.   Attempts at crossing this are unsuccessful as re-entry into the true lumen of the peroneal was not achieved.   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 The patient will be vein mapped to ascertain whether an adequate conduit is available for distal bypass, also for consideration will be to allow the area to heal and then return with an attempt at using the Crosser atherectomy, S6 device.     ____________________________ Renford Dills, MD ggs:cs D: 07/28/2012 17:49:00 ET T: 07/28/2012 19:53:55 ET JOB#: [id number]  cc: Renford Dills, MD, <Dictator> Renford Dills MD ELECTRONICALLY SIGNED 08/03/2012 9:28

## 2014-07-10 DIAGNOSIS — Z961 Presence of intraocular lens: Secondary | ICD-10-CM | POA: Diagnosis not present

## 2014-07-10 DIAGNOSIS — E119 Type 2 diabetes mellitus without complications: Secondary | ICD-10-CM | POA: Diagnosis not present

## 2014-07-11 NOTE — Addendum Note (Signed)
Addended by: Sharee Pimple on: 07/11/2014 02:54 PM   Modules accepted: Orders

## 2014-07-13 DIAGNOSIS — F0390 Unspecified dementia without behavioral disturbance: Secondary | ICD-10-CM | POA: Diagnosis not present

## 2014-07-13 DIAGNOSIS — F419 Anxiety disorder, unspecified: Secondary | ICD-10-CM | POA: Diagnosis not present

## 2014-07-13 DIAGNOSIS — F329 Major depressive disorder, single episode, unspecified: Secondary | ICD-10-CM | POA: Diagnosis not present

## 2014-07-13 DIAGNOSIS — F29 Unspecified psychosis not due to a substance or known physiological condition: Secondary | ICD-10-CM | POA: Diagnosis not present

## 2014-07-25 ENCOUNTER — Encounter: Payer: Self-pay | Admitting: Vascular Surgery

## 2014-07-26 ENCOUNTER — Ambulatory Visit (INDEPENDENT_AMBULATORY_CARE_PROVIDER_SITE_OTHER): Payer: Self-pay | Admitting: Vascular Surgery

## 2014-07-26 ENCOUNTER — Ambulatory Visit (HOSPITAL_COMMUNITY)
Admission: RE | Admit: 2014-07-26 | Discharge: 2014-07-26 | Disposition: A | Discharge status: Home / Self Care | Payer: No Typology Code available for payment source | Source: Ambulatory Visit | Attending: Vascular Surgery | Admitting: Vascular Surgery

## 2014-07-26 ENCOUNTER — Encounter: Payer: Self-pay | Admitting: Vascular Surgery

## 2014-07-26 VITALS — BP 163/73 | HR 60 | Ht 73.0 in | Wt 180.0 lb

## 2014-07-26 DIAGNOSIS — E119 Type 2 diabetes mellitus without complications: Secondary | ICD-10-CM | POA: Diagnosis not present

## 2014-07-26 DIAGNOSIS — I10 Essential (primary) hypertension: Secondary | ICD-10-CM | POA: Diagnosis not present

## 2014-07-26 DIAGNOSIS — Z48812 Encounter for surgical aftercare following surgery on the circulatory system: Secondary | ICD-10-CM | POA: Diagnosis not present

## 2014-07-26 DIAGNOSIS — Z87891 Personal history of nicotine dependence: Secondary | ICD-10-CM | POA: Diagnosis not present

## 2014-07-26 DIAGNOSIS — I739 Peripheral vascular disease, unspecified: Secondary | ICD-10-CM

## 2014-07-26 DIAGNOSIS — I70209 Unspecified atherosclerosis of native arteries of extremities, unspecified extremity: Secondary | ICD-10-CM

## 2014-07-26 NOTE — Progress Notes (Signed)
Patient name: Terry Green MRN: [id number] DOB: April 27, 1940 Sex: male  REASON FOR VISIT: follow up  HPI: Terry Green is a 74 y.o. male good presented with gangrene of the right foot. 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 When I saw him last on 07/05/2014 he had a blister on his left heel from shoe. At that time the right below the knee amputation site was healing nicely and removed his staples in the office.  The wound on the heel was fairly superficial and the tissue appeared reasonably well perfused. He comes in for follow up visit.  He has no specific complaints and is being fitted for a prosthesis. At the nursing facility they have been keeping pressure off of his left heel.  Current Outpatient Prescriptions  Medication Sig Dispense Refill  . amLODipine (NORVASC) 10 MG tablet Take 10 mg by mouth at bedtime.     Marland Kitchen aspirin 81 MG chewable tablet Chew 81 mg by mouth daily.    . busPIRone (BUSPAR) 15 MG tablet Take 15 mg by mouth at bedtime.    . carvedilol (COREG) 12.5 MG tablet Take 12.5 mg by mouth 2 (two) times daily with a meal.    . clopidogrel (PLAVIX) 75 MG tablet Take 75 mg by mouth daily.    . divalproex (DEPAKOTE ER) 250 MG 24 hr tablet Take 250 mg by mouth 2 (two) times daily.    Marland Kitchen HYDROcodone-acetaminophen (NORCO) 7.5-325 MG per tablet Take 1 tablet by mouth every 4 (four) hours as needed for moderate pain. 30 tablet 0  . insulin aspart (NOVOLOG) 100 UNIT/ML FlexPen Inject 0-20 Units into the skin 2 (two) times daily. 6:30am and 4pm per sliding scale:  CBG 150-249 3 units, 250-349 5 units, 350-449 8 units, 450-549 14 units, 550-551 20 units >551 call MD    . Insulin Glargine (LANTUS) 100 UNIT/ML Solostar Pen Inject 10 Units into the skin at bedtime.     . memantine (NAMENDA) 10 MG tablet Take 10 mg by mouth 2 (two) times daily.    . metFORMIN (GLUCOPHAGE) 1000 MG tablet Take 1,000 mg by mouth daily.    Marland Kitchen oxybutynin (DITROPAN-XL) 10 MG 24 hr tablet Take  10 mg by mouth at bedtime.    . risperiDONE (RISPERDAL) 0.25 MG tablet Take 0.25 mg by mouth at bedtime.    . sertraline (ZOLOFT) 100 MG tablet Take 100 mg by mouth daily.    . tamsulosin (FLOMAX) 0.4 MG CAPS capsule Take 0.4 mg by mouth at bedtime.      No current facility-administered medications for this visit.   REVIEW OF SYSTEMS: Arly.Keller ] denotes positive finding; [  ] denotes negative finding  CARDIOVASCULAR:  [ ]  chest pain   [ ]  dyspnea on exertion    CONSTITUTIONAL:  [ ]  fever   [ ]  chills  PHYSICAL EXAM: Filed Vitals:   07/26/14 0953  BP: 163/73  Pulse: 60  Height: 6\' 1"  (1.854 m)  Weight: 180 lb (81.647 kg)  SpO2: 97%   GENERAL: The patient is a well-nourished male, in no acute distress. The vital signs are documented above. CARDIOVASCULAR: There is a regular rate and rhythm. PULMONARY: There is good air exchange bilaterally without wheezing or rales. His right BKA is healing nicely without erythema or drainage. The wound on the left heel has essentially healed. His skin is very dry however.  I have independently interpreted his Doppler study which shows monophasic Doppler signals in the left  foot with an ABI of 65%. The pressure on the left is 72 mmHg.  MEDICAL ISSUES: Patient is doing well status post right below the knee amputation. He is being fitted for a prosthesis. He is not a smoker. I have encouraged him to stay as active as possible. I will see him back in 3 months to keep a close eye on the wound on his left heel. 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 However, based on his Doppler study with a toe pressure of 72 mmHg he should have adequate circulation at this point.  [name] [facility] Beeper: [phone]

## 2014-08-07 DIAGNOSIS — I10 Essential (primary) hypertension: Secondary | ICD-10-CM | POA: Diagnosis not present

## 2014-08-07 DIAGNOSIS — I739 Peripheral vascular disease, unspecified: Secondary | ICD-10-CM | POA: Diagnosis not present

## 2014-08-07 DIAGNOSIS — F0390 Unspecified dementia without behavioral disturbance: Secondary | ICD-10-CM | POA: Diagnosis not present

## 2014-08-07 DIAGNOSIS — E119 Type 2 diabetes mellitus without complications: Secondary | ICD-10-CM | POA: Diagnosis not present

## 2014-08-10 DIAGNOSIS — I739 Peripheral vascular disease, unspecified: Secondary | ICD-10-CM | POA: Diagnosis not present

## 2014-08-10 DIAGNOSIS — R262 Difficulty in walking, not elsewhere classified: Secondary | ICD-10-CM | POA: Diagnosis not present

## 2014-08-11 DIAGNOSIS — I739 Peripheral vascular disease, unspecified: Secondary | ICD-10-CM | POA: Diagnosis not present

## 2014-08-11 DIAGNOSIS — R262 Difficulty in walking, not elsewhere classified: Secondary | ICD-10-CM | POA: Diagnosis not present

## 2014-08-12 DIAGNOSIS — I739 Peripheral vascular disease, unspecified: Secondary | ICD-10-CM | POA: Diagnosis not present

## 2014-08-12 DIAGNOSIS — R262 Difficulty in walking, not elsewhere classified: Secondary | ICD-10-CM | POA: Diagnosis not present

## 2014-08-14 DIAGNOSIS — I739 Peripheral vascular disease, unspecified: Secondary | ICD-10-CM | POA: Diagnosis not present

## 2014-08-14 DIAGNOSIS — R262 Difficulty in walking, not elsewhere classified: Secondary | ICD-10-CM | POA: Diagnosis not present

## 2014-08-15 DIAGNOSIS — R262 Difficulty in walking, not elsewhere classified: Secondary | ICD-10-CM | POA: Diagnosis not present

## 2014-08-15 DIAGNOSIS — I739 Peripheral vascular disease, unspecified: Secondary | ICD-10-CM | POA: Diagnosis not present

## 2014-08-16 DIAGNOSIS — I739 Peripheral vascular disease, unspecified: Secondary | ICD-10-CM | POA: Diagnosis not present

## 2014-08-16 DIAGNOSIS — R262 Difficulty in walking, not elsewhere classified: Secondary | ICD-10-CM | POA: Diagnosis not present

## 2014-08-16 DIAGNOSIS — M6281 Muscle weakness (generalized): Secondary | ICD-10-CM | POA: Diagnosis not present

## 2014-08-17 DIAGNOSIS — I739 Peripheral vascular disease, unspecified: Secondary | ICD-10-CM | POA: Diagnosis not present

## 2014-08-17 DIAGNOSIS — M6281 Muscle weakness (generalized): Secondary | ICD-10-CM | POA: Diagnosis not present

## 2014-08-17 DIAGNOSIS — R262 Difficulty in walking, not elsewhere classified: Secondary | ICD-10-CM | POA: Diagnosis not present

## 2014-08-18 DIAGNOSIS — I739 Peripheral vascular disease, unspecified: Secondary | ICD-10-CM | POA: Diagnosis not present

## 2014-08-18 DIAGNOSIS — M6281 Muscle weakness (generalized): Secondary | ICD-10-CM | POA: Diagnosis not present

## 2014-08-18 DIAGNOSIS — R262 Difficulty in walking, not elsewhere classified: Secondary | ICD-10-CM | POA: Diagnosis not present

## 2014-08-19 DIAGNOSIS — R262 Difficulty in walking, not elsewhere classified: Secondary | ICD-10-CM | POA: Diagnosis not present

## 2014-08-19 DIAGNOSIS — I739 Peripheral vascular disease, unspecified: Secondary | ICD-10-CM | POA: Diagnosis not present

## 2014-08-19 DIAGNOSIS — M6281 Muscle weakness (generalized): Secondary | ICD-10-CM | POA: Diagnosis not present

## 2014-08-21 DIAGNOSIS — R262 Difficulty in walking, not elsewhere classified: Secondary | ICD-10-CM | POA: Diagnosis not present

## 2014-08-21 DIAGNOSIS — I739 Peripheral vascular disease, unspecified: Secondary | ICD-10-CM | POA: Diagnosis not present

## 2014-08-21 DIAGNOSIS — M6281 Muscle weakness (generalized): Secondary | ICD-10-CM | POA: Diagnosis not present

## 2014-08-22 DIAGNOSIS — M6281 Muscle weakness (generalized): Secondary | ICD-10-CM | POA: Diagnosis not present

## 2014-08-22 DIAGNOSIS — I739 Peripheral vascular disease, unspecified: Secondary | ICD-10-CM | POA: Diagnosis not present

## 2014-08-22 DIAGNOSIS — R262 Difficulty in walking, not elsewhere classified: Secondary | ICD-10-CM | POA: Diagnosis not present

## 2014-08-23 DIAGNOSIS — M6281 Muscle weakness (generalized): Secondary | ICD-10-CM | POA: Diagnosis not present

## 2014-08-23 DIAGNOSIS — I739 Peripheral vascular disease, unspecified: Secondary | ICD-10-CM | POA: Diagnosis not present

## 2014-08-23 DIAGNOSIS — R262 Difficulty in walking, not elsewhere classified: Secondary | ICD-10-CM | POA: Diagnosis not present

## 2014-08-24 DIAGNOSIS — I739 Peripheral vascular disease, unspecified: Secondary | ICD-10-CM | POA: Diagnosis not present

## 2014-08-24 DIAGNOSIS — M6281 Muscle weakness (generalized): Secondary | ICD-10-CM | POA: Diagnosis not present

## 2014-08-24 DIAGNOSIS — R262 Difficulty in walking, not elsewhere classified: Secondary | ICD-10-CM | POA: Diagnosis not present

## 2014-08-25 DIAGNOSIS — M6281 Muscle weakness (generalized): Secondary | ICD-10-CM | POA: Diagnosis not present

## 2014-08-25 DIAGNOSIS — R262 Difficulty in walking, not elsewhere classified: Secondary | ICD-10-CM | POA: Diagnosis not present

## 2014-08-25 DIAGNOSIS — I739 Peripheral vascular disease, unspecified: Secondary | ICD-10-CM | POA: Diagnosis not present

## 2014-08-28 DIAGNOSIS — R262 Difficulty in walking, not elsewhere classified: Secondary | ICD-10-CM | POA: Diagnosis not present

## 2014-08-28 DIAGNOSIS — M6281 Muscle weakness (generalized): Secondary | ICD-10-CM | POA: Diagnosis not present

## 2014-08-28 DIAGNOSIS — I739 Peripheral vascular disease, unspecified: Secondary | ICD-10-CM | POA: Diagnosis not present

## 2014-08-29 DIAGNOSIS — M6281 Muscle weakness (generalized): Secondary | ICD-10-CM | POA: Diagnosis not present

## 2014-08-29 DIAGNOSIS — R262 Difficulty in walking, not elsewhere classified: Secondary | ICD-10-CM | POA: Diagnosis not present

## 2014-08-29 DIAGNOSIS — I739 Peripheral vascular disease, unspecified: Secondary | ICD-10-CM | POA: Diagnosis not present

## 2014-08-30 DIAGNOSIS — R262 Difficulty in walking, not elsewhere classified: Secondary | ICD-10-CM | POA: Diagnosis not present

## 2014-08-30 DIAGNOSIS — M6281 Muscle weakness (generalized): Secondary | ICD-10-CM | POA: Diagnosis not present

## 2014-08-30 DIAGNOSIS — I739 Peripheral vascular disease, unspecified: Secondary | ICD-10-CM | POA: Diagnosis not present

## 2014-08-31 ENCOUNTER — Ambulatory Visit: Payer: Medicare Other | Admitting: Vascular Surgery

## 2014-08-31 DIAGNOSIS — R262 Difficulty in walking, not elsewhere classified: Secondary | ICD-10-CM | POA: Diagnosis not present

## 2014-08-31 DIAGNOSIS — I739 Peripheral vascular disease, unspecified: Secondary | ICD-10-CM | POA: Diagnosis not present

## 2014-08-31 DIAGNOSIS — M6281 Muscle weakness (generalized): Secondary | ICD-10-CM | POA: Diagnosis not present

## 2014-09-01 ENCOUNTER — Encounter: Payer: Self-pay | Admitting: Vascular Surgery

## 2014-09-01 DIAGNOSIS — R262 Difficulty in walking, not elsewhere classified: Secondary | ICD-10-CM | POA: Diagnosis not present

## 2014-09-01 DIAGNOSIS — M6281 Muscle weakness (generalized): Secondary | ICD-10-CM | POA: Diagnosis not present

## 2014-09-01 DIAGNOSIS — I739 Peripheral vascular disease, unspecified: Secondary | ICD-10-CM | POA: Diagnosis not present

## 2014-09-04 DIAGNOSIS — I739 Peripheral vascular disease, unspecified: Secondary | ICD-10-CM | POA: Diagnosis not present

## 2014-09-04 DIAGNOSIS — R262 Difficulty in walking, not elsewhere classified: Secondary | ICD-10-CM | POA: Diagnosis not present

## 2014-09-04 DIAGNOSIS — M6281 Muscle weakness (generalized): Secondary | ICD-10-CM | POA: Diagnosis not present

## 2014-09-05 DIAGNOSIS — M6281 Muscle weakness (generalized): Secondary | ICD-10-CM | POA: Diagnosis not present

## 2014-09-05 DIAGNOSIS — I739 Peripheral vascular disease, unspecified: Secondary | ICD-10-CM | POA: Diagnosis not present

## 2014-09-05 DIAGNOSIS — R262 Difficulty in walking, not elsewhere classified: Secondary | ICD-10-CM | POA: Diagnosis not present

## 2014-09-06 ENCOUNTER — Encounter: Payer: Self-pay | Admitting: Vascular Surgery

## 2014-09-06 ENCOUNTER — Ambulatory Visit (INDEPENDENT_AMBULATORY_CARE_PROVIDER_SITE_OTHER): Payer: Medicare Other | Admitting: Vascular Surgery

## 2014-09-06 VITALS — BP 129/76 | HR 71 | Resp 16 | Ht 73.0 in | Wt 170.0 lb

## 2014-09-06 DIAGNOSIS — S81802A Unspecified open wound, left lower leg, initial encounter: Secondary | ICD-10-CM | POA: Diagnosis not present

## 2014-09-06 DIAGNOSIS — R262 Difficulty in walking, not elsewhere classified: Secondary | ICD-10-CM | POA: Diagnosis not present

## 2014-09-06 DIAGNOSIS — M6281 Muscle weakness (generalized): Secondary | ICD-10-CM | POA: Diagnosis not present

## 2014-09-06 DIAGNOSIS — I739 Peripheral vascular disease, unspecified: Secondary | ICD-10-CM | POA: Diagnosis not present

## 2014-09-06 DIAGNOSIS — I70209 Unspecified atherosclerosis of native arteries of extremities, unspecified extremity: Secondary | ICD-10-CM | POA: Diagnosis not present

## 2014-09-06 NOTE — Progress Notes (Signed)
[facility] [place] Office Progress Note  CC:  New left foot wound Hasanaj, Terry Gianotti, MD  HPI: This is a 74 y.o. male who is seen as an add-on today for evaluation of a new left medial ankle ulcer and worsening of his left heel wound. His medial ankle wound started about one week ago and his left heel wound also worsened one week ago when the patient pulled off a scab. He is a resident at a nursing facility and has been receiving wet to dry dressings to these areas. He does note some tenderness to the areas. He denies any fever or chills.  Per his caregiver, the patient constantly places pressure on his left heel when he sits. While in bed, he has on weight offloading boot on. He was last seen in the [facility] office on 07/26/14 where it was noted that he had a blister on his left heel secondary to shoe wear. It was nearly healed.   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 He is currently ambulating with a prosthesis and  is able to ambulate about 3-400 feet. He does use a walker intermittently.   Past Medical History  Diagnosis Date  . Hypertension   . Hypercholesteremia   . Peripheral vascular disease   . Bipolar disorder   . Type II diabetes mellitus   . Arthritis     "hands" (06/06/2014)  . Rheumatoid arthritis   . PAD (peripheral artery disease)    Past Surgical History  Procedure Laterality Date  . Transurethral resection of prostate  2012  . Knee arthroscopy Right 1970    [place]  . Below knee leg amputation Right 06/06/2014  . Balloon angioplasty, artery Right     attempted balloon angioplasty and stenting of the anterior tibial artery that apparently was unsuccessful. [name] 05/26/2014  . Amputation Right 06/06/2014    Procedure: AMPUTATION BELOW KNEE;  Surgeon: Chuck Hint, MD;  Location: [facility] OR;  Service: Vascular;  Laterality: Right;    Allergies  Allergen Reactions  . Lisinopril Swelling    Current Outpatient Prescriptions  Medication  Sig Dispense Refill  . amLODipine (NORVASC) 10 MG tablet Take 10 mg by mouth at bedtime.     Marland Kitchen aspirin 81 MG chewable tablet Chew 81 mg by mouth daily.    . busPIRone (BUSPAR) 15 MG tablet Take 15 mg by mouth at bedtime.    . carvedilol (COREG) 12.5 MG tablet Take 12.5 mg by mouth 2 (two) times daily with a meal.    . clopidogrel (PLAVIX) 75 MG tablet Take 75 mg by mouth daily.    . divalproex (DEPAKOTE ER) 250 MG 24 hr tablet Take 250 mg by mouth 2 (two) times daily.    Marland Kitchen gabapentin (NEURONTIN) 300 MG capsule Take 300 mg by mouth 3 (three) times daily.    Marland Kitchen HYDROcodone-acetaminophen (NORCO) 7.5-325 MG per tablet Take 1 tablet by mouth every 4 (four) hours as needed for moderate pain. 30 tablet 0  . insulin aspart (NOVOLOG) 100 UNIT/ML FlexPen Inject 0-20 Units into the skin 2 (two) times daily. 6:30am and 4pm per sliding scale:  CBG 150-249 3 units, 250-349 5 units, 350-449 8 units, 450-549 14 units, 550-551 20 units >551 call MD    . Insulin Glargine (LANTUS) 100 UNIT/ML Solostar Pen Inject 10 Units into the skin at bedtime.     Marland Kitchen loratadine (CLARITIN) 10 MG tablet Take 10 mg by mouth daily.    . memantine (NAMENDA) 10  MG tablet Take 10 mg by mouth 2 (two) times daily.    . metFORMIN (GLUCOPHAGE) 1000 MG tablet Take 1,000 mg by mouth daily.    Marland Kitchen oxybutynin (DITROPAN-XL) 10 MG 24 hr tablet Take 10 mg by mouth at bedtime.    . risperiDONE (RISPERDAL) 0.25 MG tablet Take 0.25 mg by mouth at bedtime.    . sertraline (ZOLOFT) 100 MG tablet Take 100 mg by mouth daily.    . tamsulosin (FLOMAX) 0.4 MG CAPS capsule Take 0.4 mg by mouth at bedtime.      No current facility-administered medications for this visit.    Family History  Problem Relation Age of Onset  . Heart disease Father     History   Social History  . Marital Status: Widowed    Spouse Name: N/A  . Number of Children: N/A  . Years of Education: N/A   Occupational History  . Not on file.   Social History Main Topics  .  Smoking status: Former Smoker -- 1.00 packs/day for 54 years    Types: Cigarettes    Quit date: 05/25/2012  . Smokeless tobacco: Never Used  . Alcohol Use: 0.0 oz/week    0 Standard drinks or equivalent per week     Comment: 06/06/2014 "I drank 1/2 pint whiskey each day;  nothing in 20 yrs"  . Drug Use: No  . Sexual Activity: Yes    Birth Control/ Protection: None   Other Topics Concern  . Not on file   Social History Narrative    PHYSICAL EXAMINATION:  Filed Vitals:   09/06/14 1332  BP: 129/76  Pulse: 71  Resp: 16   Body mass index is 22.43 kg/(m^2).  General:  WDWN in NAD, sitting in wheelchair HENT: WNL, normocephalic Pulmonary: normal non-labored breathing Vascular Exam/Pulses: Strong biphasic doppler flow to left dorsalis pedis and peroneal. Monophasic doppler flow to left posterior tibial.  Extremities: 1 x 1 cm superficial ulcer to left medial ankle. The wound is red with good granulation tissue, no purulence. 2 x 2 left heel wound. It appears nearly healed. There is a 1 x 1 cm area of dry eschar. No drainage or fluctuance of either wound. No cellulitis.  Musculoskeletal: right BKA with prosthesis on. 1+ left pedal edema.  Neurologic: A&O X 3; Appropriate Affect ; SENSATION: normal; MOTOR FUNCTION:  moving all extremities equally. Speech is fluent/normal   ASSESSMENT: 74 y.o. male with superficial ulcer of left medial malleolus and nearly healed left heel wound.   PLAN: His previous ABIs back on 07/26/14 revealed a left ABI of 65% and toe pressure of 72 mmHg. He should have adequate circulation to heal his left heel and left medial ankle wounds. Advised use of daily lukewarm dial soap soaks and wet to dry saline dressings to the wounds twice daily. Also advised the patient to continue to keep pressure off his heel when sitting and in bed. He will follow up in 4 weeks for wound evaluation. If his wounds do not improve, he knows to contact the office sooner.  His left BKA  is well healed and he is currently using a prosthesis.    Maris Berger, PA-C [facility] [phone]  Clinic MD:  Pt seen and examined in conjunction with Dr. Edilia Bo

## 2014-09-07 DIAGNOSIS — R262 Difficulty in walking, not elsewhere classified: Secondary | ICD-10-CM | POA: Diagnosis not present

## 2014-09-07 DIAGNOSIS — M6281 Muscle weakness (generalized): Secondary | ICD-10-CM | POA: Diagnosis not present

## 2014-09-07 DIAGNOSIS — I739 Peripheral vascular disease, unspecified: Secondary | ICD-10-CM | POA: Diagnosis not present

## 2014-09-08 ENCOUNTER — Encounter: Payer: Self-pay | Admitting: Internal Medicine

## 2014-09-08 DIAGNOSIS — I739 Peripheral vascular disease, unspecified: Secondary | ICD-10-CM | POA: Diagnosis not present

## 2014-09-08 DIAGNOSIS — R262 Difficulty in walking, not elsewhere classified: Secondary | ICD-10-CM | POA: Diagnosis not present

## 2014-09-08 DIAGNOSIS — M6281 Muscle weakness (generalized): Secondary | ICD-10-CM | POA: Diagnosis not present

## 2014-09-11 DIAGNOSIS — I739 Peripheral vascular disease, unspecified: Secondary | ICD-10-CM | POA: Diagnosis not present

## 2014-09-11 DIAGNOSIS — M6281 Muscle weakness (generalized): Secondary | ICD-10-CM | POA: Diagnosis not present

## 2014-09-11 DIAGNOSIS — R262 Difficulty in walking, not elsewhere classified: Secondary | ICD-10-CM | POA: Diagnosis not present

## 2014-09-12 DIAGNOSIS — M6281 Muscle weakness (generalized): Secondary | ICD-10-CM | POA: Diagnosis not present

## 2014-09-12 DIAGNOSIS — R262 Difficulty in walking, not elsewhere classified: Secondary | ICD-10-CM | POA: Diagnosis not present

## 2014-09-12 DIAGNOSIS — I739 Peripheral vascular disease, unspecified: Secondary | ICD-10-CM | POA: Diagnosis not present

## 2014-09-13 DIAGNOSIS — R262 Difficulty in walking, not elsewhere classified: Secondary | ICD-10-CM | POA: Diagnosis not present

## 2014-09-13 DIAGNOSIS — I739 Peripheral vascular disease, unspecified: Secondary | ICD-10-CM | POA: Diagnosis not present

## 2014-09-13 DIAGNOSIS — M6281 Muscle weakness (generalized): Secondary | ICD-10-CM | POA: Diagnosis not present

## 2014-09-14 DIAGNOSIS — M6281 Muscle weakness (generalized): Secondary | ICD-10-CM | POA: Diagnosis not present

## 2014-09-14 DIAGNOSIS — R262 Difficulty in walking, not elsewhere classified: Secondary | ICD-10-CM | POA: Diagnosis not present

## 2014-09-14 DIAGNOSIS — I739 Peripheral vascular disease, unspecified: Secondary | ICD-10-CM | POA: Diagnosis not present

## 2014-09-15 DIAGNOSIS — I739 Peripheral vascular disease, unspecified: Secondary | ICD-10-CM | POA: Diagnosis not present

## 2014-09-15 DIAGNOSIS — M6281 Muscle weakness (generalized): Secondary | ICD-10-CM | POA: Diagnosis not present

## 2014-09-15 DIAGNOSIS — M24561 Contracture, right knee: Secondary | ICD-10-CM | POA: Diagnosis not present

## 2014-09-15 DIAGNOSIS — F441 Dissociative fugue: Secondary | ICD-10-CM | POA: Diagnosis not present

## 2014-09-15 DIAGNOSIS — I10 Essential (primary) hypertension: Secondary | ICD-10-CM | POA: Diagnosis not present

## 2014-09-15 DIAGNOSIS — R279 Unspecified lack of coordination: Secondary | ICD-10-CM | POA: Diagnosis not present

## 2014-09-15 DIAGNOSIS — R262 Difficulty in walking, not elsewhere classified: Secondary | ICD-10-CM | POA: Diagnosis not present

## 2014-09-15 DIAGNOSIS — N400 Enlarged prostate without lower urinary tract symptoms: Secondary | ICD-10-CM | POA: Diagnosis not present

## 2014-09-18 DIAGNOSIS — I10 Essential (primary) hypertension: Secondary | ICD-10-CM | POA: Diagnosis not present

## 2014-09-18 DIAGNOSIS — R279 Unspecified lack of coordination: Secondary | ICD-10-CM | POA: Diagnosis not present

## 2014-09-18 DIAGNOSIS — F441 Dissociative fugue: Secondary | ICD-10-CM | POA: Diagnosis not present

## 2014-09-18 DIAGNOSIS — M6281 Muscle weakness (generalized): Secondary | ICD-10-CM | POA: Diagnosis not present

## 2014-09-18 DIAGNOSIS — R262 Difficulty in walking, not elsewhere classified: Secondary | ICD-10-CM | POA: Diagnosis not present

## 2014-09-18 DIAGNOSIS — M24561 Contracture, right knee: Secondary | ICD-10-CM | POA: Diagnosis not present

## 2014-09-19 DIAGNOSIS — R279 Unspecified lack of coordination: Secondary | ICD-10-CM | POA: Diagnosis not present

## 2014-09-19 DIAGNOSIS — R262 Difficulty in walking, not elsewhere classified: Secondary | ICD-10-CM | POA: Diagnosis not present

## 2014-09-19 DIAGNOSIS — F441 Dissociative fugue: Secondary | ICD-10-CM | POA: Diagnosis not present

## 2014-09-19 DIAGNOSIS — M24561 Contracture, right knee: Secondary | ICD-10-CM | POA: Diagnosis not present

## 2014-09-19 DIAGNOSIS — I10 Essential (primary) hypertension: Secondary | ICD-10-CM | POA: Diagnosis not present

## 2014-09-19 DIAGNOSIS — M6281 Muscle weakness (generalized): Secondary | ICD-10-CM | POA: Diagnosis not present

## 2014-09-20 DIAGNOSIS — M24561 Contracture, right knee: Secondary | ICD-10-CM | POA: Diagnosis not present

## 2014-09-20 DIAGNOSIS — M6281 Muscle weakness (generalized): Secondary | ICD-10-CM | POA: Diagnosis not present

## 2014-09-20 DIAGNOSIS — I10 Essential (primary) hypertension: Secondary | ICD-10-CM | POA: Diagnosis not present

## 2014-09-20 DIAGNOSIS — R262 Difficulty in walking, not elsewhere classified: Secondary | ICD-10-CM | POA: Diagnosis not present

## 2014-09-20 DIAGNOSIS — R279 Unspecified lack of coordination: Secondary | ICD-10-CM | POA: Diagnosis not present

## 2014-09-20 DIAGNOSIS — F441 Dissociative fugue: Secondary | ICD-10-CM | POA: Diagnosis not present

## 2014-09-21 DIAGNOSIS — F441 Dissociative fugue: Secondary | ICD-10-CM | POA: Diagnosis not present

## 2014-09-21 DIAGNOSIS — R279 Unspecified lack of coordination: Secondary | ICD-10-CM | POA: Diagnosis not present

## 2014-09-21 DIAGNOSIS — R262 Difficulty in walking, not elsewhere classified: Secondary | ICD-10-CM | POA: Diagnosis not present

## 2014-09-21 DIAGNOSIS — M24561 Contracture, right knee: Secondary | ICD-10-CM | POA: Diagnosis not present

## 2014-09-21 DIAGNOSIS — I10 Essential (primary) hypertension: Secondary | ICD-10-CM | POA: Diagnosis not present

## 2014-09-21 DIAGNOSIS — M6281 Muscle weakness (generalized): Secondary | ICD-10-CM | POA: Diagnosis not present

## 2014-09-22 DIAGNOSIS — R279 Unspecified lack of coordination: Secondary | ICD-10-CM | POA: Diagnosis not present

## 2014-09-22 DIAGNOSIS — R262 Difficulty in walking, not elsewhere classified: Secondary | ICD-10-CM | POA: Diagnosis not present

## 2014-09-22 DIAGNOSIS — M24561 Contracture, right knee: Secondary | ICD-10-CM | POA: Diagnosis not present

## 2014-09-22 DIAGNOSIS — I10 Essential (primary) hypertension: Secondary | ICD-10-CM | POA: Diagnosis not present

## 2014-09-22 DIAGNOSIS — F441 Dissociative fugue: Secondary | ICD-10-CM | POA: Diagnosis not present

## 2014-09-22 DIAGNOSIS — M6281 Muscle weakness (generalized): Secondary | ICD-10-CM | POA: Diagnosis not present

## 2014-09-25 DIAGNOSIS — F441 Dissociative fugue: Secondary | ICD-10-CM | POA: Diagnosis not present

## 2014-09-25 DIAGNOSIS — I10 Essential (primary) hypertension: Secondary | ICD-10-CM | POA: Diagnosis not present

## 2014-09-25 DIAGNOSIS — R262 Difficulty in walking, not elsewhere classified: Secondary | ICD-10-CM | POA: Diagnosis not present

## 2014-09-25 DIAGNOSIS — R279 Unspecified lack of coordination: Secondary | ICD-10-CM | POA: Diagnosis not present

## 2014-09-25 DIAGNOSIS — M24561 Contracture, right knee: Secondary | ICD-10-CM | POA: Diagnosis not present

## 2014-09-25 DIAGNOSIS — M6281 Muscle weakness (generalized): Secondary | ICD-10-CM | POA: Diagnosis not present

## 2014-09-25 DIAGNOSIS — I739 Peripheral vascular disease, unspecified: Secondary | ICD-10-CM | POA: Diagnosis not present

## 2014-09-25 DIAGNOSIS — F0390 Unspecified dementia without behavioral disturbance: Secondary | ICD-10-CM | POA: Diagnosis not present

## 2014-09-25 DIAGNOSIS — E119 Type 2 diabetes mellitus without complications: Secondary | ICD-10-CM | POA: Diagnosis not present

## 2014-09-26 DIAGNOSIS — R279 Unspecified lack of coordination: Secondary | ICD-10-CM | POA: Diagnosis not present

## 2014-09-26 DIAGNOSIS — M24561 Contracture, right knee: Secondary | ICD-10-CM | POA: Diagnosis not present

## 2014-09-26 DIAGNOSIS — E1051 Type 1 diabetes mellitus with diabetic peripheral angiopathy without gangrene: Secondary | ICD-10-CM | POA: Diagnosis not present

## 2014-09-26 DIAGNOSIS — R262 Difficulty in walking, not elsewhere classified: Secondary | ICD-10-CM | POA: Diagnosis not present

## 2014-09-26 DIAGNOSIS — F441 Dissociative fugue: Secondary | ICD-10-CM | POA: Diagnosis not present

## 2014-09-26 DIAGNOSIS — M6281 Muscle weakness (generalized): Secondary | ICD-10-CM | POA: Diagnosis not present

## 2014-09-26 DIAGNOSIS — I10 Essential (primary) hypertension: Secondary | ICD-10-CM | POA: Diagnosis not present

## 2014-09-27 DIAGNOSIS — M24561 Contracture, right knee: Secondary | ICD-10-CM | POA: Diagnosis not present

## 2014-09-27 DIAGNOSIS — M6281 Muscle weakness (generalized): Secondary | ICD-10-CM | POA: Diagnosis not present

## 2014-09-27 DIAGNOSIS — I10 Essential (primary) hypertension: Secondary | ICD-10-CM | POA: Diagnosis not present

## 2014-09-27 DIAGNOSIS — F441 Dissociative fugue: Secondary | ICD-10-CM | POA: Diagnosis not present

## 2014-09-27 DIAGNOSIS — R262 Difficulty in walking, not elsewhere classified: Secondary | ICD-10-CM | POA: Diagnosis not present

## 2014-09-27 DIAGNOSIS — R279 Unspecified lack of coordination: Secondary | ICD-10-CM | POA: Diagnosis not present

## 2014-09-28 DIAGNOSIS — R262 Difficulty in walking, not elsewhere classified: Secondary | ICD-10-CM | POA: Diagnosis not present

## 2014-09-28 DIAGNOSIS — I10 Essential (primary) hypertension: Secondary | ICD-10-CM | POA: Diagnosis not present

## 2014-09-28 DIAGNOSIS — R279 Unspecified lack of coordination: Secondary | ICD-10-CM | POA: Diagnosis not present

## 2014-09-28 DIAGNOSIS — M24561 Contracture, right knee: Secondary | ICD-10-CM | POA: Diagnosis not present

## 2014-09-28 DIAGNOSIS — F441 Dissociative fugue: Secondary | ICD-10-CM | POA: Diagnosis not present

## 2014-09-28 DIAGNOSIS — M6281 Muscle weakness (generalized): Secondary | ICD-10-CM | POA: Diagnosis not present

## 2014-09-29 DIAGNOSIS — R262 Difficulty in walking, not elsewhere classified: Secondary | ICD-10-CM | POA: Diagnosis not present

## 2014-09-29 DIAGNOSIS — M6281 Muscle weakness (generalized): Secondary | ICD-10-CM | POA: Diagnosis not present

## 2014-09-29 DIAGNOSIS — M24561 Contracture, right knee: Secondary | ICD-10-CM | POA: Diagnosis not present

## 2014-09-29 DIAGNOSIS — F441 Dissociative fugue: Secondary | ICD-10-CM | POA: Diagnosis not present

## 2014-09-29 DIAGNOSIS — I10 Essential (primary) hypertension: Secondary | ICD-10-CM | POA: Diagnosis not present

## 2014-09-29 DIAGNOSIS — R279 Unspecified lack of coordination: Secondary | ICD-10-CM | POA: Diagnosis not present

## 2014-10-02 ENCOUNTER — Encounter: Payer: Self-pay | Admitting: Vascular Surgery

## 2014-10-02 DIAGNOSIS — M6281 Muscle weakness (generalized): Secondary | ICD-10-CM | POA: Diagnosis not present

## 2014-10-02 DIAGNOSIS — I10 Essential (primary) hypertension: Secondary | ICD-10-CM | POA: Diagnosis not present

## 2014-10-02 DIAGNOSIS — M24561 Contracture, right knee: Secondary | ICD-10-CM | POA: Diagnosis not present

## 2014-10-02 DIAGNOSIS — R262 Difficulty in walking, not elsewhere classified: Secondary | ICD-10-CM | POA: Diagnosis not present

## 2014-10-02 DIAGNOSIS — R279 Unspecified lack of coordination: Secondary | ICD-10-CM | POA: Diagnosis not present

## 2014-10-02 DIAGNOSIS — F441 Dissociative fugue: Secondary | ICD-10-CM | POA: Diagnosis not present

## 2014-10-03 DIAGNOSIS — R262 Difficulty in walking, not elsewhere classified: Secondary | ICD-10-CM | POA: Diagnosis not present

## 2014-10-03 DIAGNOSIS — M24561 Contracture, right knee: Secondary | ICD-10-CM | POA: Diagnosis not present

## 2014-10-03 DIAGNOSIS — F441 Dissociative fugue: Secondary | ICD-10-CM | POA: Diagnosis not present

## 2014-10-03 DIAGNOSIS — R279 Unspecified lack of coordination: Secondary | ICD-10-CM | POA: Diagnosis not present

## 2014-10-03 DIAGNOSIS — I10 Essential (primary) hypertension: Secondary | ICD-10-CM | POA: Diagnosis not present

## 2014-10-03 DIAGNOSIS — M6281 Muscle weakness (generalized): Secondary | ICD-10-CM | POA: Diagnosis not present

## 2014-10-04 ENCOUNTER — Ambulatory Visit (INDEPENDENT_AMBULATORY_CARE_PROVIDER_SITE_OTHER): Payer: Medicare Other | Admitting: Vascular Surgery

## 2014-10-04 ENCOUNTER — Encounter: Payer: Self-pay | Admitting: Vascular Surgery

## 2014-10-04 VITALS — BP 136/70 | HR 64 | Temp 98.3°F | Ht 73.0 in | Wt 170.0 lb

## 2014-10-04 DIAGNOSIS — I70209 Unspecified atherosclerosis of native arteries of extremities, unspecified extremity: Secondary | ICD-10-CM

## 2014-10-04 DIAGNOSIS — M24561 Contracture, right knee: Secondary | ICD-10-CM | POA: Diagnosis not present

## 2014-10-04 DIAGNOSIS — M6281 Muscle weakness (generalized): Secondary | ICD-10-CM | POA: Diagnosis not present

## 2014-10-04 DIAGNOSIS — I739 Peripheral vascular disease, unspecified: Secondary | ICD-10-CM | POA: Diagnosis not present

## 2014-10-04 DIAGNOSIS — I10 Essential (primary) hypertension: Secondary | ICD-10-CM | POA: Diagnosis not present

## 2014-10-04 DIAGNOSIS — F441 Dissociative fugue: Secondary | ICD-10-CM | POA: Diagnosis not present

## 2014-10-04 DIAGNOSIS — R262 Difficulty in walking, not elsewhere classified: Secondary | ICD-10-CM | POA: Diagnosis not present

## 2014-10-04 DIAGNOSIS — E119 Type 2 diabetes mellitus without complications: Secondary | ICD-10-CM | POA: Diagnosis not present

## 2014-10-04 DIAGNOSIS — R279 Unspecified lack of coordination: Secondary | ICD-10-CM | POA: Diagnosis not present

## 2014-10-04 NOTE — Progress Notes (Signed)
[facility] [place]  Patient name: Terry Green MRN: [id number] DOB: 02-05-1941 Sex: male  REASON FOR VISIT: Follow up of left heel wound.  HPI: Terry Green is a 74 y.o. male who I last saw on 07/26/2014. He was doing well at that time status post right below the knee amputation. He was fitted for prosthesis. He had a small wound on his left heel and he comes in for a 3 month follow up visit. 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  He comes in to have the left foot checked. He has had a small wound on the heel and also a small wound adjacent to the medial malleolus. He has his prosthesis. He resides at the [facility] in [place]. They have a wound care nurse there that does an excellent job with the dressing changes. He denies fever or chills.    Past Medical History  Diagnosis Date  . Hypertension   . Hypercholesteremia   . Peripheral vascular disease   . Bipolar disorder   . Type II diabetes mellitus   . Arthritis     "hands" (06/06/2014)  . Rheumatoid arthritis   . PAD (peripheral artery disease)    Family History  Problem Relation Age of Onset  . Heart disease Father    SOCIAL HISTORY: History  Substance Use Topics  . Smoking status: Former Smoker -- 1.00 packs/day for 54 years    Types: Cigarettes    Quit date: 05/25/2012  . Smokeless tobacco: Never Used  . Alcohol Use: 0.0 oz/week    0 Standard drinks or equivalent per week     Comment: 06/06/2014 "I drank 1/2 pint whiskey each day;  nothing in 20 yrs"   Allergies  Allergen Reactions  . Lisinopril Swelling   Current Outpatient Prescriptions  Medication Sig Dispense Refill  . amLODipine (NORVASC) 10 MG tablet Take 10 mg by mouth at bedtime.     Marland Kitchen aspirin 81 MG chewable tablet Chew 81 mg by mouth daily.    . busPIRone (BUSPAR) 15 MG tablet Take 15  mg by mouth at bedtime.    . carvedilol (COREG) 12.5 MG tablet Take 12.5 mg by mouth 2 (two) times daily with a meal.    . clopidogrel (PLAVIX) 75 MG tablet Take 75 mg by mouth daily.    . divalproex (DEPAKOTE ER) 250 MG 24 hr tablet Take 250 mg by mouth 2 (two) times daily.    Marland Kitchen gabapentin (NEURONTIN) 300 MG capsule Take 300 mg by mouth 3 (three) times daily.    Marland Kitchen HYDROcodone-acetaminophen (NORCO) 7.5-325 MG per tablet Take 1 tablet by mouth every 4 (four) hours as needed for moderate pain. 30 tablet 0  . insulin aspart (NOVOLOG) 100 UNIT/ML FlexPen Inject 0-20 Units into the skin 2 (two) times daily. 6:30am and 4pm per sliding scale:  CBG 150-249 3 units, 250-349 5 units, 350-449 8 units, 450-549 14 units, 550-551 20 units >551 call MD    . Insulin Glargine (LANTUS) 100 UNIT/ML Solostar Pen Inject 10 Units into the skin at bedtime.     Marland Kitchen loratadine (CLARITIN) 10 MG tablet Take 10 mg by mouth daily.    . memantine (NAMENDA) 10 MG tablet Take 10 mg by mouth 2 (two) times daily.    . metFORMIN (GLUCOPHAGE) 1000 MG tablet Take 1,000 mg by mouth daily.    Marland Kitchen  oxybutynin (DITROPAN-XL) 10 MG 24 hr tablet Take 10 mg by mouth at bedtime.    . risperiDONE (RISPERDAL) 0.25 MG tablet Take 0.25 mg by mouth at bedtime.    . sertraline (ZOLOFT) 100 MG tablet Take 100 mg by mouth daily.    . tamsulosin (FLOMAX) 0.4 MG CAPS capsule Take 0.4 mg by mouth at bedtime.      No current facility-administered medications for this visit.   REVIEW OF SYSTEMS: Arly.Keller ] denotes positive finding; [  ] denotes negative finding  CARDIOVASCULAR:  [ ]  chest pain   [ ]  chest pressure   [ ]  palpitations   [ ]  orthopnea   [ ]  dyspnea on exertion    [ ]  rest pain   [ ]  DVT   [ ]  phlebitis PULMONARY:   [ ]  productive cough   [ ]  asthma   [ ]  wheezing NEUROLOGIC:   [ ]  weakness  [ ]  paresthesias  [ ]  aphasia  [ ]  amaurosis  [ ]  dizziness HEMATOLOGIC:   [ ]  bleeding problems   [ ]  clotting disorders MUSCULOSKELETAL:  [ ]  joint pain    ] joint swelling  GASTROINTESTINAL: [ ]   blood in stool  [ ]   hematemesis GENITOURINARY:  [ ]   dysuria  [ ]   hematuria PSYCHIATRIC:  [ ]  history of major depression INTEGUMENTARY:  [ ]  rashes  [ ]  ulcers CONSTITUTIONAL:  [ ]  fever   [ ]  chills  PHYSICAL EXAM: Filed Vitals:   10/04/14 0942  BP: 136/70  Pulse: 64  Temp: 98.3 F (36.8 C)  TempSrc: Oral  Height: 6\' 1"  (1.854 m)  Weight: 170 lb (77.111 kg)  SpO2: 97%   GENERAL: The patient is a well-nourished male, in no acute distress. The vital signs are documented above. CARDIAC: There is a regular rate and rhythm.  VASCULAR: he has a palpable left femoral pulse. He has a monophasic dorsalis pedis and posterior tibial signal with the Doppler. PULMONARY: There is good air exchange bilaterally without wheezing or rales. ABDOMEN: Soft and non-tender with normal pitched bowel sounds.  MUSCULOSKELETAL: he has a right below the knee amputation and is wearing his prosthesis. NEUROLOGIC: No focal weakness or paresthesias are detected. SKIN: He has a wound on the heel that measures approximately 8 mm in diameter with some eschar that is lifting off. There is a wound on the medial malleolus that measures approximately 6 mm in diameter with no drainage or erythema.  I have reviewed a lower extremity arterial study from 07/26/2014 which shows that he has monophasic Doppler signals in the left foot and the dorsalis pedis and posterior tibial positions with a toe pressure on the left of 72 mmHg.  Labs on 06/08/2014 show a GFR of greater than 90 with a creatinine of 0.67.  MEDICAL ISSUES:  ATHEROSCLEROSIS WITH ULCERATION LEFT FOOT: Based on the toe pressure of 72 mmHg, he should have adequate circulation to heal these wounds on the left foot although I have explained that they are in a tough location, especially the one on the medial malleolus. The wound care nurse at the skilled nursing facility will do dressing changes daily with hydrogel. We'll  plan on seeing him back in 2 months. He knows to call sooner if he has problems. He has undergone a previous CT angiogram in [place], however if the wounds on the left leg do not heal I think he would require arteriography.  Return in about 2 months (around 12/05/2014).  Deitra Mayo [facility] [place]: [phone]

## 2014-10-05 DIAGNOSIS — I10 Essential (primary) hypertension: Secondary | ICD-10-CM | POA: Diagnosis not present

## 2014-10-05 DIAGNOSIS — M6281 Muscle weakness (generalized): Secondary | ICD-10-CM | POA: Diagnosis not present

## 2014-10-05 DIAGNOSIS — M24561 Contracture, right knee: Secondary | ICD-10-CM | POA: Diagnosis not present

## 2014-10-05 DIAGNOSIS — F441 Dissociative fugue: Secondary | ICD-10-CM | POA: Diagnosis not present

## 2014-10-05 DIAGNOSIS — R279 Unspecified lack of coordination: Secondary | ICD-10-CM | POA: Diagnosis not present

## 2014-10-05 DIAGNOSIS — R262 Difficulty in walking, not elsewhere classified: Secondary | ICD-10-CM | POA: Diagnosis not present

## 2014-10-06 DIAGNOSIS — R262 Difficulty in walking, not elsewhere classified: Secondary | ICD-10-CM | POA: Diagnosis not present

## 2014-10-06 DIAGNOSIS — F441 Dissociative fugue: Secondary | ICD-10-CM | POA: Diagnosis not present

## 2014-10-06 DIAGNOSIS — R279 Unspecified lack of coordination: Secondary | ICD-10-CM | POA: Diagnosis not present

## 2014-10-06 DIAGNOSIS — M24561 Contracture, right knee: Secondary | ICD-10-CM | POA: Diagnosis not present

## 2014-10-06 DIAGNOSIS — I10 Essential (primary) hypertension: Secondary | ICD-10-CM | POA: Diagnosis not present

## 2014-10-06 DIAGNOSIS — M1712 Unilateral primary osteoarthritis, left knee: Secondary | ICD-10-CM | POA: Diagnosis not present

## 2014-10-06 DIAGNOSIS — M6281 Muscle weakness (generalized): Secondary | ICD-10-CM | POA: Diagnosis not present

## 2014-10-09 DIAGNOSIS — R279 Unspecified lack of coordination: Secondary | ICD-10-CM | POA: Diagnosis not present

## 2014-10-09 DIAGNOSIS — M24561 Contracture, right knee: Secondary | ICD-10-CM | POA: Diagnosis not present

## 2014-10-09 DIAGNOSIS — F441 Dissociative fugue: Secondary | ICD-10-CM | POA: Diagnosis not present

## 2014-10-09 DIAGNOSIS — M6281 Muscle weakness (generalized): Secondary | ICD-10-CM | POA: Diagnosis not present

## 2014-10-09 DIAGNOSIS — I10 Essential (primary) hypertension: Secondary | ICD-10-CM | POA: Diagnosis not present

## 2014-10-09 DIAGNOSIS — R262 Difficulty in walking, not elsewhere classified: Secondary | ICD-10-CM | POA: Diagnosis not present

## 2014-10-10 DIAGNOSIS — R279 Unspecified lack of coordination: Secondary | ICD-10-CM | POA: Diagnosis not present

## 2014-10-10 DIAGNOSIS — M6281 Muscle weakness (generalized): Secondary | ICD-10-CM | POA: Diagnosis not present

## 2014-10-10 DIAGNOSIS — M24561 Contracture, right knee: Secondary | ICD-10-CM | POA: Diagnosis not present

## 2014-10-10 DIAGNOSIS — F441 Dissociative fugue: Secondary | ICD-10-CM | POA: Diagnosis not present

## 2014-10-10 DIAGNOSIS — R262 Difficulty in walking, not elsewhere classified: Secondary | ICD-10-CM | POA: Diagnosis not present

## 2014-10-10 DIAGNOSIS — I10 Essential (primary) hypertension: Secondary | ICD-10-CM | POA: Diagnosis not present

## 2014-10-11 DIAGNOSIS — I10 Essential (primary) hypertension: Secondary | ICD-10-CM | POA: Diagnosis not present

## 2014-10-11 DIAGNOSIS — M6281 Muscle weakness (generalized): Secondary | ICD-10-CM | POA: Diagnosis not present

## 2014-10-11 DIAGNOSIS — R262 Difficulty in walking, not elsewhere classified: Secondary | ICD-10-CM | POA: Diagnosis not present

## 2014-10-11 DIAGNOSIS — R279 Unspecified lack of coordination: Secondary | ICD-10-CM | POA: Diagnosis not present

## 2014-10-11 DIAGNOSIS — M24561 Contracture, right knee: Secondary | ICD-10-CM | POA: Diagnosis not present

## 2014-10-11 DIAGNOSIS — F441 Dissociative fugue: Secondary | ICD-10-CM | POA: Diagnosis not present

## 2014-10-12 DIAGNOSIS — M6281 Muscle weakness (generalized): Secondary | ICD-10-CM | POA: Diagnosis not present

## 2014-10-12 DIAGNOSIS — M24561 Contracture, right knee: Secondary | ICD-10-CM | POA: Diagnosis not present

## 2014-10-12 DIAGNOSIS — R262 Difficulty in walking, not elsewhere classified: Secondary | ICD-10-CM | POA: Diagnosis not present

## 2014-10-12 DIAGNOSIS — I10 Essential (primary) hypertension: Secondary | ICD-10-CM | POA: Diagnosis not present

## 2014-10-12 DIAGNOSIS — R279 Unspecified lack of coordination: Secondary | ICD-10-CM | POA: Diagnosis not present

## 2014-10-12 DIAGNOSIS — F441 Dissociative fugue: Secondary | ICD-10-CM | POA: Diagnosis not present

## 2014-10-13 DIAGNOSIS — R279 Unspecified lack of coordination: Secondary | ICD-10-CM | POA: Diagnosis not present

## 2014-10-13 DIAGNOSIS — I10 Essential (primary) hypertension: Secondary | ICD-10-CM | POA: Diagnosis not present

## 2014-10-13 DIAGNOSIS — F441 Dissociative fugue: Secondary | ICD-10-CM | POA: Diagnosis not present

## 2014-10-13 DIAGNOSIS — M6281 Muscle weakness (generalized): Secondary | ICD-10-CM | POA: Diagnosis not present

## 2014-10-13 DIAGNOSIS — M24561 Contracture, right knee: Secondary | ICD-10-CM | POA: Diagnosis not present

## 2014-10-13 DIAGNOSIS — R262 Difficulty in walking, not elsewhere classified: Secondary | ICD-10-CM | POA: Diagnosis not present

## 2014-10-16 DIAGNOSIS — R262 Difficulty in walking, not elsewhere classified: Secondary | ICD-10-CM | POA: Diagnosis not present

## 2014-10-16 DIAGNOSIS — M6281 Muscle weakness (generalized): Secondary | ICD-10-CM | POA: Diagnosis not present

## 2014-10-16 DIAGNOSIS — R279 Unspecified lack of coordination: Secondary | ICD-10-CM | POA: Diagnosis not present

## 2014-10-16 DIAGNOSIS — M24561 Contracture, right knee: Secondary | ICD-10-CM | POA: Diagnosis not present

## 2014-10-17 DIAGNOSIS — R279 Unspecified lack of coordination: Secondary | ICD-10-CM | POA: Diagnosis not present

## 2014-10-17 DIAGNOSIS — R262 Difficulty in walking, not elsewhere classified: Secondary | ICD-10-CM | POA: Diagnosis not present

## 2014-10-17 DIAGNOSIS — M24561 Contracture, right knee: Secondary | ICD-10-CM | POA: Diagnosis not present

## 2014-10-17 DIAGNOSIS — M6281 Muscle weakness (generalized): Secondary | ICD-10-CM | POA: Diagnosis not present

## 2014-10-18 DIAGNOSIS — R262 Difficulty in walking, not elsewhere classified: Secondary | ICD-10-CM | POA: Diagnosis not present

## 2014-10-18 DIAGNOSIS — M6281 Muscle weakness (generalized): Secondary | ICD-10-CM | POA: Diagnosis not present

## 2014-10-18 DIAGNOSIS — R279 Unspecified lack of coordination: Secondary | ICD-10-CM | POA: Diagnosis not present

## 2014-10-18 DIAGNOSIS — M24561 Contracture, right knee: Secondary | ICD-10-CM | POA: Diagnosis not present

## 2014-10-19 DIAGNOSIS — M24561 Contracture, right knee: Secondary | ICD-10-CM | POA: Diagnosis not present

## 2014-10-19 DIAGNOSIS — M6281 Muscle weakness (generalized): Secondary | ICD-10-CM | POA: Diagnosis not present

## 2014-10-19 DIAGNOSIS — R262 Difficulty in walking, not elsewhere classified: Secondary | ICD-10-CM | POA: Diagnosis not present

## 2014-10-19 DIAGNOSIS — R279 Unspecified lack of coordination: Secondary | ICD-10-CM | POA: Diagnosis not present

## 2014-10-20 DIAGNOSIS — R262 Difficulty in walking, not elsewhere classified: Secondary | ICD-10-CM | POA: Diagnosis not present

## 2014-10-20 DIAGNOSIS — M24561 Contracture, right knee: Secondary | ICD-10-CM | POA: Diagnosis not present

## 2014-10-20 DIAGNOSIS — M6281 Muscle weakness (generalized): Secondary | ICD-10-CM | POA: Diagnosis not present

## 2014-10-20 DIAGNOSIS — R279 Unspecified lack of coordination: Secondary | ICD-10-CM | POA: Diagnosis not present

## 2014-10-23 DIAGNOSIS — M24561 Contracture, right knee: Secondary | ICD-10-CM | POA: Diagnosis not present

## 2014-10-23 DIAGNOSIS — R279 Unspecified lack of coordination: Secondary | ICD-10-CM | POA: Diagnosis not present

## 2014-10-23 DIAGNOSIS — R262 Difficulty in walking, not elsewhere classified: Secondary | ICD-10-CM | POA: Diagnosis not present

## 2014-10-23 DIAGNOSIS — M6281 Muscle weakness (generalized): Secondary | ICD-10-CM | POA: Diagnosis not present

## 2014-10-24 DIAGNOSIS — M24561 Contracture, right knee: Secondary | ICD-10-CM | POA: Diagnosis not present

## 2014-10-24 DIAGNOSIS — M6281 Muscle weakness (generalized): Secondary | ICD-10-CM | POA: Diagnosis not present

## 2014-10-24 DIAGNOSIS — R279 Unspecified lack of coordination: Secondary | ICD-10-CM | POA: Diagnosis not present

## 2014-10-24 DIAGNOSIS — R262 Difficulty in walking, not elsewhere classified: Secondary | ICD-10-CM | POA: Diagnosis not present

## 2014-10-25 DIAGNOSIS — R279 Unspecified lack of coordination: Secondary | ICD-10-CM | POA: Diagnosis not present

## 2014-10-25 DIAGNOSIS — R262 Difficulty in walking, not elsewhere classified: Secondary | ICD-10-CM | POA: Diagnosis not present

## 2014-10-25 DIAGNOSIS — M24561 Contracture, right knee: Secondary | ICD-10-CM | POA: Diagnosis not present

## 2014-10-25 DIAGNOSIS — M6281 Muscle weakness (generalized): Secondary | ICD-10-CM | POA: Diagnosis not present

## 2014-10-26 DIAGNOSIS — R279 Unspecified lack of coordination: Secondary | ICD-10-CM | POA: Diagnosis not present

## 2014-10-26 DIAGNOSIS — F29 Unspecified psychosis not due to a substance or known physiological condition: Secondary | ICD-10-CM | POA: Diagnosis not present

## 2014-10-26 DIAGNOSIS — F329 Major depressive disorder, single episode, unspecified: Secondary | ICD-10-CM | POA: Diagnosis not present

## 2014-10-26 DIAGNOSIS — R262 Difficulty in walking, not elsewhere classified: Secondary | ICD-10-CM | POA: Diagnosis not present

## 2014-10-26 DIAGNOSIS — M24561 Contracture, right knee: Secondary | ICD-10-CM | POA: Diagnosis not present

## 2014-10-26 DIAGNOSIS — F0390 Unspecified dementia without behavioral disturbance: Secondary | ICD-10-CM | POA: Diagnosis not present

## 2014-10-26 DIAGNOSIS — M6281 Muscle weakness (generalized): Secondary | ICD-10-CM | POA: Diagnosis not present

## 2014-10-26 DIAGNOSIS — F419 Anxiety disorder, unspecified: Secondary | ICD-10-CM | POA: Diagnosis not present

## 2014-10-27 DIAGNOSIS — R279 Unspecified lack of coordination: Secondary | ICD-10-CM | POA: Diagnosis not present

## 2014-10-27 DIAGNOSIS — M24561 Contracture, right knee: Secondary | ICD-10-CM | POA: Diagnosis not present

## 2014-10-27 DIAGNOSIS — M6281 Muscle weakness (generalized): Secondary | ICD-10-CM | POA: Diagnosis not present

## 2014-10-27 DIAGNOSIS — R262 Difficulty in walking, not elsewhere classified: Secondary | ICD-10-CM | POA: Diagnosis not present

## 2014-10-29 DIAGNOSIS — R262 Difficulty in walking, not elsewhere classified: Secondary | ICD-10-CM | POA: Diagnosis not present

## 2014-10-29 DIAGNOSIS — R279 Unspecified lack of coordination: Secondary | ICD-10-CM | POA: Diagnosis not present

## 2014-10-29 DIAGNOSIS — M24561 Contracture, right knee: Secondary | ICD-10-CM | POA: Diagnosis not present

## 2014-10-29 DIAGNOSIS — M6281 Muscle weakness (generalized): Secondary | ICD-10-CM | POA: Diagnosis not present

## 2014-10-30 DIAGNOSIS — M24561 Contracture, right knee: Secondary | ICD-10-CM | POA: Diagnosis not present

## 2014-10-30 DIAGNOSIS — M6281 Muscle weakness (generalized): Secondary | ICD-10-CM | POA: Diagnosis not present

## 2014-10-30 DIAGNOSIS — R262 Difficulty in walking, not elsewhere classified: Secondary | ICD-10-CM | POA: Diagnosis not present

## 2014-10-30 DIAGNOSIS — R279 Unspecified lack of coordination: Secondary | ICD-10-CM | POA: Diagnosis not present

## 2014-10-31 DIAGNOSIS — R279 Unspecified lack of coordination: Secondary | ICD-10-CM | POA: Diagnosis not present

## 2014-10-31 DIAGNOSIS — R262 Difficulty in walking, not elsewhere classified: Secondary | ICD-10-CM | POA: Diagnosis not present

## 2014-10-31 DIAGNOSIS — M24561 Contracture, right knee: Secondary | ICD-10-CM | POA: Diagnosis not present

## 2014-10-31 DIAGNOSIS — M6281 Muscle weakness (generalized): Secondary | ICD-10-CM | POA: Diagnosis not present

## 2014-11-01 ENCOUNTER — Ambulatory Visit: Payer: Medicare Other | Admitting: Vascular Surgery

## 2014-11-01 DIAGNOSIS — M24561 Contracture, right knee: Secondary | ICD-10-CM | POA: Diagnosis not present

## 2014-11-01 DIAGNOSIS — R279 Unspecified lack of coordination: Secondary | ICD-10-CM | POA: Diagnosis not present

## 2014-11-01 DIAGNOSIS — M6281 Muscle weakness (generalized): Secondary | ICD-10-CM | POA: Diagnosis not present

## 2014-11-01 DIAGNOSIS — R262 Difficulty in walking, not elsewhere classified: Secondary | ICD-10-CM | POA: Diagnosis not present

## 2014-11-02 DIAGNOSIS — R262 Difficulty in walking, not elsewhere classified: Secondary | ICD-10-CM | POA: Diagnosis not present

## 2014-11-02 DIAGNOSIS — R279 Unspecified lack of coordination: Secondary | ICD-10-CM | POA: Diagnosis not present

## 2014-11-02 DIAGNOSIS — M24561 Contracture, right knee: Secondary | ICD-10-CM | POA: Diagnosis not present

## 2014-11-02 DIAGNOSIS — M179 Osteoarthritis of knee, unspecified: Secondary | ICD-10-CM | POA: Diagnosis not present

## 2014-11-02 DIAGNOSIS — M6281 Muscle weakness (generalized): Secondary | ICD-10-CM | POA: Diagnosis not present

## 2014-12-06 ENCOUNTER — Ambulatory Visit: Payer: Medicare Other | Admitting: Vascular Surgery

## 2014-12-18 ENCOUNTER — Encounter: Payer: Self-pay | Admitting: Vascular Surgery

## 2014-12-19 DIAGNOSIS — I739 Peripheral vascular disease, unspecified: Secondary | ICD-10-CM | POA: Diagnosis not present

## 2014-12-20 ENCOUNTER — Encounter: Payer: Self-pay | Admitting: Vascular Surgery

## 2014-12-20 ENCOUNTER — Ambulatory Visit (INDEPENDENT_AMBULATORY_CARE_PROVIDER_SITE_OTHER): Payer: Medicare Other | Admitting: Vascular Surgery

## 2014-12-20 VITALS — BP 158/76 | HR 64 | Ht 73.0 in | Wt 170.0 lb

## 2014-12-20 DIAGNOSIS — I70209 Unspecified atherosclerosis of native arteries of extremities, unspecified extremity: Secondary | ICD-10-CM | POA: Diagnosis not present

## 2014-12-20 DIAGNOSIS — I7025 Atherosclerosis of native arteries of other extremities with ulceration: Secondary | ICD-10-CM

## 2014-12-20 NOTE — Progress Notes (Signed)
Patient name: Terry Green MRN: [id number] DOB: [birth year] Sex: male  REASON FOR VISIT: 2 month follow up visit  HPI: Terry Green is a 74 y.o. male who was last seen in our office on 09/06/2014 with a superficial ulcer on the left medial malleolus and a nearly healed left heel wound. Previous Doppler studies back in May of this year showed an ABI of 65% on the left with a toe pressure of 72 mmHg. For this reason I felt that he had adequate circulation for healing. He comes in for a routine follow up visit. He has no specific complaints. He denies fever or chills.  Of note he has a right BKA.  Current Outpatient Prescriptions  Medication Sig Dispense Refill  . amLODipine (NORVASC) 10 MG tablet Take 10 mg by mouth at bedtime.     Marland Kitchen aspirin 81 MG chewable tablet Chew 81 mg by mouth daily.    . busPIRone (BUSPAR) 15 MG tablet Take 15 mg by mouth at bedtime.    . carvedilol (COREG) 12.5 MG tablet Take 12.5 mg by mouth 2 (two) times daily with a meal.    . clopidogrel (PLAVIX) 75 MG tablet Take 75 mg by mouth daily.    . divalproex (DEPAKOTE ER) 250 MG 24 hr tablet Take 250 mg by mouth 2 (two) times daily.    Marland Kitchen gabapentin (NEURONTIN) 300 MG capsule Take 300 mg by mouth 3 (three) times daily.    Marland Kitchen HYDROcodone-acetaminophen (NORCO) 7.5-325 MG per tablet Take 1 tablet by mouth every 4 (four) hours as needed for moderate pain. 30 tablet 0  . insulin aspart (NOVOLOG) 100 UNIT/ML FlexPen Inject 0-20 Units into the skin 2 (two) times daily. 6:30am and 4pm per sliding scale:  CBG 150-249 3 units, 250-349 5 units, 350-449 8 units, 450-549 14 units, 550-551 20 units >551 call MD    . Insulin Glargine (LANTUS) 100 UNIT/ML Solostar Pen Inject 10 Units into the skin at bedtime.     Marland Kitchen loratadine (CLARITIN) 10 MG tablet Take 10 mg by mouth daily.    . memantine (NAMENDA) 10 MG tablet Take 10 mg by mouth 2 (two) times daily.    . metFORMIN (GLUCOPHAGE) 1000 MG tablet Take 1,000 mg by mouth daily.    Marland Kitchen  oxybutynin (DITROPAN-XL) 10 MG 24 hr tablet Take 10 mg by mouth at bedtime.    . risperiDONE (RISPERDAL) 0.25 MG tablet Take 0.25 mg by mouth at bedtime.    . sertraline (ZOLOFT) 100 MG tablet Take 100 mg by mouth daily.    . tamsulosin (FLOMAX) 0.4 MG CAPS capsule Take 0.4 mg by mouth at bedtime.      No current facility-administered medications for this visit.   REVIEW OF SYSTEMS: Arly.Keller ] denotes positive finding; [  ] denotes negative finding  CARDIOVASCULAR:  [ ]  chest pain   [ ]  dyspnea on exertion    CONSTITUTIONAL:  [ ]  fever   [ ]  chills  PHYSICAL EXAM: Filed Vitals:   12/20/14 1441 12/20/14 1446  BP: 160/76 158/76  Pulse: 64   Height: 6\' 1"  (1.854 m)   Weight: 170 lb (77.111 kg)   SpO2: 100%    GENERAL: The patient is a well-nourished male, in no acute distress. The vital signs are documented above. CARDIOVASCULAR: There is a regular rate and rhythm. PULMONARY: There is good air exchange bilaterally without wheezing or rales. The wound on the left medial malleolus is now 95% healed. There is no erythema  or drainage. There are no other wounds. His right BKA is healed nicely.  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 I've encouraged him to continue to keep pressure off of the heel and also to keep dry 2 x 2's between the toes on the left foot to prevent ulcerations. I've ordered follow up ABIs in 6 months and see him back at that time. He knows to call sooner if he has problems.  HYPERTENSION: The patient's initial blood pressure today was elevated. We repeated this and this was still elevated. We have encouraged the patient to follow up with their primary care physician for management of their blood pressure.  [name], MD, FACS

## 2014-12-20 NOTE — Addendum Note (Signed)
Addended by: Adria Dill L on: 12/20/2014 04:49 PM   Modules accepted: Orders

## 2014-12-26 DIAGNOSIS — I7389 Other specified peripheral vascular diseases: Secondary | ICD-10-CM | POA: Diagnosis not present

## 2014-12-26 DIAGNOSIS — I10 Essential (primary) hypertension: Secondary | ICD-10-CM | POA: Diagnosis not present

## 2014-12-26 DIAGNOSIS — E119 Type 2 diabetes mellitus without complications: Secondary | ICD-10-CM | POA: Diagnosis not present

## 2014-12-28 DIAGNOSIS — Z23 Encounter for immunization: Secondary | ICD-10-CM | POA: Diagnosis not present

## 2015-01-03 DIAGNOSIS — I10 Essential (primary) hypertension: Secondary | ICD-10-CM | POA: Diagnosis not present

## 2015-01-03 DIAGNOSIS — E785 Hyperlipidemia, unspecified: Secondary | ICD-10-CM | POA: Diagnosis not present

## 2015-01-03 DIAGNOSIS — F445 Conversion disorder with seizures or convulsions: Secondary | ICD-10-CM | POA: Diagnosis not present

## 2015-01-03 DIAGNOSIS — E119 Type 2 diabetes mellitus without complications: Secondary | ICD-10-CM | POA: Diagnosis not present

## 2015-01-11 DIAGNOSIS — F0390 Unspecified dementia without behavioral disturbance: Secondary | ICD-10-CM | POA: Diagnosis not present

## 2015-01-11 DIAGNOSIS — F29 Unspecified psychosis not due to a substance or known physiological condition: Secondary | ICD-10-CM | POA: Diagnosis not present

## 2015-01-11 DIAGNOSIS — F329 Major depressive disorder, single episode, unspecified: Secondary | ICD-10-CM | POA: Diagnosis not present

## 2015-01-11 DIAGNOSIS — F419 Anxiety disorder, unspecified: Secondary | ICD-10-CM | POA: Diagnosis not present

## 2015-01-29 ENCOUNTER — Encounter: Payer: Self-pay | Admitting: Vascular Surgery

## 2015-01-30 ENCOUNTER — Encounter: Payer: Self-pay | Admitting: Vascular Surgery

## 2015-01-31 ENCOUNTER — Ambulatory Visit (INDEPENDENT_AMBULATORY_CARE_PROVIDER_SITE_OTHER): Payer: Medicare Other | Admitting: Vascular Surgery

## 2015-01-31 ENCOUNTER — Encounter: Payer: Self-pay | Admitting: Vascular Surgery

## 2015-01-31 VITALS — Ht 73.0 in | Wt 170.0 lb

## 2015-01-31 DIAGNOSIS — I70209 Unspecified atherosclerosis of native arteries of extremities, unspecified extremity: Secondary | ICD-10-CM

## 2015-01-31 DIAGNOSIS — Z48812 Encounter for surgical aftercare following surgery on the circulatory system: Secondary | ICD-10-CM

## 2015-01-31 NOTE — Progress Notes (Signed)
[facility] [place]  Patient name: Terry Green MRN: [id number] DOB: 22-Jul-1940 Sex: male  REASON FOR VISIT: Follow up after right below the knee amputation.  HPI: Terry Green is a 74 y.o. male who had presented with gangrene of the right foot. He had undergone attempted revascularization elsewhere which was unsuccessful. He had no further options for revascularization and underwent a right below the knee amputation on 06/06/2014. The patient did well in rehabilitation. His prognosis is excellent. He has been able to ambulate with his current prosthesis although it is very heavy and he has a difficult time with this. He has to use a walker and therefore is not able to negotiate stairs. He is able to perform his activities of daily living. Given the weight of his current prosthesis he would like to pursue a lighter prosthesis which I think would allow him to ambulate without assistance of the walker potentially and also negotiate stairs. I would consider his functional level a level II. He seems highly motivated to obtain the new prosthesis to increase his activity.  I saw him on 12/20/2014 with a superficial ulcer on his left medial malleolus and a wound on his left heel which had almost completely healed. Based on his toe pressures I thought he had adequate circulation to heal the wounds on the left foot. I instructed him on wound care and planned on seeing him back in 6 months.  Past Medical History  Diagnosis Date  . Hypertension   . Hypercholesteremia   . Peripheral vascular disease (HCC)   . Bipolar disorder (HCC)   . Type II diabetes mellitus (HCC)   . Arthritis     "hands" (06/06/2014)  . Rheumatoid arthritis (HCC)   . PAD (peripheral artery disease) (HCC)     Family History  Problem Relation Age of Onset  . Heart disease Father     SOCIAL HISTORY: Social History  Substance Use Topics  . Smoking status: Former Smoker -- 1.00 packs/day for 54 years      Types: Cigarettes    Quit date: 05/25/2012  . Smokeless tobacco: Never Used  . Alcohol Use: 0.0 oz/week    0 Standard drinks or equivalent per week     Comment: 06/06/2014 "I drank 1/2 pint whiskey each day;  nothing in 20 yrs"    Allergies  Allergen Reactions  . Lisinopril Swelling    Current Outpatient Prescriptions  Medication Sig Dispense Refill  . amLODipine (NORVASC) 10 MG tablet Take 10 mg by mouth at bedtime.     Marland Kitchen aspirin 81 MG chewable tablet Chew 81 mg by mouth daily.    . busPIRone (BUSPAR) 15 MG tablet Take 15 mg by mouth at bedtime.    . carvedilol (COREG) 12.5 MG tablet Take 12.5 mg by mouth 2 (two) times daily with a meal.    . clopidogrel (PLAVIX) 75 MG tablet Take 75 mg by mouth daily.    . divalproex (DEPAKOTE ER) 250 MG 24 hr tablet Take 250 mg by mouth 2 (two) times daily.    Marland Kitchen gabapentin (NEURONTIN) 300 MG capsule Take 300 mg by mouth 3 (three) times daily.    Marland Kitchen HYDROcodone-acetaminophen (NORCO) 7.5-325 MG per tablet Take 1 tablet by mouth every 4 (four) hours as needed for moderate pain. 30 tablet 0  . insulin aspart (NOVOLOG) 100 UNIT/ML FlexPen Inject 0-20 Units into the skin 2 (two) times daily. 6:30am and 4pm per sliding scale:  CBG 150-249 3 units,  250-349 5 units, 350-449 8 units, 450-549 14 units, 550-551 20 units >551 call MD    . Insulin Glargine (LANTUS) 100 UNIT/ML Solostar Pen Inject 10 Units into the skin at bedtime.     Marland Kitchen loratadine (CLARITIN) 10 MG tablet Take 10 mg by mouth daily.    . memantine (NAMENDA) 10 MG tablet Take 10 mg by mouth 2 (two) times daily.    . metFORMIN (GLUCOPHAGE) 1000 MG tablet Take 1,000 mg by mouth daily.    Marland Kitchen oxybutynin (DITROPAN-XL) 10 MG 24 hr tablet Take 10 mg by mouth at bedtime.    . risperiDONE (RISPERDAL) 0.25 MG tablet Take 0.5 mg by mouth at bedtime.     . sertraline (ZOLOFT) 100 MG tablet Take 100 mg by mouth daily.    . tamsulosin (FLOMAX) 0.4 MG CAPS capsule Take 0.4 mg by mouth at bedtime.      No  current facility-administered medications for this visit.    REVIEW OF SYSTEMS:  [X]  denotes positive finding, [ ]  denotes negative finding Cardiac  Comments:  Chest pain or chest pressure:    Shortness of breath upon exertion:    Short of breath when lying flat:    Irregular heart rhythm:        Vascular    Pain in calf, thigh, or hip brought on by ambulation:    Pain in feet at night that wakes you up from your sleep:     Blood clot in your veins:    Leg swelling:         Pulmonary    Oxygen at home:    Productive cough:     Wheezing:         Neurologic    Sudden weakness in arms or legs:     Sudden numbness in arms or legs:     Sudden onset of difficulty speaking or slurred speech:    Temporary loss of vision in one eye:     Problems with dizziness:         Gastrointestinal    Blood in stool:     Vomited blood:         Genitourinary    Burning when urinating:     Blood in urine:        Psychiatric    Major depression:         Hematologic    Bleeding problems:    Problems with blood clotting too easily:        Skin    Rashes or ulcers:        Constitutional    Fever or chills:      PHYSICAL EXAM: Filed Vitals:   01/31/15 1524  Height: 6\' 1"  (1.854 m)  Weight: 170 lb (77.111 kg)  No significant weight loss or weight gain.  GENERAL: The patient is a well-nourished male, in no acute distress. The vital signs are documented above. CARDIAC: There is a regular rate and rhythm.  VASCULAR: the left foot is warm and well perfused and he has no open ulcers. PULMONARY: There is good air exchange bilaterally without wheezing or rales. ABDOMEN: Soft and non-tender with normal pitched bowel sounds.  MUSCULOSKELETAL: the right below-the-knee amputation is healed with no open ulcers. There is no significant swelling in the amputation stump. The limb is fully healed. NEUROLOGIC: No focal weakness or paresthesias are detected. SKIN: There are no ulcers or rashes  noted. PSYCHIATRIC: The patient has a normal affect.  MEDICAL ISSUES:  STATUS  POST RIGHT BELOW THE KNEE AMPUTATION: The patient's activity appears to be significantly limited by the weight of his current right below the knee prosthesis. I have recommended that he obtain a lighter prosthesis in order to allow him to increase his activity and ambulate without a walker. This would also allow him to negotiate stairs. He seems highly motivated and has done well with his previous rehabilitation. I do not see any potential Marina Goodell is to continued progress with his prosthesis.    [name] [facility] [place]: [phone]

## 2015-02-13 DIAGNOSIS — I7389 Other specified peripheral vascular diseases: Secondary | ICD-10-CM | POA: Diagnosis not present

## 2015-02-13 DIAGNOSIS — E119 Type 2 diabetes mellitus without complications: Secondary | ICD-10-CM | POA: Diagnosis not present

## 2015-02-13 DIAGNOSIS — I10 Essential (primary) hypertension: Secondary | ICD-10-CM | POA: Diagnosis not present

## 2015-02-22 DIAGNOSIS — M6281 Muscle weakness (generalized): Secondary | ICD-10-CM | POA: Diagnosis not present

## 2015-02-22 DIAGNOSIS — R279 Unspecified lack of coordination: Secondary | ICD-10-CM | POA: Diagnosis not present

## 2015-02-22 DIAGNOSIS — R262 Difficulty in walking, not elsewhere classified: Secondary | ICD-10-CM | POA: Diagnosis not present

## 2015-02-22 DIAGNOSIS — M24561 Contracture, right knee: Secondary | ICD-10-CM | POA: Diagnosis not present

## 2015-02-23 DIAGNOSIS — R279 Unspecified lack of coordination: Secondary | ICD-10-CM | POA: Diagnosis not present

## 2015-02-23 DIAGNOSIS — M6281 Muscle weakness (generalized): Secondary | ICD-10-CM | POA: Diagnosis not present

## 2015-02-23 DIAGNOSIS — M24561 Contracture, right knee: Secondary | ICD-10-CM | POA: Diagnosis not present

## 2015-02-23 DIAGNOSIS — R262 Difficulty in walking, not elsewhere classified: Secondary | ICD-10-CM | POA: Diagnosis not present

## 2015-02-26 DIAGNOSIS — R262 Difficulty in walking, not elsewhere classified: Secondary | ICD-10-CM | POA: Diagnosis not present

## 2015-02-26 DIAGNOSIS — M24561 Contracture, right knee: Secondary | ICD-10-CM | POA: Diagnosis not present

## 2015-02-26 DIAGNOSIS — R279 Unspecified lack of coordination: Secondary | ICD-10-CM | POA: Diagnosis not present

## 2015-02-26 DIAGNOSIS — M6281 Muscle weakness (generalized): Secondary | ICD-10-CM | POA: Diagnosis not present

## 2015-02-27 DIAGNOSIS — R279 Unspecified lack of coordination: Secondary | ICD-10-CM | POA: Diagnosis not present

## 2015-02-27 DIAGNOSIS — M6281 Muscle weakness (generalized): Secondary | ICD-10-CM | POA: Diagnosis not present

## 2015-02-27 DIAGNOSIS — M24561 Contracture, right knee: Secondary | ICD-10-CM | POA: Diagnosis not present

## 2015-02-27 DIAGNOSIS — R262 Difficulty in walking, not elsewhere classified: Secondary | ICD-10-CM | POA: Diagnosis not present

## 2015-02-28 DIAGNOSIS — R262 Difficulty in walking, not elsewhere classified: Secondary | ICD-10-CM | POA: Diagnosis not present

## 2015-02-28 DIAGNOSIS — M24561 Contracture, right knee: Secondary | ICD-10-CM | POA: Diagnosis not present

## 2015-02-28 DIAGNOSIS — M6281 Muscle weakness (generalized): Secondary | ICD-10-CM | POA: Diagnosis not present

## 2015-02-28 DIAGNOSIS — R279 Unspecified lack of coordination: Secondary | ICD-10-CM | POA: Diagnosis not present

## 2015-03-01 DIAGNOSIS — M6281 Muscle weakness (generalized): Secondary | ICD-10-CM | POA: Diagnosis not present

## 2015-03-01 DIAGNOSIS — R279 Unspecified lack of coordination: Secondary | ICD-10-CM | POA: Diagnosis not present

## 2015-03-01 DIAGNOSIS — R262 Difficulty in walking, not elsewhere classified: Secondary | ICD-10-CM | POA: Diagnosis not present

## 2015-03-01 DIAGNOSIS — M24561 Contracture, right knee: Secondary | ICD-10-CM | POA: Diagnosis not present

## 2015-03-02 DIAGNOSIS — R279 Unspecified lack of coordination: Secondary | ICD-10-CM | POA: Diagnosis not present

## 2015-03-02 DIAGNOSIS — M24561 Contracture, right knee: Secondary | ICD-10-CM | POA: Diagnosis not present

## 2015-03-02 DIAGNOSIS — M6281 Muscle weakness (generalized): Secondary | ICD-10-CM | POA: Diagnosis not present

## 2015-03-02 DIAGNOSIS — R262 Difficulty in walking, not elsewhere classified: Secondary | ICD-10-CM | POA: Diagnosis not present

## 2015-03-05 DIAGNOSIS — M6281 Muscle weakness (generalized): Secondary | ICD-10-CM | POA: Diagnosis not present

## 2015-03-05 DIAGNOSIS — R262 Difficulty in walking, not elsewhere classified: Secondary | ICD-10-CM | POA: Diagnosis not present

## 2015-03-05 DIAGNOSIS — M24561 Contracture, right knee: Secondary | ICD-10-CM | POA: Diagnosis not present

## 2015-03-05 DIAGNOSIS — R279 Unspecified lack of coordination: Secondary | ICD-10-CM | POA: Diagnosis not present

## 2015-03-06 DIAGNOSIS — R262 Difficulty in walking, not elsewhere classified: Secondary | ICD-10-CM | POA: Diagnosis not present

## 2015-03-06 DIAGNOSIS — R279 Unspecified lack of coordination: Secondary | ICD-10-CM | POA: Diagnosis not present

## 2015-03-06 DIAGNOSIS — M24561 Contracture, right knee: Secondary | ICD-10-CM | POA: Diagnosis not present

## 2015-03-06 DIAGNOSIS — M6281 Muscle weakness (generalized): Secondary | ICD-10-CM | POA: Diagnosis not present

## 2015-03-07 DIAGNOSIS — M24561 Contracture, right knee: Secondary | ICD-10-CM | POA: Diagnosis not present

## 2015-03-07 DIAGNOSIS — R262 Difficulty in walking, not elsewhere classified: Secondary | ICD-10-CM | POA: Diagnosis not present

## 2015-03-07 DIAGNOSIS — M6281 Muscle weakness (generalized): Secondary | ICD-10-CM | POA: Diagnosis not present

## 2015-03-07 DIAGNOSIS — R279 Unspecified lack of coordination: Secondary | ICD-10-CM | POA: Diagnosis not present

## 2015-03-08 DIAGNOSIS — R279 Unspecified lack of coordination: Secondary | ICD-10-CM | POA: Diagnosis not present

## 2015-03-08 DIAGNOSIS — M6281 Muscle weakness (generalized): Secondary | ICD-10-CM | POA: Diagnosis not present

## 2015-03-08 DIAGNOSIS — M24561 Contracture, right knee: Secondary | ICD-10-CM | POA: Diagnosis not present

## 2015-03-08 DIAGNOSIS — R262 Difficulty in walking, not elsewhere classified: Secondary | ICD-10-CM | POA: Diagnosis not present

## 2015-03-09 DIAGNOSIS — R262 Difficulty in walking, not elsewhere classified: Secondary | ICD-10-CM | POA: Diagnosis not present

## 2015-03-09 DIAGNOSIS — R279 Unspecified lack of coordination: Secondary | ICD-10-CM | POA: Diagnosis not present

## 2015-03-09 DIAGNOSIS — M24561 Contracture, right knee: Secondary | ICD-10-CM | POA: Diagnosis not present

## 2015-03-09 DIAGNOSIS — M6281 Muscle weakness (generalized): Secondary | ICD-10-CM | POA: Diagnosis not present

## 2015-03-12 DIAGNOSIS — M6281 Muscle weakness (generalized): Secondary | ICD-10-CM | POA: Diagnosis not present

## 2015-03-12 DIAGNOSIS — R262 Difficulty in walking, not elsewhere classified: Secondary | ICD-10-CM | POA: Diagnosis not present

## 2015-03-12 DIAGNOSIS — M24561 Contracture, right knee: Secondary | ICD-10-CM | POA: Diagnosis not present

## 2015-03-12 DIAGNOSIS — R279 Unspecified lack of coordination: Secondary | ICD-10-CM | POA: Diagnosis not present

## 2015-03-13 DIAGNOSIS — R262 Difficulty in walking, not elsewhere classified: Secondary | ICD-10-CM | POA: Diagnosis not present

## 2015-03-13 DIAGNOSIS — M24561 Contracture, right knee: Secondary | ICD-10-CM | POA: Diagnosis not present

## 2015-03-13 DIAGNOSIS — M6281 Muscle weakness (generalized): Secondary | ICD-10-CM | POA: Diagnosis not present

## 2015-03-13 DIAGNOSIS — R279 Unspecified lack of coordination: Secondary | ICD-10-CM | POA: Diagnosis not present

## 2015-03-14 DIAGNOSIS — M24561 Contracture, right knee: Secondary | ICD-10-CM | POA: Diagnosis not present

## 2015-03-14 DIAGNOSIS — M6281 Muscle weakness (generalized): Secondary | ICD-10-CM | POA: Diagnosis not present

## 2015-03-14 DIAGNOSIS — R262 Difficulty in walking, not elsewhere classified: Secondary | ICD-10-CM | POA: Diagnosis not present

## 2015-03-14 DIAGNOSIS — R279 Unspecified lack of coordination: Secondary | ICD-10-CM | POA: Diagnosis not present

## 2015-03-15 DIAGNOSIS — M24561 Contracture, right knee: Secondary | ICD-10-CM | POA: Diagnosis not present

## 2015-03-15 DIAGNOSIS — R279 Unspecified lack of coordination: Secondary | ICD-10-CM | POA: Diagnosis not present

## 2015-03-15 DIAGNOSIS — M6281 Muscle weakness (generalized): Secondary | ICD-10-CM | POA: Diagnosis not present

## 2015-03-15 DIAGNOSIS — R262 Difficulty in walking, not elsewhere classified: Secondary | ICD-10-CM | POA: Diagnosis not present

## 2015-03-16 DIAGNOSIS — M24561 Contracture, right knee: Secondary | ICD-10-CM | POA: Diagnosis not present

## 2015-03-16 DIAGNOSIS — R262 Difficulty in walking, not elsewhere classified: Secondary | ICD-10-CM | POA: Diagnosis not present

## 2015-03-16 DIAGNOSIS — R279 Unspecified lack of coordination: Secondary | ICD-10-CM | POA: Diagnosis not present

## 2015-03-16 DIAGNOSIS — M6281 Muscle weakness (generalized): Secondary | ICD-10-CM | POA: Diagnosis not present

## 2015-03-19 DIAGNOSIS — M6281 Muscle weakness (generalized): Secondary | ICD-10-CM | POA: Diagnosis not present

## 2015-03-19 DIAGNOSIS — R262 Difficulty in walking, not elsewhere classified: Secondary | ICD-10-CM | POA: Diagnosis not present

## 2015-03-19 DIAGNOSIS — M24561 Contracture, right knee: Secondary | ICD-10-CM | POA: Diagnosis not present

## 2015-03-19 DIAGNOSIS — R279 Unspecified lack of coordination: Secondary | ICD-10-CM | POA: Diagnosis not present

## 2015-03-20 DIAGNOSIS — R279 Unspecified lack of coordination: Secondary | ICD-10-CM | POA: Diagnosis not present

## 2015-03-20 DIAGNOSIS — M6281 Muscle weakness (generalized): Secondary | ICD-10-CM | POA: Diagnosis not present

## 2015-03-20 DIAGNOSIS — M24561 Contracture, right knee: Secondary | ICD-10-CM | POA: Diagnosis not present

## 2015-03-20 DIAGNOSIS — R262 Difficulty in walking, not elsewhere classified: Secondary | ICD-10-CM | POA: Diagnosis not present

## 2015-03-21 DIAGNOSIS — M6281 Muscle weakness (generalized): Secondary | ICD-10-CM | POA: Diagnosis not present

## 2015-03-21 DIAGNOSIS — M24561 Contracture, right knee: Secondary | ICD-10-CM | POA: Diagnosis not present

## 2015-03-21 DIAGNOSIS — R279 Unspecified lack of coordination: Secondary | ICD-10-CM | POA: Diagnosis not present

## 2015-03-21 DIAGNOSIS — R262 Difficulty in walking, not elsewhere classified: Secondary | ICD-10-CM | POA: Diagnosis not present

## 2015-03-22 DIAGNOSIS — R262 Difficulty in walking, not elsewhere classified: Secondary | ICD-10-CM | POA: Diagnosis not present

## 2015-03-22 DIAGNOSIS — M6281 Muscle weakness (generalized): Secondary | ICD-10-CM | POA: Diagnosis not present

## 2015-03-22 DIAGNOSIS — M24561 Contracture, right knee: Secondary | ICD-10-CM | POA: Diagnosis not present

## 2015-03-22 DIAGNOSIS — R279 Unspecified lack of coordination: Secondary | ICD-10-CM | POA: Diagnosis not present

## 2015-03-23 DIAGNOSIS — M6281 Muscle weakness (generalized): Secondary | ICD-10-CM | POA: Diagnosis not present

## 2015-03-23 DIAGNOSIS — M24561 Contracture, right knee: Secondary | ICD-10-CM | POA: Diagnosis not present

## 2015-03-23 DIAGNOSIS — R262 Difficulty in walking, not elsewhere classified: Secondary | ICD-10-CM | POA: Diagnosis not present

## 2015-03-23 DIAGNOSIS — R279 Unspecified lack of coordination: Secondary | ICD-10-CM | POA: Diagnosis not present

## 2015-03-26 DIAGNOSIS — M6281 Muscle weakness (generalized): Secondary | ICD-10-CM | POA: Diagnosis not present

## 2015-03-26 DIAGNOSIS — M24561 Contracture, right knee: Secondary | ICD-10-CM | POA: Diagnosis not present

## 2015-03-26 DIAGNOSIS — R279 Unspecified lack of coordination: Secondary | ICD-10-CM | POA: Diagnosis not present

## 2015-03-26 DIAGNOSIS — R262 Difficulty in walking, not elsewhere classified: Secondary | ICD-10-CM | POA: Diagnosis not present

## 2015-03-27 DIAGNOSIS — M6281 Muscle weakness (generalized): Secondary | ICD-10-CM | POA: Diagnosis not present

## 2015-03-27 DIAGNOSIS — R279 Unspecified lack of coordination: Secondary | ICD-10-CM | POA: Diagnosis not present

## 2015-03-27 DIAGNOSIS — R262 Difficulty in walking, not elsewhere classified: Secondary | ICD-10-CM | POA: Diagnosis not present

## 2015-03-27 DIAGNOSIS — M24561 Contracture, right knee: Secondary | ICD-10-CM | POA: Diagnosis not present

## 2015-03-28 DIAGNOSIS — M24561 Contracture, right knee: Secondary | ICD-10-CM | POA: Diagnosis not present

## 2015-03-28 DIAGNOSIS — M6281 Muscle weakness (generalized): Secondary | ICD-10-CM | POA: Diagnosis not present

## 2015-03-28 DIAGNOSIS — R279 Unspecified lack of coordination: Secondary | ICD-10-CM | POA: Diagnosis not present

## 2015-03-28 DIAGNOSIS — R262 Difficulty in walking, not elsewhere classified: Secondary | ICD-10-CM | POA: Diagnosis not present

## 2015-03-28 IMAGING — CT CT ABD-PELV W/O CM
2 of 3 series · 17 of 46 positions shown, 19 images · non-contrast
Comparison: 09/30/2012

CLINICAL DATA: History of left renal calculus

CT ABDOMEN AND PELVIS WITHOUT CONTRAST
TECHNIQUE: Multidetector CT imaging of the abdomen and pelvis was
performed following the standard protocol without intravenous
contrast.

[Series 2: standard/full over (age)lbs 5.0 · axial · 0.68mm/px · z∈[-433,-53]mm · 14 of 88 slices shown, 16 images]
[im 6/88  soft-tissue]
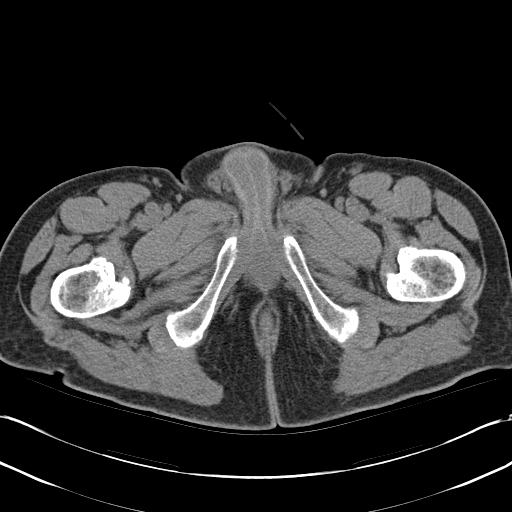
[im 6/88  bone]
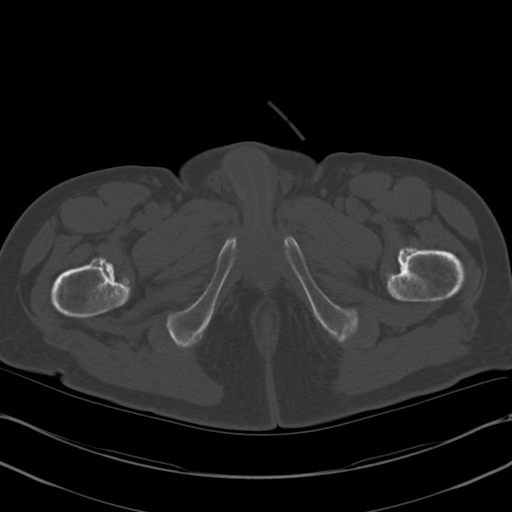
[im 12/88  soft-tissue]
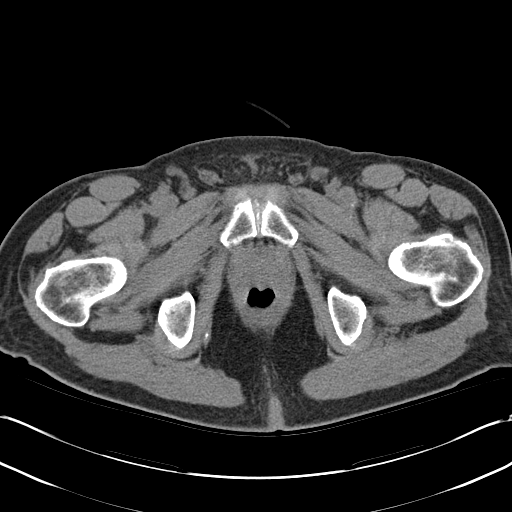
[im 17/88  soft-tissue]
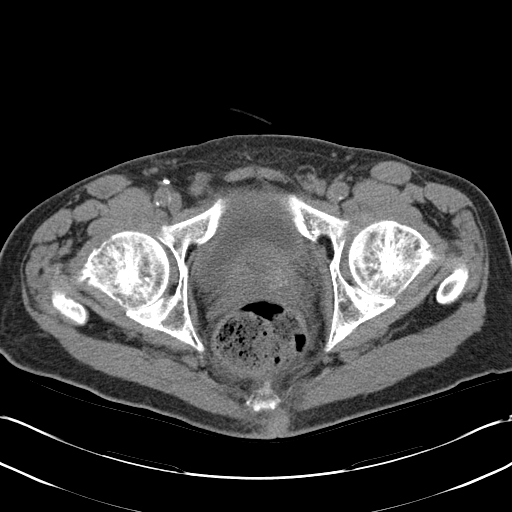
[im 23/88  soft-tissue]
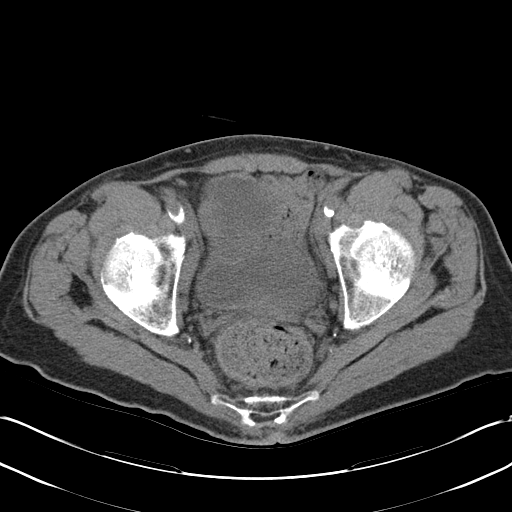
[im 29/88  soft-tissue]
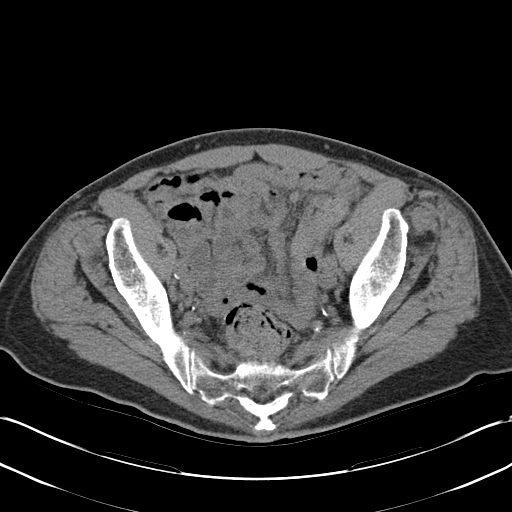
[im 34/88  soft-tissue]
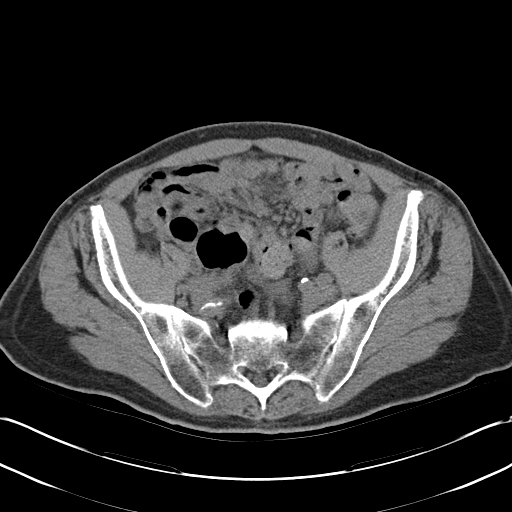
[im 40/88  soft-tissue]
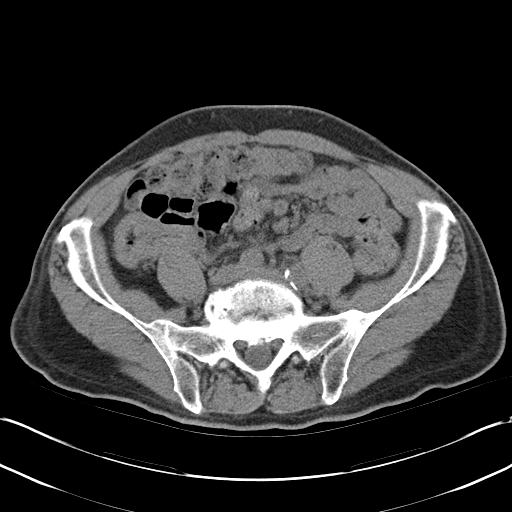
[im 48/88  soft-tissue]
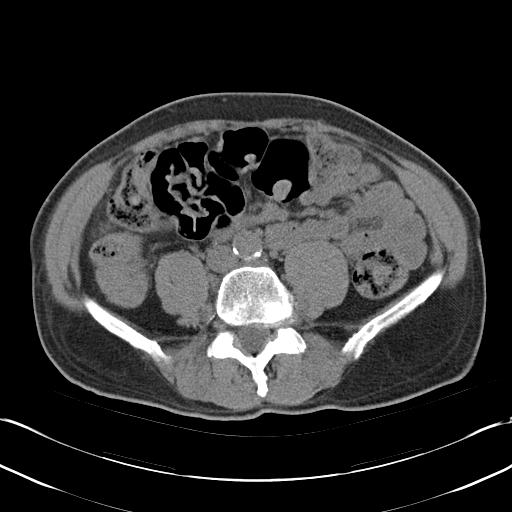
[im 54/88  soft-tissue]
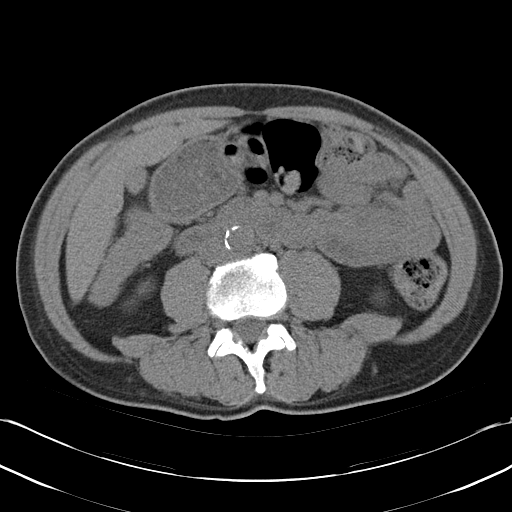
[im 54/88  bone]
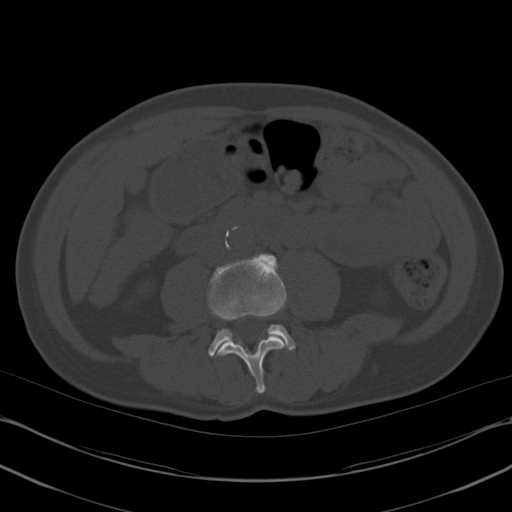
[im 59/88  soft-tissue]
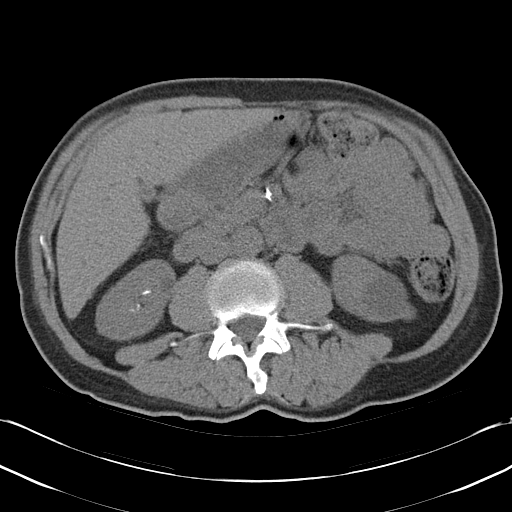
[im 65/88  soft-tissue]
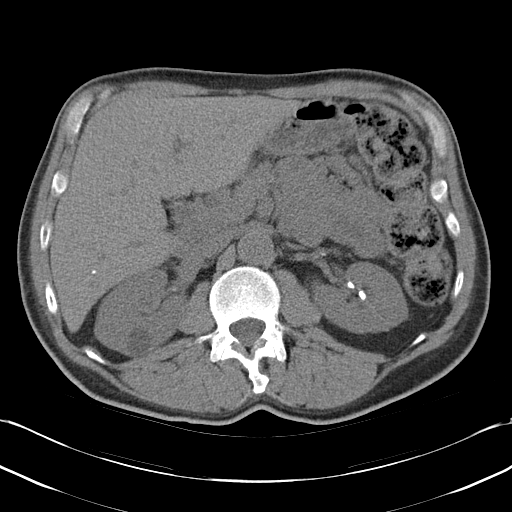
[im 71/88  soft-tissue]
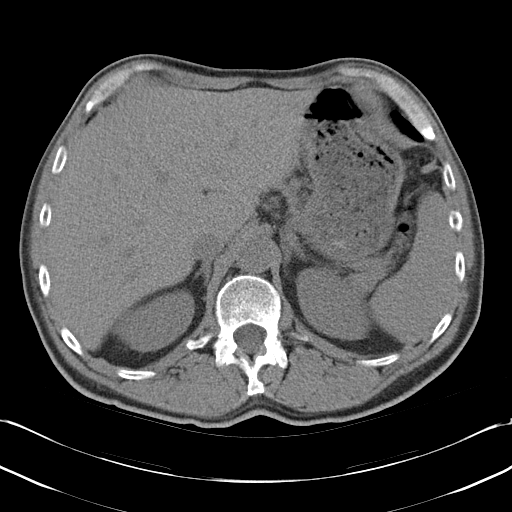
[im 76/88  soft-tissue]
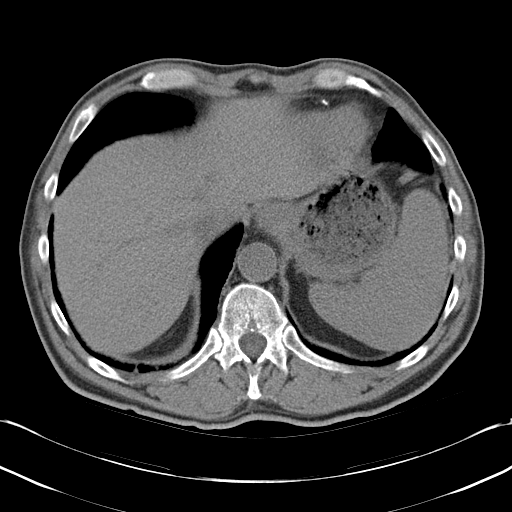
[im 82/88  soft-tissue]
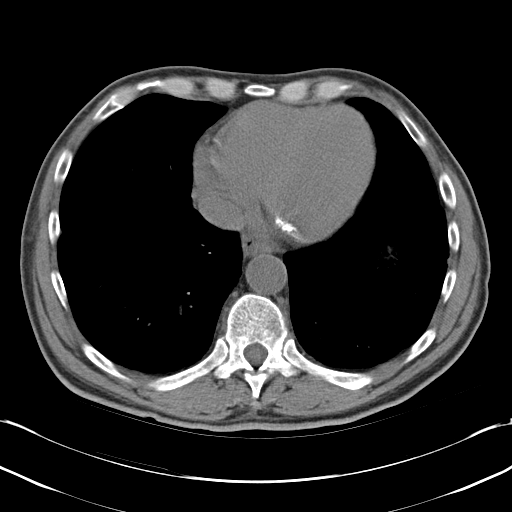

[Series 4: mpr coronal · coronal · 0.71mm/px · 3 of 78 slices shown]
[im 26/78  soft-tissue]
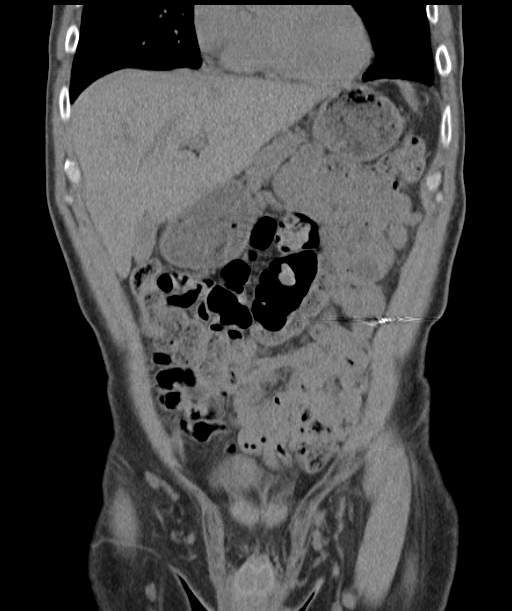
[im 35/78  soft-tissue]
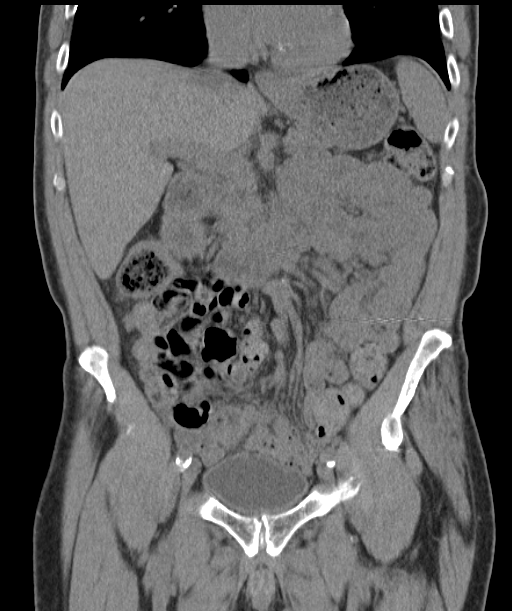
[im 43/78  soft-tissue]
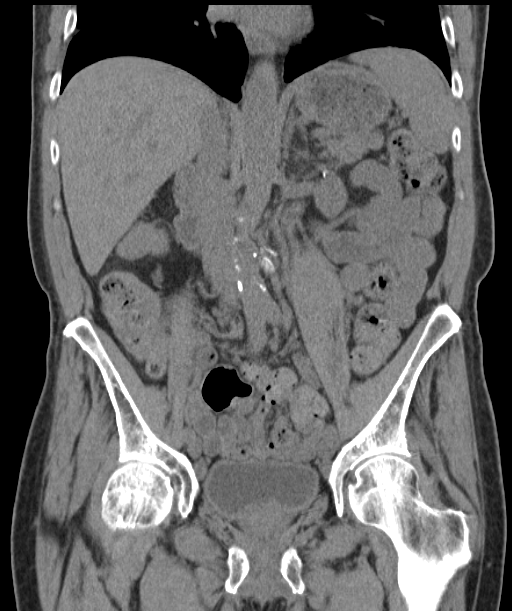

[17 of 46 positions shown; findings below may reference images not displayed]

FINDINGS: The lung bases are free of acute infiltrate or sizable
effusion.

The liver, gallbladder, spleen, adrenal glands and pancreas are
normal in their CT appearance.  The kidneys are well visualized
bilaterally and reveal a small nonobstructing renal stones in the
lower pole on the right.  Multiple stones are noted within the left
collecting system.  The largest of these lies in the lower pole and
measures 16.5 mm.  Bilateral renal cystic change is noted.  No
obstructive changes are seen.

The appendix is not well visualized although no inflammatory
changes to suggest appendicitis are noted.  The bladder is well
distended.  No pelvic mass lesion is seen.  No acute bony
abnormality is noted.
IMPRESSION: Bilateral renal stones left greater than right.  No complicating
factors are noted.

No other focal abnormality is seen.

## 2015-03-29 DIAGNOSIS — M24561 Contracture, right knee: Secondary | ICD-10-CM | POA: Diagnosis not present

## 2015-03-29 DIAGNOSIS — M6281 Muscle weakness (generalized): Secondary | ICD-10-CM | POA: Diagnosis not present

## 2015-03-29 DIAGNOSIS — R262 Difficulty in walking, not elsewhere classified: Secondary | ICD-10-CM | POA: Diagnosis not present

## 2015-03-29 DIAGNOSIS — R279 Unspecified lack of coordination: Secondary | ICD-10-CM | POA: Diagnosis not present

## 2015-03-30 DIAGNOSIS — M6281 Muscle weakness (generalized): Secondary | ICD-10-CM | POA: Diagnosis not present

## 2015-03-30 DIAGNOSIS — M24561 Contracture, right knee: Secondary | ICD-10-CM | POA: Diagnosis not present

## 2015-03-30 DIAGNOSIS — R279 Unspecified lack of coordination: Secondary | ICD-10-CM | POA: Diagnosis not present

## 2015-03-30 DIAGNOSIS — R262 Difficulty in walking, not elsewhere classified: Secondary | ICD-10-CM | POA: Diagnosis not present

## 2015-04-02 DIAGNOSIS — R262 Difficulty in walking, not elsewhere classified: Secondary | ICD-10-CM | POA: Diagnosis not present

## 2015-04-02 DIAGNOSIS — R279 Unspecified lack of coordination: Secondary | ICD-10-CM | POA: Diagnosis not present

## 2015-04-02 DIAGNOSIS — M24561 Contracture, right knee: Secondary | ICD-10-CM | POA: Diagnosis not present

## 2015-04-02 DIAGNOSIS — M6281 Muscle weakness (generalized): Secondary | ICD-10-CM | POA: Diagnosis not present

## 2015-04-03 DIAGNOSIS — M24561 Contracture, right knee: Secondary | ICD-10-CM | POA: Diagnosis not present

## 2015-04-03 DIAGNOSIS — R262 Difficulty in walking, not elsewhere classified: Secondary | ICD-10-CM | POA: Diagnosis not present

## 2015-04-03 DIAGNOSIS — M6281 Muscle weakness (generalized): Secondary | ICD-10-CM | POA: Diagnosis not present

## 2015-04-03 DIAGNOSIS — R279 Unspecified lack of coordination: Secondary | ICD-10-CM | POA: Diagnosis not present

## 2015-04-04 DIAGNOSIS — M6281 Muscle weakness (generalized): Secondary | ICD-10-CM | POA: Diagnosis not present

## 2015-04-04 DIAGNOSIS — R279 Unspecified lack of coordination: Secondary | ICD-10-CM | POA: Diagnosis not present

## 2015-04-04 DIAGNOSIS — R262 Difficulty in walking, not elsewhere classified: Secondary | ICD-10-CM | POA: Diagnosis not present

## 2015-04-04 DIAGNOSIS — M24561 Contracture, right knee: Secondary | ICD-10-CM | POA: Diagnosis not present

## 2015-04-05 DIAGNOSIS — M24561 Contracture, right knee: Secondary | ICD-10-CM | POA: Diagnosis not present

## 2015-04-05 DIAGNOSIS — R262 Difficulty in walking, not elsewhere classified: Secondary | ICD-10-CM | POA: Diagnosis not present

## 2015-04-05 DIAGNOSIS — M6281 Muscle weakness (generalized): Secondary | ICD-10-CM | POA: Diagnosis not present

## 2015-04-05 DIAGNOSIS — R279 Unspecified lack of coordination: Secondary | ICD-10-CM | POA: Diagnosis not present

## 2015-04-06 DIAGNOSIS — R279 Unspecified lack of coordination: Secondary | ICD-10-CM | POA: Diagnosis not present

## 2015-04-06 DIAGNOSIS — R262 Difficulty in walking, not elsewhere classified: Secondary | ICD-10-CM | POA: Diagnosis not present

## 2015-04-06 DIAGNOSIS — M6281 Muscle weakness (generalized): Secondary | ICD-10-CM | POA: Diagnosis not present

## 2015-04-06 DIAGNOSIS — M24561 Contracture, right knee: Secondary | ICD-10-CM | POA: Diagnosis not present

## 2015-04-09 DIAGNOSIS — M24561 Contracture, right knee: Secondary | ICD-10-CM | POA: Diagnosis not present

## 2015-04-09 DIAGNOSIS — M6281 Muscle weakness (generalized): Secondary | ICD-10-CM | POA: Diagnosis not present

## 2015-04-09 DIAGNOSIS — R279 Unspecified lack of coordination: Secondary | ICD-10-CM | POA: Diagnosis not present

## 2015-04-09 DIAGNOSIS — R262 Difficulty in walking, not elsewhere classified: Secondary | ICD-10-CM | POA: Diagnosis not present

## 2015-04-10 DIAGNOSIS — M6281 Muscle weakness (generalized): Secondary | ICD-10-CM | POA: Diagnosis not present

## 2015-04-10 DIAGNOSIS — R279 Unspecified lack of coordination: Secondary | ICD-10-CM | POA: Diagnosis not present

## 2015-04-10 DIAGNOSIS — R262 Difficulty in walking, not elsewhere classified: Secondary | ICD-10-CM | POA: Diagnosis not present

## 2015-04-10 DIAGNOSIS — M24561 Contracture, right knee: Secondary | ICD-10-CM | POA: Diagnosis not present

## 2015-04-11 DIAGNOSIS — R262 Difficulty in walking, not elsewhere classified: Secondary | ICD-10-CM | POA: Diagnosis not present

## 2015-04-11 DIAGNOSIS — M24561 Contracture, right knee: Secondary | ICD-10-CM | POA: Diagnosis not present

## 2015-04-11 DIAGNOSIS — R279 Unspecified lack of coordination: Secondary | ICD-10-CM | POA: Diagnosis not present

## 2015-04-11 DIAGNOSIS — M6281 Muscle weakness (generalized): Secondary | ICD-10-CM | POA: Diagnosis not present

## 2015-04-12 DIAGNOSIS — R279 Unspecified lack of coordination: Secondary | ICD-10-CM | POA: Diagnosis not present

## 2015-04-12 DIAGNOSIS — R262 Difficulty in walking, not elsewhere classified: Secondary | ICD-10-CM | POA: Diagnosis not present

## 2015-04-12 DIAGNOSIS — M24561 Contracture, right knee: Secondary | ICD-10-CM | POA: Diagnosis not present

## 2015-04-12 DIAGNOSIS — M6281 Muscle weakness (generalized): Secondary | ICD-10-CM | POA: Diagnosis not present

## 2015-04-13 DIAGNOSIS — F445 Conversion disorder with seizures or convulsions: Secondary | ICD-10-CM | POA: Diagnosis not present

## 2015-04-13 DIAGNOSIS — R740 Nonspecific elevation of levels of transaminase and lactic acid dehydrogenase [LDH]: Secondary | ICD-10-CM | POA: Diagnosis not present

## 2015-04-13 DIAGNOSIS — R279 Unspecified lack of coordination: Secondary | ICD-10-CM | POA: Diagnosis not present

## 2015-04-13 DIAGNOSIS — M6281 Muscle weakness (generalized): Secondary | ICD-10-CM | POA: Diagnosis not present

## 2015-04-13 DIAGNOSIS — I10 Essential (primary) hypertension: Secondary | ICD-10-CM | POA: Diagnosis not present

## 2015-04-13 DIAGNOSIS — R262 Difficulty in walking, not elsewhere classified: Secondary | ICD-10-CM | POA: Diagnosis not present

## 2015-04-13 DIAGNOSIS — M24561 Contracture, right knee: Secondary | ICD-10-CM | POA: Diagnosis not present

## 2015-04-13 DIAGNOSIS — E119 Type 2 diabetes mellitus without complications: Secondary | ICD-10-CM | POA: Diagnosis not present

## 2015-04-13 DIAGNOSIS — E785 Hyperlipidemia, unspecified: Secondary | ICD-10-CM | POA: Diagnosis not present

## 2015-04-13 DIAGNOSIS — R7301 Impaired fasting glucose: Secondary | ICD-10-CM | POA: Diagnosis not present

## 2015-04-16 DIAGNOSIS — R262 Difficulty in walking, not elsewhere classified: Secondary | ICD-10-CM | POA: Diagnosis not present

## 2015-04-16 DIAGNOSIS — M6281 Muscle weakness (generalized): Secondary | ICD-10-CM | POA: Diagnosis not present

## 2015-04-16 DIAGNOSIS — M24561 Contracture, right knee: Secondary | ICD-10-CM | POA: Diagnosis not present

## 2015-04-16 DIAGNOSIS — R279 Unspecified lack of coordination: Secondary | ICD-10-CM | POA: Diagnosis not present

## 2015-04-17 DIAGNOSIS — R279 Unspecified lack of coordination: Secondary | ICD-10-CM | POA: Diagnosis not present

## 2015-04-17 DIAGNOSIS — R262 Difficulty in walking, not elsewhere classified: Secondary | ICD-10-CM | POA: Diagnosis not present

## 2015-04-17 DIAGNOSIS — M24561 Contracture, right knee: Secondary | ICD-10-CM | POA: Diagnosis not present

## 2015-04-17 DIAGNOSIS — F419 Anxiety disorder, unspecified: Secondary | ICD-10-CM | POA: Diagnosis not present

## 2015-04-17 DIAGNOSIS — F329 Major depressive disorder, single episode, unspecified: Secondary | ICD-10-CM | POA: Diagnosis not present

## 2015-04-17 DIAGNOSIS — M6281 Muscle weakness (generalized): Secondary | ICD-10-CM | POA: Diagnosis not present

## 2015-04-17 DIAGNOSIS — F29 Unspecified psychosis not due to a substance or known physiological condition: Secondary | ICD-10-CM | POA: Diagnosis not present

## 2015-04-17 DIAGNOSIS — F0390 Unspecified dementia without behavioral disturbance: Secondary | ICD-10-CM | POA: Diagnosis not present

## 2015-04-18 DIAGNOSIS — M6281 Muscle weakness (generalized): Secondary | ICD-10-CM | POA: Diagnosis not present

## 2015-04-18 DIAGNOSIS — M24561 Contracture, right knee: Secondary | ICD-10-CM | POA: Diagnosis not present

## 2015-04-18 DIAGNOSIS — R262 Difficulty in walking, not elsewhere classified: Secondary | ICD-10-CM | POA: Diagnosis not present

## 2015-04-18 DIAGNOSIS — R279 Unspecified lack of coordination: Secondary | ICD-10-CM | POA: Diagnosis not present

## 2015-04-19 DIAGNOSIS — R262 Difficulty in walking, not elsewhere classified: Secondary | ICD-10-CM | POA: Diagnosis not present

## 2015-04-19 DIAGNOSIS — M6281 Muscle weakness (generalized): Secondary | ICD-10-CM | POA: Diagnosis not present

## 2015-04-19 DIAGNOSIS — R279 Unspecified lack of coordination: Secondary | ICD-10-CM | POA: Diagnosis not present

## 2015-04-19 DIAGNOSIS — M24561 Contracture, right knee: Secondary | ICD-10-CM | POA: Diagnosis not present

## 2015-04-20 DIAGNOSIS — R279 Unspecified lack of coordination: Secondary | ICD-10-CM | POA: Diagnosis not present

## 2015-04-20 DIAGNOSIS — R262 Difficulty in walking, not elsewhere classified: Secondary | ICD-10-CM | POA: Diagnosis not present

## 2015-04-20 DIAGNOSIS — M6281 Muscle weakness (generalized): Secondary | ICD-10-CM | POA: Diagnosis not present

## 2015-04-20 DIAGNOSIS — M24561 Contracture, right knee: Secondary | ICD-10-CM | POA: Diagnosis not present

## 2015-04-22 DIAGNOSIS — R262 Difficulty in walking, not elsewhere classified: Secondary | ICD-10-CM | POA: Diagnosis not present

## 2015-04-22 DIAGNOSIS — M6281 Muscle weakness (generalized): Secondary | ICD-10-CM | POA: Diagnosis not present

## 2015-04-22 DIAGNOSIS — R279 Unspecified lack of coordination: Secondary | ICD-10-CM | POA: Diagnosis not present

## 2015-04-22 DIAGNOSIS — M24561 Contracture, right knee: Secondary | ICD-10-CM | POA: Diagnosis not present

## 2015-04-25 DIAGNOSIS — R279 Unspecified lack of coordination: Secondary | ICD-10-CM | POA: Diagnosis not present

## 2015-04-25 DIAGNOSIS — M6281 Muscle weakness (generalized): Secondary | ICD-10-CM | POA: Diagnosis not present

## 2015-04-25 DIAGNOSIS — M24561 Contracture, right knee: Secondary | ICD-10-CM | POA: Diagnosis not present

## 2015-04-25 DIAGNOSIS — R262 Difficulty in walking, not elsewhere classified: Secondary | ICD-10-CM | POA: Diagnosis not present

## 2015-04-26 DIAGNOSIS — M24561 Contracture, right knee: Secondary | ICD-10-CM | POA: Diagnosis not present

## 2015-04-26 DIAGNOSIS — R262 Difficulty in walking, not elsewhere classified: Secondary | ICD-10-CM | POA: Diagnosis not present

## 2015-04-26 DIAGNOSIS — R279 Unspecified lack of coordination: Secondary | ICD-10-CM | POA: Diagnosis not present

## 2015-04-26 DIAGNOSIS — M6281 Muscle weakness (generalized): Secondary | ICD-10-CM | POA: Diagnosis not present

## 2015-04-27 DIAGNOSIS — R262 Difficulty in walking, not elsewhere classified: Secondary | ICD-10-CM | POA: Diagnosis not present

## 2015-04-27 DIAGNOSIS — M6281 Muscle weakness (generalized): Secondary | ICD-10-CM | POA: Diagnosis not present

## 2015-04-27 DIAGNOSIS — R279 Unspecified lack of coordination: Secondary | ICD-10-CM | POA: Diagnosis not present

## 2015-04-27 DIAGNOSIS — M24561 Contracture, right knee: Secondary | ICD-10-CM | POA: Diagnosis not present

## 2015-04-28 DIAGNOSIS — R262 Difficulty in walking, not elsewhere classified: Secondary | ICD-10-CM | POA: Diagnosis not present

## 2015-04-28 DIAGNOSIS — R279 Unspecified lack of coordination: Secondary | ICD-10-CM | POA: Diagnosis not present

## 2015-04-28 DIAGNOSIS — M6281 Muscle weakness (generalized): Secondary | ICD-10-CM | POA: Diagnosis not present

## 2015-04-28 DIAGNOSIS — M24561 Contracture, right knee: Secondary | ICD-10-CM | POA: Diagnosis not present

## 2015-04-30 DIAGNOSIS — M24561 Contracture, right knee: Secondary | ICD-10-CM | POA: Diagnosis not present

## 2015-04-30 DIAGNOSIS — M6281 Muscle weakness (generalized): Secondary | ICD-10-CM | POA: Diagnosis not present

## 2015-04-30 DIAGNOSIS — R262 Difficulty in walking, not elsewhere classified: Secondary | ICD-10-CM | POA: Diagnosis not present

## 2015-04-30 DIAGNOSIS — R279 Unspecified lack of coordination: Secondary | ICD-10-CM | POA: Diagnosis not present

## 2015-05-01 DIAGNOSIS — M24561 Contracture, right knee: Secondary | ICD-10-CM | POA: Diagnosis not present

## 2015-05-01 DIAGNOSIS — R279 Unspecified lack of coordination: Secondary | ICD-10-CM | POA: Diagnosis not present

## 2015-05-01 DIAGNOSIS — M6281 Muscle weakness (generalized): Secondary | ICD-10-CM | POA: Diagnosis not present

## 2015-05-01 DIAGNOSIS — R262 Difficulty in walking, not elsewhere classified: Secondary | ICD-10-CM | POA: Diagnosis not present

## 2015-05-02 DIAGNOSIS — M24561 Contracture, right knee: Secondary | ICD-10-CM | POA: Diagnosis not present

## 2015-05-02 DIAGNOSIS — R262 Difficulty in walking, not elsewhere classified: Secondary | ICD-10-CM | POA: Diagnosis not present

## 2015-05-02 DIAGNOSIS — R279 Unspecified lack of coordination: Secondary | ICD-10-CM | POA: Diagnosis not present

## 2015-05-02 DIAGNOSIS — M6281 Muscle weakness (generalized): Secondary | ICD-10-CM | POA: Diagnosis not present

## 2015-05-03 DIAGNOSIS — M6281 Muscle weakness (generalized): Secondary | ICD-10-CM | POA: Diagnosis not present

## 2015-05-03 DIAGNOSIS — R262 Difficulty in walking, not elsewhere classified: Secondary | ICD-10-CM | POA: Diagnosis not present

## 2015-05-03 DIAGNOSIS — M24561 Contracture, right knee: Secondary | ICD-10-CM | POA: Diagnosis not present

## 2015-05-03 DIAGNOSIS — R279 Unspecified lack of coordination: Secondary | ICD-10-CM | POA: Diagnosis not present

## 2015-05-04 DIAGNOSIS — R279 Unspecified lack of coordination: Secondary | ICD-10-CM | POA: Diagnosis not present

## 2015-05-04 DIAGNOSIS — R262 Difficulty in walking, not elsewhere classified: Secondary | ICD-10-CM | POA: Diagnosis not present

## 2015-05-04 DIAGNOSIS — M6281 Muscle weakness (generalized): Secondary | ICD-10-CM | POA: Diagnosis not present

## 2015-05-04 DIAGNOSIS — M24561 Contracture, right knee: Secondary | ICD-10-CM | POA: Diagnosis not present

## 2015-05-07 DIAGNOSIS — R279 Unspecified lack of coordination: Secondary | ICD-10-CM | POA: Diagnosis not present

## 2015-05-07 DIAGNOSIS — R262 Difficulty in walking, not elsewhere classified: Secondary | ICD-10-CM | POA: Diagnosis not present

## 2015-05-07 DIAGNOSIS — M24561 Contracture, right knee: Secondary | ICD-10-CM | POA: Diagnosis not present

## 2015-05-07 DIAGNOSIS — M6281 Muscle weakness (generalized): Secondary | ICD-10-CM | POA: Diagnosis not present

## 2015-05-08 DIAGNOSIS — R279 Unspecified lack of coordination: Secondary | ICD-10-CM | POA: Diagnosis not present

## 2015-05-08 DIAGNOSIS — M24561 Contracture, right knee: Secondary | ICD-10-CM | POA: Diagnosis not present

## 2015-05-08 DIAGNOSIS — R262 Difficulty in walking, not elsewhere classified: Secondary | ICD-10-CM | POA: Diagnosis not present

## 2015-05-08 DIAGNOSIS — M6281 Muscle weakness (generalized): Secondary | ICD-10-CM | POA: Diagnosis not present

## 2015-05-09 DIAGNOSIS — M6281 Muscle weakness (generalized): Secondary | ICD-10-CM | POA: Diagnosis not present

## 2015-05-09 DIAGNOSIS — R262 Difficulty in walking, not elsewhere classified: Secondary | ICD-10-CM | POA: Diagnosis not present

## 2015-05-09 DIAGNOSIS — M24561 Contracture, right knee: Secondary | ICD-10-CM | POA: Diagnosis not present

## 2015-05-09 DIAGNOSIS — R279 Unspecified lack of coordination: Secondary | ICD-10-CM | POA: Diagnosis not present

## 2015-05-10 DIAGNOSIS — M24561 Contracture, right knee: Secondary | ICD-10-CM | POA: Diagnosis not present

## 2015-05-10 DIAGNOSIS — R262 Difficulty in walking, not elsewhere classified: Secondary | ICD-10-CM | POA: Diagnosis not present

## 2015-05-10 DIAGNOSIS — M6281 Muscle weakness (generalized): Secondary | ICD-10-CM | POA: Diagnosis not present

## 2015-05-10 DIAGNOSIS — R279 Unspecified lack of coordination: Secondary | ICD-10-CM | POA: Diagnosis not present

## 2015-05-11 DIAGNOSIS — M24561 Contracture, right knee: Secondary | ICD-10-CM | POA: Diagnosis not present

## 2015-05-11 DIAGNOSIS — M6281 Muscle weakness (generalized): Secondary | ICD-10-CM | POA: Diagnosis not present

## 2015-05-11 DIAGNOSIS — R279 Unspecified lack of coordination: Secondary | ICD-10-CM | POA: Diagnosis not present

## 2015-05-11 DIAGNOSIS — R262 Difficulty in walking, not elsewhere classified: Secondary | ICD-10-CM | POA: Diagnosis not present

## 2015-05-14 DIAGNOSIS — R262 Difficulty in walking, not elsewhere classified: Secondary | ICD-10-CM | POA: Diagnosis not present

## 2015-05-14 DIAGNOSIS — M6281 Muscle weakness (generalized): Secondary | ICD-10-CM | POA: Diagnosis not present

## 2015-05-14 DIAGNOSIS — R279 Unspecified lack of coordination: Secondary | ICD-10-CM | POA: Diagnosis not present

## 2015-05-14 DIAGNOSIS — M24561 Contracture, right knee: Secondary | ICD-10-CM | POA: Diagnosis not present

## 2015-05-15 DIAGNOSIS — I7389 Other specified peripheral vascular diseases: Secondary | ICD-10-CM | POA: Diagnosis not present

## 2015-05-15 DIAGNOSIS — M24561 Contracture, right knee: Secondary | ICD-10-CM | POA: Diagnosis not present

## 2015-05-15 DIAGNOSIS — E119 Type 2 diabetes mellitus without complications: Secondary | ICD-10-CM | POA: Diagnosis not present

## 2015-05-15 DIAGNOSIS — R262 Difficulty in walking, not elsewhere classified: Secondary | ICD-10-CM | POA: Diagnosis not present

## 2015-05-15 DIAGNOSIS — I10 Essential (primary) hypertension: Secondary | ICD-10-CM | POA: Diagnosis not present

## 2015-05-15 DIAGNOSIS — R279 Unspecified lack of coordination: Secondary | ICD-10-CM | POA: Diagnosis not present

## 2015-05-15 DIAGNOSIS — M6281 Muscle weakness (generalized): Secondary | ICD-10-CM | POA: Diagnosis not present

## 2015-05-16 DIAGNOSIS — M24561 Contracture, right knee: Secondary | ICD-10-CM | POA: Diagnosis not present

## 2015-05-16 DIAGNOSIS — R262 Difficulty in walking, not elsewhere classified: Secondary | ICD-10-CM | POA: Diagnosis not present

## 2015-05-16 DIAGNOSIS — R279 Unspecified lack of coordination: Secondary | ICD-10-CM | POA: Diagnosis not present

## 2015-05-16 DIAGNOSIS — M6281 Muscle weakness (generalized): Secondary | ICD-10-CM | POA: Diagnosis not present

## 2015-05-17 DIAGNOSIS — M24561 Contracture, right knee: Secondary | ICD-10-CM | POA: Diagnosis not present

## 2015-05-17 DIAGNOSIS — R262 Difficulty in walking, not elsewhere classified: Secondary | ICD-10-CM | POA: Diagnosis not present

## 2015-05-17 DIAGNOSIS — R279 Unspecified lack of coordination: Secondary | ICD-10-CM | POA: Diagnosis not present

## 2015-05-17 DIAGNOSIS — M6281 Muscle weakness (generalized): Secondary | ICD-10-CM | POA: Diagnosis not present

## 2015-05-18 DIAGNOSIS — M24561 Contracture, right knee: Secondary | ICD-10-CM | POA: Diagnosis not present

## 2015-05-18 DIAGNOSIS — M6281 Muscle weakness (generalized): Secondary | ICD-10-CM | POA: Diagnosis not present

## 2015-05-18 DIAGNOSIS — R262 Difficulty in walking, not elsewhere classified: Secondary | ICD-10-CM | POA: Diagnosis not present

## 2015-05-18 DIAGNOSIS — R279 Unspecified lack of coordination: Secondary | ICD-10-CM | POA: Diagnosis not present

## 2015-05-21 DIAGNOSIS — M24561 Contracture, right knee: Secondary | ICD-10-CM | POA: Diagnosis not present

## 2015-05-21 DIAGNOSIS — R279 Unspecified lack of coordination: Secondary | ICD-10-CM | POA: Diagnosis not present

## 2015-05-21 DIAGNOSIS — M6281 Muscle weakness (generalized): Secondary | ICD-10-CM | POA: Diagnosis not present

## 2015-05-21 DIAGNOSIS — R262 Difficulty in walking, not elsewhere classified: Secondary | ICD-10-CM | POA: Diagnosis not present

## 2015-05-22 DIAGNOSIS — R262 Difficulty in walking, not elsewhere classified: Secondary | ICD-10-CM | POA: Diagnosis not present

## 2015-05-22 DIAGNOSIS — M6281 Muscle weakness (generalized): Secondary | ICD-10-CM | POA: Diagnosis not present

## 2015-05-22 DIAGNOSIS — R279 Unspecified lack of coordination: Secondary | ICD-10-CM | POA: Diagnosis not present

## 2015-05-22 DIAGNOSIS — M24561 Contracture, right knee: Secondary | ICD-10-CM | POA: Diagnosis not present

## 2015-05-23 DIAGNOSIS — M24561 Contracture, right knee: Secondary | ICD-10-CM | POA: Diagnosis not present

## 2015-05-23 DIAGNOSIS — M6281 Muscle weakness (generalized): Secondary | ICD-10-CM | POA: Diagnosis not present

## 2015-05-23 DIAGNOSIS — R279 Unspecified lack of coordination: Secondary | ICD-10-CM | POA: Diagnosis not present

## 2015-05-23 DIAGNOSIS — R262 Difficulty in walking, not elsewhere classified: Secondary | ICD-10-CM | POA: Diagnosis not present

## 2015-05-24 DIAGNOSIS — M6281 Muscle weakness (generalized): Secondary | ICD-10-CM | POA: Diagnosis not present

## 2015-05-24 DIAGNOSIS — M24561 Contracture, right knee: Secondary | ICD-10-CM | POA: Diagnosis not present

## 2015-05-24 DIAGNOSIS — R279 Unspecified lack of coordination: Secondary | ICD-10-CM | POA: Diagnosis not present

## 2015-05-24 DIAGNOSIS — R262 Difficulty in walking, not elsewhere classified: Secondary | ICD-10-CM | POA: Diagnosis not present

## 2015-05-25 DIAGNOSIS — M6281 Muscle weakness (generalized): Secondary | ICD-10-CM | POA: Diagnosis not present

## 2015-05-25 DIAGNOSIS — R279 Unspecified lack of coordination: Secondary | ICD-10-CM | POA: Diagnosis not present

## 2015-05-25 DIAGNOSIS — R262 Difficulty in walking, not elsewhere classified: Secondary | ICD-10-CM | POA: Diagnosis not present

## 2015-05-25 DIAGNOSIS — M24561 Contracture, right knee: Secondary | ICD-10-CM | POA: Diagnosis not present

## 2015-05-27 DIAGNOSIS — R262 Difficulty in walking, not elsewhere classified: Secondary | ICD-10-CM | POA: Diagnosis not present

## 2015-05-27 DIAGNOSIS — M24561 Contracture, right knee: Secondary | ICD-10-CM | POA: Diagnosis not present

## 2015-05-27 DIAGNOSIS — R279 Unspecified lack of coordination: Secondary | ICD-10-CM | POA: Diagnosis not present

## 2015-05-27 DIAGNOSIS — M6281 Muscle weakness (generalized): Secondary | ICD-10-CM | POA: Diagnosis not present

## 2015-05-28 DIAGNOSIS — R279 Unspecified lack of coordination: Secondary | ICD-10-CM | POA: Diagnosis not present

## 2015-05-28 DIAGNOSIS — R262 Difficulty in walking, not elsewhere classified: Secondary | ICD-10-CM | POA: Diagnosis not present

## 2015-05-28 DIAGNOSIS — M6281 Muscle weakness (generalized): Secondary | ICD-10-CM | POA: Diagnosis not present

## 2015-05-28 DIAGNOSIS — M24561 Contracture, right knee: Secondary | ICD-10-CM | POA: Diagnosis not present

## 2015-05-29 DIAGNOSIS — M6281 Muscle weakness (generalized): Secondary | ICD-10-CM | POA: Diagnosis not present

## 2015-05-29 DIAGNOSIS — R262 Difficulty in walking, not elsewhere classified: Secondary | ICD-10-CM | POA: Diagnosis not present

## 2015-05-29 DIAGNOSIS — M24561 Contracture, right knee: Secondary | ICD-10-CM | POA: Diagnosis not present

## 2015-05-29 DIAGNOSIS — R279 Unspecified lack of coordination: Secondary | ICD-10-CM | POA: Diagnosis not present

## 2015-05-30 DIAGNOSIS — R279 Unspecified lack of coordination: Secondary | ICD-10-CM | POA: Diagnosis not present

## 2015-05-30 DIAGNOSIS — R262 Difficulty in walking, not elsewhere classified: Secondary | ICD-10-CM | POA: Diagnosis not present

## 2015-05-30 DIAGNOSIS — M6281 Muscle weakness (generalized): Secondary | ICD-10-CM | POA: Diagnosis not present

## 2015-05-30 DIAGNOSIS — M24561 Contracture, right knee: Secondary | ICD-10-CM | POA: Diagnosis not present

## 2015-06-27 ENCOUNTER — Ambulatory Visit: Payer: Medicare Other | Admitting: Vascular Surgery

## 2015-06-27 ENCOUNTER — Encounter (HOSPITAL_COMMUNITY): Payer: Medicare Other

## 2015-06-28 DIAGNOSIS — I10 Essential (primary) hypertension: Secondary | ICD-10-CM | POA: Diagnosis not present

## 2015-06-28 DIAGNOSIS — I7389 Other specified peripheral vascular diseases: Secondary | ICD-10-CM | POA: Diagnosis not present

## 2015-06-28 DIAGNOSIS — E119 Type 2 diabetes mellitus without complications: Secondary | ICD-10-CM | POA: Diagnosis not present

## 2015-07-06 DIAGNOSIS — E119 Type 2 diabetes mellitus without complications: Secondary | ICD-10-CM | POA: Diagnosis not present

## 2015-07-11 DIAGNOSIS — F329 Major depressive disorder, single episode, unspecified: Secondary | ICD-10-CM | POA: Diagnosis not present

## 2015-07-11 DIAGNOSIS — F0390 Unspecified dementia without behavioral disturbance: Secondary | ICD-10-CM | POA: Diagnosis not present

## 2015-07-11 DIAGNOSIS — F419 Anxiety disorder, unspecified: Secondary | ICD-10-CM | POA: Diagnosis not present

## 2015-07-11 DIAGNOSIS — F29 Unspecified psychosis not due to a substance or known physiological condition: Secondary | ICD-10-CM | POA: Diagnosis not present

## 2015-07-20 ENCOUNTER — Encounter: Payer: Self-pay | Admitting: Vascular Surgery

## 2015-07-25 ENCOUNTER — Ambulatory Visit (INDEPENDENT_AMBULATORY_CARE_PROVIDER_SITE_OTHER): Payer: Medicare Other | Admitting: Vascular Surgery

## 2015-07-25 ENCOUNTER — Ambulatory Visit (HOSPITAL_COMMUNITY)
Admission: RE | Admit: 2015-07-25 | Discharge: 2015-07-25 | Disposition: A | Discharge status: Home / Self Care | Payer: Medicare Other | Source: Ambulatory Visit | Attending: Vascular Surgery | Admitting: Vascular Surgery

## 2015-07-25 ENCOUNTER — Encounter: Payer: Self-pay | Admitting: Vascular Surgery

## 2015-07-25 VITALS — BP 148/70 | HR 63 | Temp 99.2°F | Resp 16 | Ht 73.0 in | Wt 175.0 lb

## 2015-07-25 DIAGNOSIS — I10 Essential (primary) hypertension: Secondary | ICD-10-CM | POA: Diagnosis not present

## 2015-07-25 DIAGNOSIS — I7025 Atherosclerosis of native arteries of other extremities with ulceration: Secondary | ICD-10-CM | POA: Diagnosis not present

## 2015-07-25 DIAGNOSIS — E119 Type 2 diabetes mellitus without complications: Secondary | ICD-10-CM | POA: Insufficient documentation

## 2015-07-25 DIAGNOSIS — I70209 Unspecified atherosclerosis of native arteries of extremities, unspecified extremity: Secondary | ICD-10-CM

## 2015-07-25 DIAGNOSIS — E7800 Pure hypercholesterolemia, unspecified: Secondary | ICD-10-CM | POA: Diagnosis not present

## 2015-07-25 DIAGNOSIS — R0989 Other specified symptoms and signs involving the circulatory and respiratory systems: Secondary | ICD-10-CM | POA: Diagnosis present

## 2015-07-25 NOTE — Progress Notes (Signed)
Filed Vitals:   07/25/15 0833 07/25/15 0835  BP: 173/70 148/70  Pulse: 64 63  Temp: 99.2 F (37.3 C)   Resp: 16   Height: 6\' 1"  (1.854 m)   Weight: 175 lb (79.379 kg)   SpO2: 96%

## 2015-07-25 NOTE — Progress Notes (Signed)
[facility]  Patient name: Terry Green MRN: [id number] DOB: 1940/11/06 Sex: male  REASON FOR VISIT: Follow up of left foot wounds.  HPI: Terry Green is a 75 y.o. male who I last saw on 01/31/2015. He had presented with gangrene of the right foot. He had undergone attempted revascularization elsewhere which was unsuccessful and he had no further options for revascularization. 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 Previously I had seen him in October of last year with a superficial ulcer on his left medial malleolus and a wound on his left heel. Based on previous toe pressures I felt that he had adequate circulation for healing. Set him up for 6 month follow up visit. At the time of his last visit he did not have any open wounds on his left foot.  He denies any history of rest pain. He is ambulatory with his prosthesis. His activity is limited so there is really no history of claudication. He is not a smoker. He denies any open wounds on his left foot.  Past Medical History  Diagnosis Date  . Hypertension   . Hypercholesteremia   . Peripheral vascular disease (HCC)   . Bipolar disorder (HCC)   . Type II diabetes mellitus (HCC)   . Arthritis     "hands" (06/06/2014)  . Rheumatoid arthritis (HCC)   . PAD (peripheral artery disease) (HCC)     Family History  Problem Relation Age of Onset  . Heart disease Father     SOCIAL HISTORY: Social History  Substance Use Topics  . Smoking status: Former Smoker -- 1.00 packs/day for 54 years    Types: Cigarettes    Quit date: 05/25/2012  . Smokeless tobacco: Never Used  . Alcohol Use: 0.0 oz/week    0 Standard drinks or equivalent per week     Comment: 06/06/2014 "I drank 1/2 pint whiskey each day;  nothing in 20 yrs"    Allergies  Allergen Reactions  . Lisinopril Swelling    Current Outpatient Prescriptions  Medication Sig Dispense Refill  . amLODipine (NORVASC) 10 MG tablet  Take 10 mg by mouth at bedtime.     Marland Kitchen aspirin 81 MG chewable tablet Chew 81 mg by mouth daily.    . busPIRone (BUSPAR) 15 MG tablet Take 15 mg by mouth at bedtime.    . carvedilol (COREG) 12.5 MG tablet Take 12.5 mg by mouth 2 (two) times daily with a meal.    . clopidogrel (PLAVIX) 75 MG tablet Take 75 mg by mouth daily.    . divalproex (DEPAKOTE ER) 250 MG 24 hr tablet Take 250 mg by mouth 2 (two) times daily.    Marland Kitchen gabapentin (NEURONTIN) 300 MG capsule Take 300 mg by mouth 3 (three) times daily.    Marland Kitchen HYDROcodone-acetaminophen (NORCO) 7.5-325 MG per tablet Take 1 tablet by mouth every 4 (four) hours as needed for moderate pain. 30 tablet 0  . insulin aspart (NOVOLOG) 100 UNIT/ML FlexPen Inject 0-20 Units into the skin 2 (two) times daily. 6:30am and 4pm per sliding scale:  CBG 150-249 3 units, 250-349 5 units, 350-449 8 units, 450-549 14 units, 550-551 20 units >551 call MD    . Insulin Glargine (LANTUS) 100 UNIT/ML Solostar Pen Inject 10 Units into the skin at bedtime.     Marland Kitchen loratadine (CLARITIN) 10 MG tablet Take 10 mg by mouth daily.    . memantine (NAMENDA) 10 MG tablet Take 10 mg by  mouth 2 (two) times daily.    . metFORMIN (GLUCOPHAGE) 1000 MG tablet Take 1,000 mg by mouth daily.    Marland Kitchen oxybutynin (DITROPAN-XL) 10 MG 24 hr tablet Take 10 mg by mouth at bedtime.    . risperiDONE (RISPERDAL) 0.25 MG tablet Take 0.5 mg by mouth at bedtime.     . sertraline (ZOLOFT) 100 MG tablet Take 100 mg by mouth daily.    . tamsulosin (FLOMAX) 0.4 MG CAPS capsule Take 0.4 mg by mouth at bedtime.      No current facility-administered medications for this visit.    REVIEW OF SYSTEMS:  [X]  denotes positive finding, [ ]  denotes negative finding Cardiac  Comments:  Chest pain or chest pressure:    Shortness of breath upon exertion:    Short of breath when lying flat:    Irregular heart rhythm:        Vascular    Pain in calf, thigh, or hip brought on by ambulation:    Pain in feet at night that wakes  you up from your sleep:     Blood clot in your veins:    Leg swelling:         Pulmonary    Oxygen at home:    Productive cough:     Wheezing:         Neurologic    Sudden weakness in arms or legs:     Sudden numbness in arms or legs:     Sudden onset of difficulty speaking or slurred speech:    Temporary loss of vision in one eye:     Problems with dizziness:         Gastrointestinal    Blood in stool:     Vomited blood:         Genitourinary    Burning when urinating:     Blood in urine:        Psychiatric    Major depression:         Hematologic    Bleeding problems:    Problems with blood clotting too easily:        Skin    Rashes or ulcers:        Constitutional    Fever or chills:      PHYSICAL EXAM: Filed Vitals:   07/25/15 0833 07/25/15 0835  BP: 173/70 148/70  Pulse: 64 63  Temp: 99.2 F (37.3 C)   Resp: 16   Height: 6\' 1"  (1.854 m)   Weight: 175 lb (79.379 kg)   SpO2: 96%     GENERAL: The patient is a well-nourished male, in no acute distress. The vital signs are documented above. CARDIAC: There is a regular rate and rhythm.  VASCULAR: He has a palpable left femoral pulse. I cannot palpate a popliteal or pedal pulses on the left. PULMONARY: There is good air exchange bilaterally without wheezing or rales. ABDOMEN: Soft and non-tender with normal pitched bowel sounds.  MUSCULOSKELETAL: He has a right below the knee amputation. He also has a left second toe amputation which is healed. NEUROLOGIC: No focal weakness or paresthesias are detected. SKIN: there is some slight discoloration on his left heel measuring approximate a centimeter in diameter. There is no open wound. There is a small eschar on the dorsum of his right foot. PSYCHIATRIC: The patient has a normal affect.  DATA:   ARTERIAL DOPPLER: I haven't apparently interpreted his arterial Doppler of the left lower extremity. He has monophasic Doppler signals in  the left posterior tibial and  dorsalis pedis positions with an ABI of 68% on the left. A toe pressure could not be obtained as there were no discernible Doppler waveforms. His ABI was stable compared to his most recent exam.  MEDICAL ISSUES:  PERIPHERAL VASCULAR DISEASE: Based on his exam he has evidence of moderate infrainguinal arterial occlusive disease on the left. Currently he has no open ulcers. I have encouraged him to stay as active as possible. Fortunately he is not a smoker. I'll see him back in 6 months. He is to call sooner if he has problems.  HYPERTENSION: The patient's initial blood pressure today was elevated. We repeated this and this was still elevated. We have encouraged the patient to follow up with their primary care physician for management of their blood pressure.    [name] [facility] Beeper: [phone]

## 2015-07-30 DIAGNOSIS — E119 Type 2 diabetes mellitus without complications: Secondary | ICD-10-CM | POA: Diagnosis not present

## 2015-08-17 DIAGNOSIS — I7389 Other specified peripheral vascular diseases: Secondary | ICD-10-CM | POA: Diagnosis not present

## 2015-08-17 DIAGNOSIS — E119 Type 2 diabetes mellitus without complications: Secondary | ICD-10-CM | POA: Diagnosis not present

## 2015-08-17 DIAGNOSIS — I10 Essential (primary) hypertension: Secondary | ICD-10-CM | POA: Diagnosis not present

## 2015-08-21 DIAGNOSIS — E1051 Type 1 diabetes mellitus with diabetic peripheral angiopathy without gangrene: Secondary | ICD-10-CM | POA: Diagnosis not present

## 2015-09-20 DIAGNOSIS — F0390 Unspecified dementia without behavioral disturbance: Secondary | ICD-10-CM | POA: Diagnosis not present

## 2015-09-20 DIAGNOSIS — F419 Anxiety disorder, unspecified: Secondary | ICD-10-CM | POA: Diagnosis not present

## 2015-09-20 DIAGNOSIS — F329 Major depressive disorder, single episode, unspecified: Secondary | ICD-10-CM | POA: Diagnosis not present

## 2015-09-20 DIAGNOSIS — F29 Unspecified psychosis not due to a substance or known physiological condition: Secondary | ICD-10-CM | POA: Diagnosis not present

## 2015-09-28 DIAGNOSIS — F445 Conversion disorder with seizures or convulsions: Secondary | ICD-10-CM | POA: Diagnosis not present

## 2015-09-28 DIAGNOSIS — I10 Essential (primary) hypertension: Secondary | ICD-10-CM | POA: Diagnosis not present

## 2015-09-28 DIAGNOSIS — E119 Type 2 diabetes mellitus without complications: Secondary | ICD-10-CM | POA: Diagnosis not present

## 2015-10-01 DIAGNOSIS — E119 Type 2 diabetes mellitus without complications: Secondary | ICD-10-CM | POA: Diagnosis not present

## 2015-10-01 DIAGNOSIS — I7389 Other specified peripheral vascular diseases: Secondary | ICD-10-CM | POA: Diagnosis not present

## 2015-10-01 DIAGNOSIS — I10 Essential (primary) hypertension: Secondary | ICD-10-CM | POA: Diagnosis not present

## 2015-11-15 DIAGNOSIS — E119 Type 2 diabetes mellitus without complications: Secondary | ICD-10-CM | POA: Diagnosis not present

## 2015-11-15 DIAGNOSIS — I7389 Other specified peripheral vascular diseases: Secondary | ICD-10-CM | POA: Diagnosis not present

## 2015-11-15 DIAGNOSIS — I10 Essential (primary) hypertension: Secondary | ICD-10-CM | POA: Diagnosis not present

## 2015-12-12 DIAGNOSIS — R278 Other lack of coordination: Secondary | ICD-10-CM | POA: Diagnosis not present

## 2015-12-12 DIAGNOSIS — R262 Difficulty in walking, not elsewhere classified: Secondary | ICD-10-CM | POA: Diagnosis not present

## 2015-12-12 DIAGNOSIS — M24561 Contracture, right knee: Secondary | ICD-10-CM | POA: Diagnosis not present

## 2015-12-12 DIAGNOSIS — R279 Unspecified lack of coordination: Secondary | ICD-10-CM | POA: Diagnosis not present

## 2015-12-12 DIAGNOSIS — M6281 Muscle weakness (generalized): Secondary | ICD-10-CM | POA: Diagnosis not present

## 2015-12-12 DIAGNOSIS — R2689 Other abnormalities of gait and mobility: Secondary | ICD-10-CM | POA: Diagnosis not present

## 2015-12-13 ENCOUNTER — Telehealth: Payer: Self-pay | Admitting: *Deleted

## 2015-12-13 DIAGNOSIS — R278 Other lack of coordination: Secondary | ICD-10-CM | POA: Diagnosis not present

## 2015-12-13 DIAGNOSIS — I70245 Atherosclerosis of native arteries of left leg with ulceration of other part of foot: Secondary | ICD-10-CM

## 2015-12-13 DIAGNOSIS — M24561 Contracture, right knee: Secondary | ICD-10-CM | POA: Diagnosis not present

## 2015-12-13 DIAGNOSIS — R262 Difficulty in walking, not elsewhere classified: Secondary | ICD-10-CM | POA: Diagnosis not present

## 2015-12-13 DIAGNOSIS — M6281 Muscle weakness (generalized): Secondary | ICD-10-CM | POA: Diagnosis not present

## 2015-12-13 DIAGNOSIS — R279 Unspecified lack of coordination: Secondary | ICD-10-CM | POA: Diagnosis not present

## 2015-12-13 DIAGNOSIS — R2689 Other abnormalities of gait and mobility: Secondary | ICD-10-CM | POA: Diagnosis not present

## 2015-12-13 NOTE — Telephone Encounter (Signed)
Kathie Rhodes called to report that patient's left great toe is discolored an extremely painful. No falls or injury reported. Patient is afebrile. No open wounds or drainage as of yesterday. We will move up his November appt and also obtain an ABI. Nurse practioner may see him in order to get him seen ASAP.

## 2015-12-14 ENCOUNTER — Encounter: Payer: Self-pay | Admitting: Family

## 2015-12-14 DIAGNOSIS — M6281 Muscle weakness (generalized): Secondary | ICD-10-CM | POA: Diagnosis not present

## 2015-12-14 DIAGNOSIS — R279 Unspecified lack of coordination: Secondary | ICD-10-CM | POA: Diagnosis not present

## 2015-12-14 DIAGNOSIS — M24561 Contracture, right knee: Secondary | ICD-10-CM | POA: Diagnosis not present

## 2015-12-14 DIAGNOSIS — R278 Other lack of coordination: Secondary | ICD-10-CM | POA: Diagnosis not present

## 2015-12-14 DIAGNOSIS — R2689 Other abnormalities of gait and mobility: Secondary | ICD-10-CM | POA: Diagnosis not present

## 2015-12-14 DIAGNOSIS — R262 Difficulty in walking, not elsewhere classified: Secondary | ICD-10-CM | POA: Diagnosis not present

## 2015-12-17 ENCOUNTER — Other Ambulatory Visit: Payer: Self-pay

## 2015-12-17 ENCOUNTER — Encounter: Payer: Self-pay | Admitting: Family

## 2015-12-17 ENCOUNTER — Ambulatory Visit (HOSPITAL_COMMUNITY)
Admission: RE | Admit: 2015-12-17 | Discharge: 2015-12-17 | Disposition: A | Discharge status: Home / Self Care | Payer: Medicare Other | Source: Ambulatory Visit | Attending: Vascular Surgery | Admitting: Vascular Surgery

## 2015-12-17 ENCOUNTER — Ambulatory Visit (INDEPENDENT_AMBULATORY_CARE_PROVIDER_SITE_OTHER): Payer: Medicare Other | Admitting: Family

## 2015-12-17 VITALS — BP 158/75 | HR 57 | Temp 97.9°F | Resp 16 | Ht 73.0 in | Wt 175.0 lb

## 2015-12-17 DIAGNOSIS — R279 Unspecified lack of coordination: Secondary | ICD-10-CM | POA: Diagnosis not present

## 2015-12-17 DIAGNOSIS — I70245 Atherosclerosis of native arteries of left leg with ulceration of other part of foot: Secondary | ICD-10-CM

## 2015-12-17 DIAGNOSIS — L97529 Non-pressure chronic ulcer of other part of left foot with unspecified severity: Secondary | ICD-10-CM | POA: Insufficient documentation

## 2015-12-17 DIAGNOSIS — Z89511 Acquired absence of right leg below knee: Secondary | ICD-10-CM | POA: Insufficient documentation

## 2015-12-17 DIAGNOSIS — Z87891 Personal history of nicotine dependence: Secondary | ICD-10-CM

## 2015-12-17 DIAGNOSIS — I10 Essential (primary) hypertension: Secondary | ICD-10-CM | POA: Insufficient documentation

## 2015-12-17 DIAGNOSIS — E11621 Type 2 diabetes mellitus with foot ulcer: Secondary | ICD-10-CM | POA: Diagnosis not present

## 2015-12-17 DIAGNOSIS — M6281 Muscle weakness (generalized): Secondary | ICD-10-CM | POA: Diagnosis not present

## 2015-12-17 DIAGNOSIS — I70209 Unspecified atherosclerosis of native arteries of extremities, unspecified extremity: Secondary | ICD-10-CM | POA: Diagnosis not present

## 2015-12-17 DIAGNOSIS — R262 Difficulty in walking, not elsewhere classified: Secondary | ICD-10-CM | POA: Diagnosis not present

## 2015-12-17 DIAGNOSIS — M24561 Contracture, right knee: Secondary | ICD-10-CM | POA: Diagnosis not present

## 2015-12-17 NOTE — Patient Instructions (Signed)
Peripheral Vascular Disease Peripheral vascular disease (PVD) is a disease of the blood vessels that are not part of your heart and brain. A simple term for PVD is poor circulation. In most cases, PVD narrows the blood vessels that carry blood from your heart to the rest of your body. This can result in a decreased supply of blood to your arms, legs, and internal organs, like your stomach or kidneys. However, it most often affects a person's lower legs and feet. There are two types of PVD.  Organic PVD. This is the more common type. It is caused by damage to the structure of blood vessels.  Functional PVD. This is caused by conditions that make blood vessels contract and tighten (spasm). Without treatment, PVD tends to get worse over time. PVD can also lead to acute ischemic limb. This is when an arm or limb suddenly has trouble getting enough blood. This is a medical emergency. CAUSES Each type of PVD has many different causes. The most common cause of PVD is buildup of a fatty material (plaque) inside of your arteries (atherosclerosis). Small amounts of plaque can break off from the walls of the blood vessels and become lodged in a smaller artery. This blocks blood flow and can cause acute ischemic limb. Other common causes of PVD include:  Blood clots that form inside of blood vessels.  Injuries to blood vessels.  Diseases that cause inflammation of blood vessels or cause blood vessel spasms.  Health behaviors and health history that increase your risk of developing PVD. RISK FACTORS  You may have a greater risk of PVD if you:  Have a family history of PVD.  Have certain medical conditions, including:  High cholesterol.  Diabetes.  High blood pressure (hypertension).  Coronary heart disease.  Past problems with blood clots.  Past injury, such as burns or a broken bone. These may have damaged blood vessels in your limbs.  Buerger disease. This is caused by inflamed blood  vessels in your hands and feet.  Some forms of arthritis.  Rare birth defects that affect the arteries in your legs.  Use tobacco.  Do not get enough exercise.  Are obese.  Are age 50 or older. SIGNS AND SYMPTOMS  PVD may cause many different symptoms. Your symptoms depend on what part of your body is not getting enough blood. Some common signs and symptoms include:  Cramps in your lower legs. This may be a symptom of poor leg circulation (claudication).  Pain and weakness in your legs while you are physically active that goes away when you rest (intermittent claudication).  Leg pain when at rest.  Leg numbness, tingling, or weakness.  Coldness in a leg or foot, especially when compared with the other leg.  Skin or hair changes. These can include:  Hair loss.  Shiny skin.  Pale or bluish skin.  Thick toenails.  Inability to get or maintain an erection (erectile dysfunction). People with PVD are more prone to developing ulcers and sores on their toes, feet, or legs. These may take longer than normal to heal. DIAGNOSIS Your health care provider may diagnose PVD from your signs and symptoms. The health care provider will also do a physical exam. You may have tests to find out what is causing your PVD and determine its severity. Tests may include:  Blood pressure recordings from your arms and legs and measurements of the strength of your pulses (pulse volume recordings).  Imaging studies using sound waves to take pictures of   the blood flow through your blood vessels (Doppler ultrasound).  Injecting a dye into your blood vessels before having imaging studies using:  X-rays (angiogram or arteriogram).  Computer-generated X-rays (CT angiogram).  A powerful electromagnetic field and a computer (magnetic resonance angiogram or MRA). TREATMENT Treatment for PVD depends on the cause of your condition and the severity of your symptoms. It also depends on your age. Underlying  causes need to be treated and controlled. These include long-lasting (chronic) conditions, such as diabetes, high cholesterol, and high blood pressure. You may need to first try making lifestyle changes and taking medicines. Surgery may be needed if these do not work. Lifestyle changes may include:  Quitting smoking.  Exercising regularly.  Following a low-fat, low-cholesterol diet. Medicines may include:  Blood thinners to prevent blood clots.  Medicines to improve blood flow.  Medicines to improve your blood cholesterol levels. Surgical procedures may include:  A procedure that uses an inflated balloon to open a blocked artery and improve blood flow (angioplasty).  A procedure to put in a tube (stent) to keep a blocked artery open (stent implant).  Surgery to reroute blood flow around a blocked artery (peripheral bypass surgery).  Surgery to remove dead tissue from an infected wound on the affected limb.  Amputation. This is surgical removal of the affected limb. This may be necessary in cases of acute ischemic limb that are not improved through medical or surgical treatments. HOME CARE INSTRUCTIONS  Take medicines only as directed by your health care provider.  Do not use any tobacco products, including cigarettes, chewing tobacco, or electronic cigarettes. If you need help quitting, ask your health care provider.  Lose weight if you are overweight, and maintain a healthy weight as directed by your health care provider.  Eat a diet that is low in fat and cholesterol. If you need help, ask your health care provider.  Exercise regularly. Ask your health care provider to suggest some good activities for you.  Use compression stockings or other mechanical devices as directed by your health care provider.  Take good care of your feet.  Wear comfortable shoes that fit well.  Check your feet often for any cuts or sores. SEEK MEDICAL CARE IF:  You have cramps in your legs  while walking.  You have leg pain when you are at rest.  You have coldness in a leg or foot.  Your skin changes.  You have erectile dysfunction.  You have cuts or sores on your feet that are not healing. SEEK IMMEDIATE MEDICAL CARE IF:  Your arm or leg turns cold and blue.  Your arms or legs become red, warm, swollen, painful, or numb.  You have chest pain or trouble breathing.  You suddenly have weakness in your face, arm, or leg.  You become very confused or lose the ability to speak.  You suddenly have a very bad headache or lose your vision.   This information is not intended to replace advice given to you by your health care provider. Make sure you discuss any questions you have with your health care provider.   Document Released: 04/10/2004 Document Revised: 03/24/2014 Document Reviewed: 08/11/2013 [employer] Interactive Patient Education 2016 [employer]  

## 2015-12-17 NOTE — Progress Notes (Signed)
[facility] [place]   CC: Follow up peripheral artery occlusive disease  History of Present Illness ROMEN YUTZY is a 75 y.o. male patient of Dr. Edilia Bo who had presented with gangrene of the right foot. He had undergone attempted revascularization elsewhere which was unsuccessful and he had no further options for revascularization. 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 Previously Dr. Edilia Bo had seen him in October of last year with a superficial ulcer on his left medial malleolus and a wound on his left heel. Based on previous toe pressures Dr. Edilia Bo felt that he had adequate circulation for healing.   Dr. Edilia Bo last evaluated pt on 07/25/15. At that time he had monophasic Doppler signals in the left posterior tibial and dorsalis pedis positions with an ABI of 68% on the left. A toe pressure could not be obtained as there were no discernible Doppler waveforms. His ABI was stable compared to his most recent exam. 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 He had no open ulcers at that time. Dr. Edilia Bo encouraged him to stay as active as possible. Fortunately he is not a smoker. Pt was to return in 6 months. He was to call sooner if he had problems. He denies any history of rest pain. He is ambulatory with his prosthesis. His activity is limited so there is really no history of claudication. He is not a smoker.   He returns today with report of tip of left great toe wound appearing a week ago, with scant drainage, he denies pain at this toe or left foot. His caregiver from the [facility] is with him, states she thinks his left shoe may be rubing on his left great toe, and may have caused this wound.  He denies fever or chills.   Pt Diabetic: Yes Pt smoker: former smoker, quit in 2014, smoked for 54 years  Pt meds include: Statin :no Betablocker: Yes ASA: Yes Other  anticoagulants/antiplatelets: Plavix  Past Medical History:  Diagnosis Date  . Arthritis    "hands" (06/06/2014)  . Bipolar disorder (HCC)   . Hypercholesteremia   . Hypertension   . PAD (peripheral artery disease) (HCC)   . Peripheral vascular disease (HCC)   . Rheumatoid arthritis (HCC)   . Type II diabetes mellitus (HCC)     Social History Social History  Substance Use Topics  . Smoking status: Former Smoker    Packs/day: 1.00    Years: 54.00    Types: Cigarettes    Quit date: 05/25/2012  . Smokeless tobacco: Never Used  . Alcohol use 0.0 oz/week     Comment: 06/06/2014 "I drank 1/2 pint whiskey each day;  nothing in 20 yrs"    Family History Family History  Problem Relation Age of Onset  . Heart disease Father     Past Surgical History:  Procedure Laterality Date  . AMPUTATION Right 06/06/2014   Procedure: AMPUTATION BELOW KNEE;  Surgeon: Chuck Hint, MD;  Location: [facility] OR;  Service: Vascular;  Laterality: Right;  . BALLOON ANGIOPLASTY, ARTERY Right    attempted balloon angioplasty and stenting of the anterior tibial artery that apparently was unsuccessful. [name] 05/26/2014  . 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 KNEE ARTHROSCOPY Right 1970   [place]  . TRANSURETHRAL RESECTION OF PROSTATE  2012    Allergies  Allergen Reactions  . Lisinopril Swelling    Current Outpatient Prescriptions  Medication Sig  Dispense Refill  . alum & mag hydroxide-simeth (MYLANTA) 200-200-20 MG/5ML suspension Take 15 mLs by mouth every 6 (six) hours as needed for indigestion or heartburn.    Marland Kitchen amLODipine (NORVASC) 10 MG tablet Take 10 mg by mouth at bedtime.     Marland Kitchen aspirin 81 MG chewable tablet Chew 81 mg by mouth daily.    . busPIRone (BUSPAR) 15 MG tablet Take 15 mg by mouth at bedtime.    . carvedilol (COREG) 12.5 MG tablet Take 12.5 mg by mouth 2 (two) times daily with a meal.    . clopidogrel (PLAVIX) 75 MG tablet Take 75 mg by mouth daily.    . divalproex  (DEPAKOTE ER) 250 MG 24 hr tablet Take 250 mg by mouth 2 (two) times daily.    Marland Kitchen gabapentin (NEURONTIN) 300 MG capsule Take 300 mg by mouth 3 (three) times daily.    . insulin aspart (NOVOLOG) 100 UNIT/ML FlexPen Inject 0-20 Units into the skin 2 (two) times daily. 6:30am and 4pm per sliding scale:  CBG 150-249 3 units, 250-349 5 units, 350-449 8 units, 450-549 14 units, 550-551 20 units >551 call MD    . Insulin Glargine (LANTUS) 100 UNIT/ML Solostar Pen Inject 10 Units into the skin at bedtime.     Marland Kitchen loratadine (CLARITIN) 10 MG tablet Take 10 mg by mouth daily.    Marland Kitchen LORazepam (ATIVAN) 1 MG tablet Take 1 mg by mouth every 8 (eight) hours.    . magnesium hydroxide (MILK OF MAGNESIA) 400 MG/5ML suspension Take 30 mLs by mouth daily as needed for mild constipation.    . memantine (NAMENDA) 10 MG tablet Take 10 mg by mouth 2 (two) times daily.    . metFORMIN (GLUCOPHAGE) 1000 MG tablet Take 1,000 mg by mouth daily.    . Multiple Vitamins-Minerals (MULTIVITAMIN WITH MINERALS) tablet Take 1 tablet by mouth daily.    Marland Kitchen oxybutynin (DITROPAN-XL) 10 MG 24 hr tablet Take 10 mg by mouth at bedtime.    . risperiDONE (RISPERDAL) 0.25 MG tablet Take 0.5 mg by mouth at bedtime.     . sertraline (ZOLOFT) 100 MG tablet Take 100 mg by mouth daily.    . tamsulosin (FLOMAX) 0.4 MG CAPS capsule Take 0.4 mg by mouth at bedtime.     . vitamin C (ASCORBIC ACID) 500 MG tablet Take 500 mg by mouth 2 (two) times daily.    Marland Kitchen HYDROcodone-acetaminophen (NORCO) 7.5-325 MG per tablet Take 1 tablet by mouth every 4 (four) hours as needed for moderate pain. 30 tablet 0   No current facility-administered medications for this visit.     ROS: See HPI for pertinent positives and negatives.   Physical Examination  Vitals:   12/17/15 0913 12/17/15 0916  BP: (!) 156/73 (!) 158/75  Pulse: (!) 57   Resp: 16   Temp: 97.9 F (36.6 C)   TempSrc: Oral   SpO2: 100%   Weight: 175 lb (79.4 kg)   Height: 6\' 1"  (1.854 m)    Body  mass index is 23.09 kg/m.  General: A&O x 3, WDWN. Gait: seated in wheelchair Eyes: PERRLA. Pulmonary: Respirations are non labored, CTAB, good air movement in all fields.  Cardiac: regular rhythm, bradycardic(on a beta blocker), no detected murmur.         Carotid Bruits Right Left   Negative Negative  Aorta is not palpable. Radial pulses: 2+ palpable  VASCULAR EXAM: Extremities with ischemic changes: left great toe tip with shallow ulcer, scant s/s drainage on removed dressing, no signs of infection. without Gangrene. Right BKA prosthesis in place.                                                                                                           LE Pulses Right Left       FEMORAL  unable to palpable with pt seated in w/c unable to palpable with pt seated in w/c        POPLITEAL  not palpable   not palpable       POSTERIOR TIBIAL  BKA   not palpable        DORSALIS PEDIS      ANTERIOR TIBIAL BKA not palpable    Abdomen: soft, NT, no palpable masses. Skin: no rashes, see Extremities. Musculoskeletal: no muscle wasting or atrophy. See Extremities.   Neurologic: A&O X 3; Appropriate Affect ; SENSATION: normal; MOTOR FUNCTION:  moving all extremities equally, motor strength 4/5 throughout. Speech is fluent/normal.  CN 2-12 intact.   ASSESSMENT: KENN REKOWSKI is a 75 y.o. male who developed an ulcer on the tip of his left great toe about a week ago. This does not appear to be gangrenous, appears shallow, no signs of infection.  He is a resident of a nursing facility. The care assistant with him suggested that his left shoe may have contributed to causing the toe ulcer.  He is s/p right BKA on 06/06/2014 due to gangrene of his right foot, failed revascularization, with no further options for revascularization.  His atherosclerotic risk factors include DM and 54 years history of smoking, quit in 2014.   Off-loading open toe left shoe requested  to be obtained by the nursing facility.   DATA ABI's today: Right: BKA. Left: peroneal and PT arteries at 0.54, AT at 0.40, with monophasic waveforms, suggesting moderate arterial occlusive disease. Unable to obtain left great toe TBI due to bandage in place.    PLAN:  Based on the patient's vascular studies and examination, pt will be scheduled for an arteriogram with bilateral run off, 12/20/15, Dr. Imogene Burn, possible left LE intervention.  I discussed in depth with the patient the nature of atherosclerosis, and emphasized the importance of maximal medical management including strict control of blood pressure, blood glucose, and lipid levels, obtaining regular exercise, and continued cessation of smoking.  The patient is aware that without maximal medical management the underlying atherosclerotic disease process will progress, limiting the benefit of any interventions.  The patient was given information about PAD including signs, symptoms, treatment, what symptoms should prompt the patient to seek immediate medical care, and risk reduction measures to take.  Charisse March, RN, MSN, FNP-C [facility] [place] Phone: [phone]  Clinic MD: Myra Gianotti  12/17/15 9:32 AM

## 2015-12-18 DIAGNOSIS — M6281 Muscle weakness (generalized): Secondary | ICD-10-CM | POA: Diagnosis not present

## 2015-12-18 DIAGNOSIS — R262 Difficulty in walking, not elsewhere classified: Secondary | ICD-10-CM | POA: Diagnosis not present

## 2015-12-18 DIAGNOSIS — M24561 Contracture, right knee: Secondary | ICD-10-CM | POA: Diagnosis not present

## 2015-12-18 DIAGNOSIS — R279 Unspecified lack of coordination: Secondary | ICD-10-CM | POA: Diagnosis not present

## 2015-12-19 DIAGNOSIS — M24561 Contracture, right knee: Secondary | ICD-10-CM | POA: Diagnosis not present

## 2015-12-19 DIAGNOSIS — R279 Unspecified lack of coordination: Secondary | ICD-10-CM | POA: Diagnosis not present

## 2015-12-19 DIAGNOSIS — M6281 Muscle weakness (generalized): Secondary | ICD-10-CM | POA: Diagnosis not present

## 2015-12-19 DIAGNOSIS — R262 Difficulty in walking, not elsewhere classified: Secondary | ICD-10-CM | POA: Diagnosis not present

## 2015-12-20 DIAGNOSIS — F0390 Unspecified dementia without behavioral disturbance: Secondary | ICD-10-CM | POA: Diagnosis not present

## 2015-12-20 DIAGNOSIS — F419 Anxiety disorder, unspecified: Secondary | ICD-10-CM | POA: Diagnosis not present

## 2015-12-20 DIAGNOSIS — F329 Major depressive disorder, single episode, unspecified: Secondary | ICD-10-CM | POA: Diagnosis not present

## 2015-12-20 DIAGNOSIS — F29 Unspecified psychosis not due to a substance or known physiological condition: Secondary | ICD-10-CM | POA: Diagnosis not present

## 2015-12-21 ENCOUNTER — Encounter (HOSPITAL_COMMUNITY): Payer: Self-pay | Admitting: *Deleted

## 2015-12-21 ENCOUNTER — Ambulatory Visit (HOSPITAL_COMMUNITY)
Admission: RE | Admit: 2015-12-21 | Discharge: 2015-12-21 | Disposition: A | Discharge status: Home / Self Care | Payer: Medicare Other | Source: Ambulatory Visit | Attending: Vascular Surgery | Admitting: Vascular Surgery

## 2015-12-21 ENCOUNTER — Encounter (HOSPITAL_COMMUNITY)
Admission: RE | Disposition: A | Discharge status: Home / Self Care | Payer: Self-pay | Source: Ambulatory Visit | Attending: Vascular Surgery

## 2015-12-21 ENCOUNTER — Other Ambulatory Visit: Payer: Self-pay

## 2015-12-21 DIAGNOSIS — I70245 Atherosclerosis of native arteries of left leg with ulceration of other part of foot: Secondary | ICD-10-CM | POA: Insufficient documentation

## 2015-12-21 DIAGNOSIS — E11621 Type 2 diabetes mellitus with foot ulcer: Secondary | ICD-10-CM | POA: Diagnosis not present

## 2015-12-21 DIAGNOSIS — Z89511 Acquired absence of right leg below knee: Secondary | ICD-10-CM | POA: Insufficient documentation

## 2015-12-21 DIAGNOSIS — F319 Bipolar disorder, unspecified: Secondary | ICD-10-CM | POA: Diagnosis not present

## 2015-12-21 DIAGNOSIS — Z7982 Long term (current) use of aspirin: Secondary | ICD-10-CM | POA: Insufficient documentation

## 2015-12-21 DIAGNOSIS — Z87891 Personal history of nicotine dependence: Secondary | ICD-10-CM | POA: Insufficient documentation

## 2015-12-21 DIAGNOSIS — Z794 Long term (current) use of insulin: Secondary | ICD-10-CM | POA: Diagnosis not present

## 2015-12-21 DIAGNOSIS — E7800 Pure hypercholesterolemia, unspecified: Secondary | ICD-10-CM | POA: Diagnosis not present

## 2015-12-21 DIAGNOSIS — M069 Rheumatoid arthritis, unspecified: Secondary | ICD-10-CM | POA: Diagnosis not present

## 2015-12-21 DIAGNOSIS — E1151 Type 2 diabetes mellitus with diabetic peripheral angiopathy without gangrene: Secondary | ICD-10-CM | POA: Insufficient documentation

## 2015-12-21 DIAGNOSIS — Z01812 Encounter for preprocedural laboratory examination: Secondary | ICD-10-CM

## 2015-12-21 DIAGNOSIS — Z7902 Long term (current) use of antithrombotics/antiplatelets: Secondary | ICD-10-CM | POA: Diagnosis not present

## 2015-12-21 DIAGNOSIS — L97529 Non-pressure chronic ulcer of other part of left foot with unspecified severity: Secondary | ICD-10-CM | POA: Diagnosis not present

## 2015-12-21 DIAGNOSIS — I10 Essential (primary) hypertension: Secondary | ICD-10-CM | POA: Diagnosis not present

## 2015-12-21 DIAGNOSIS — I70244 Atherosclerosis of native arteries of left leg with ulceration of heel and midfoot: Secondary | ICD-10-CM | POA: Diagnosis not present

## 2015-12-21 HISTORY — PX: PERIPHERAL VASCULAR CATHETERIZATION: SHX172C

## 2015-12-21 LAB — POCT I-STAT, CHEM 8
BUN: 16 mg/dL (ref 6–20)
CHLORIDE: 102 mmol/L (ref 101–111)
CREATININE: 0.8 mg/dL (ref 0.61–1.24)
Calcium, Ion: 1.19 mmol/L (ref 1.15–1.40)
Glucose, Bld: 130 mg/dL — ABNORMAL HIGH (ref 65–99)
HEMATOCRIT: 32 % — AB (ref 39.0–52.0)
Hemoglobin: 10.9 g/dL — ABNORMAL LOW (ref 13.0–17.0)
POTASSIUM: 4.3 mmol/L (ref 3.5–5.1)
SODIUM: 141 mmol/L (ref 135–145)
TCO2: 26 mmol/L (ref 0–100)

## 2015-12-21 LAB — GLUCOSE, CAPILLARY: Glucose-Capillary: 124 mg/dL — ABNORMAL HIGH (ref 65–99)

## 2015-12-21 SURGERY — ABDOMINAL AORTOGRAM
Anesthesia: LOCAL

## 2015-12-21 MED ORDER — DIPHENHYDRAMINE HCL 50 MG/ML IJ SOLN
INTRAMUSCULAR | Status: AC
  Filled 2015-12-21: qty 1

## 2015-12-21 MED ORDER — DIPHENHYDRAMINE HCL 50 MG/ML IJ SOLN
INTRAMUSCULAR | Status: DC | PRN
  Administered 2015-12-21: 25 mg via INTRAVENOUS

## 2015-12-21 MED ORDER — HYDRALAZINE HCL 20 MG/ML IJ SOLN
INTRAMUSCULAR | Status: DC | PRN
  Administered 2015-12-21 (×2): 10 mg via INTRAVENOUS

## 2015-12-21 MED ORDER — IODIXANOL 320 MG/ML IV SOLN
INTRAVENOUS | Status: DC | PRN
  Administered 2015-12-21: 170 mL via INTRA_ARTERIAL

## 2015-12-21 MED ORDER — LIDOCAINE HCL (PF) 1 % IJ SOLN
INTRAMUSCULAR | Status: AC
  Filled 2015-12-21: qty 30

## 2015-12-21 MED ORDER — LIDOCAINE HCL (PF) 1 % IJ SOLN
INTRAMUSCULAR | Status: DC | PRN
  Administered 2015-12-21: 12 mL via SUBCUTANEOUS

## 2015-12-21 MED ORDER — DIPHENHYDRAMINE HCL 50 MG/ML IJ SOLN: 25.0000 mg | Freq: Once | INTRAMUSCULAR | Status: DC

## 2015-12-21 MED ORDER — HEPARIN (PORCINE) IN NACL 2-0.9 UNIT/ML-% IJ SOLN
INTRAMUSCULAR | Status: DC | PRN
  Administered 2015-12-21: 1000 mL via INTRA_ARTERIAL

## 2015-12-21 MED ORDER — SODIUM CHLORIDE 0.9 % IV SOLN
INTRAVENOUS | Status: DC
  Administered 2015-12-21: 07:00:00 via INTRAVENOUS

## 2015-12-21 MED ORDER — HYDRALAZINE HCL 20 MG/ML IJ SOLN
INTRAMUSCULAR | Status: AC
  Filled 2015-12-21: qty 1

## 2015-12-21 SURGICAL SUPPLY — 9 items
CATH ANGIO 5F PIGTAIL 65CM (CATHETERS) ×2 IMPLANT
CATH CROSS OVER TEMPO 5F (CATHETERS) ×2 IMPLANT
CATH STRAIGHT 5FR 65CM (CATHETERS) ×2 IMPLANT
KIT PV (KITS) ×2 IMPLANT
SHEATH PINNACLE 5F 10CM (SHEATH) ×2 IMPLANT
SYR MEDRAD MARK V 150ML (SYRINGE) ×2 IMPLANT
TRANSDUCER W/STOPCOCK (MISCELLANEOUS) ×2 IMPLANT
TRAY PV CATH (CUSTOM PROCEDURE TRAY) ×2 IMPLANT
WIRE BENTSON .035X145CM (WIRE) ×2 IMPLANT

## 2015-12-21 NOTE — Interval H&P Note (Signed)
History and Physical Interval Note:  12/21/2015 7:46 AM  Terry Green  has presented today for surgery, with the diagnosis of pv  The various methods of treatment have been discussed with the patient and family. After consideration of risks, benefits and other options for treatment, the patient has consented to  Procedure(s): Abdominal Aortogram (N/A) as a surgical intervention .  The patient's history has been reviewed, patient examined, no change in status, stable for surgery.  I have reviewed the patient's chart and labs.  Questions were answered to the patient's satisfaction.     Fabienne Bruns

## 2015-12-21 NOTE — Progress Notes (Signed)
This nurse called and spoke to Terry Green at [facility] regarding last doses of meds. MAR sent with pt but verified that pt had no meds this morning prior to arrival.

## 2015-12-21 NOTE — Op Note (Signed)
Procedure: Ultrasound-guided cannulation of right common femoral artery, aortogram with bilateral lower extremity runoff  Preoperative diagnosis: Nonhealing wound left foot. Her postoperative diagnosis: Same  Anesthesia: Local  Operative findings: #1 severe infrapopliteal tibial artery occlusive disease one-vessel runoff peroneal artery left leg  #2 possible IV contrast reaction with possible hives or possible reaction to the sticky drape in the right groin  Operative details: After obtaining informed consent, patient brought to the PV lab. The patient was placed in supine position the Angio table. Both groins were prepped and draped usual sterile fashion. Local anesthesia was infiltrated over the right common femoral artery. Ultrasound was used to identify the right common femoral artery in an introducer needle was used to cannulate this without difficulty. An 035 versacore wire was threaded up the abdominal aorta under fluoroscopic guidance. A 5 French sheath placed over the guidewire the right common femoral artery. This was thoroughly flushed with heparinized saline. A 5 French Omni Flush catheter was then placed over the guidewire into the abdominal aorta and abdominal aortogram was obtained in AP projection. Left right renal arteries are patent. Infrarenal abdominal aorta is patent. The left and right common and external iliac arteries are widely patent. The internal iliac arteries are small and diseased bilaterally but patent. The left and right common femoral and proximal portions of the profunda and superficial femoral artery are patent. At this point the pubic a catheter was pulled down just above the aortic bifurcation to confirm the above findings. Lower extremity runoff views were then obtained through the pigtail catheter.  In the right lower extremity, the right common femoral profunda femoris and superficial femoral arteries are patent. There is a right below-knee amputation.  In the left  lower extremity, the left common femoral profunda femoris and superficial femoral arteries are patent. There is irregular atherosclerosis with multiple segments of 25-30% stenosis diffusely throughout the left superficial femoral artery. The left popliteal artery occludes just below the knee joint. The anterior tibial and posterior tibial arteries are occluded. There is reconstitution of the left peroneal artery in the mid leg. In order to get better visualization of the tibial vessels below the knee on the left side, the 5 [place] pig catheter was removed over guidewire exchange for a 5 [place] crossover catheter. This was used to selectively catheterize the left common iliac artery and advance the wire down into the distal left external iliac artery. The Crossover catheter was then removed over the guidewire replaced with a 5 French straight catheter. Left lower extremity runoff views were then obtained. This showed the popliteal artery occludes just below the knee joint. The anterior and posterior tibial arteries are completely occluded. There is a peroneal artery that reconstitutes in the mid leg. This gives off an anterior and posterior communicating branch but I did not visualize a pedal arch dorsalis pedis or the posterior tibial artery in the foot.  At this point a 5 [place] straight catheter was removed. A 5 French sheath was left in place to be pulled in the holding area. The patient tolerated procedure well and there were no, locations. The patient did leave all to raised macular areas in the right groin potentially an area of the drape and had some red marks on the right side of his neck which potentially may represent hives. The patient was given 25 mg of IV Benadryl. He did not have any airway compromise or complaint of itching in these areas.  Operative management: I will discuss the angiogram findings  with Dr. Edilia Bo who will decide whether or not the patient needs a primary amputation versus  popliteal to peroneal bypass versus pedal access of the peroneal artery and angioplasty of the peroneal artery.  Fabienne Bruns, MD [facility] [place] Office: [phone] Pager: [phone]

## 2015-12-21 NOTE — Discharge Instructions (Signed)
°  NO METFORMIN/GLUCOPHAGE FOR 2 DAYS ° ° °Groin Surgical Site Care °Refer to this sheet in the next few weeks. These instructions provide you with information about caring for yourself after your procedure. Your health care provider may also give you more specific instructions. Your treatment has been planned according to current medical practices, but problems sometimes occur. Call your health care provider if you have any problems or questions after your procedure. °WHAT TO EXPECT AFTER THE PROCEDURE °After your procedure, it is typical to have the following: °· Bruising at the groin site that usually fades within 1-2 weeks. °· Blood collecting in the tissue (hematoma) that may be painful to the touch. It should usually decrease in size and tenderness within 1-2 weeks. °HOME CARE INSTRUCTIONS °· Take medicines only as directed by your health care provider. °· You may shower 24-48 hours after the procedure or as directed by your health care provider. Remove the bandage (dressing) and gently wash the site with plain soap and water. Pat the area dry with a clean towel. Do not rub the site, because this may cause bleeding. °· Do not take baths, swim, or use a hot tub until your health care provider approves. °· Check your insertion site every day for redness, swelling, or drainage. °· Do not apply powder or lotion to the site. °· Limit use of stairs to twice a day for the first 2-3 days or as directed by your health care provider. °· Do not squat for the first 2-3 days or as directed by your health care provider. °· Do not lift over 10 lb (4.5 kg) for 5 days after your procedure or as directed by your health care provider. °· Ask your health care provider when it is okay to: °¨ Return to work or school. °¨ Resume usual physical activities or sports. °¨ Resume sexual activity. °· Do not drive home if you are discharged the same day as the procedure. Have someone else drive you. °· You may drive 24 hours after the  procedure unless otherwise instructed by your health care provider. °· Do not operate machinery or power tools for 24 hours after the procedure or as directed by your health care provider. °· If your procedure was done as an outpatient procedure, which means that you went home the same day as your procedure, a responsible adult should be with you for the first 24 hours after you arrive home. °· Keep all follow-up visits as directed by your health care provider. This is important. °SEEK MEDICAL CARE IF: °· You have a fever. °· You have chills. °· You have increased bleeding from the groin site. Hold pressure on the site. °SEEK IMMEDIATE MEDICAL CARE IF: °· You have unusual pain at the groin site. °· You have redness, warmth, or swelling at the groin site. °· You have drainage (other than a small amount of blood on the dressing) from the groin site. °· The groin site is bleeding, and the bleeding does not stop after 30 minutes of holding steady pressure on the site. °· Your leg or foot becomes pale, cool, tingly, or numb. °  °This information is not intended to replace advice given to you by your health care provider. Make sure you discuss any questions you have with your health care provider. °  °Document Released: 11/04/2013 Document Reviewed: 11/04/2013 °Elsevier Interactive Patient Education ©2016 [employer] ° °

## 2015-12-21 NOTE — H&P (View-Only) (Signed)
[facility] [place]   CC: Follow up peripheral artery occlusive disease  History of Present Illness ROMEN YUTZY is a 75 y.o. male patient of Dr. Edilia Bo who had presented with gangrene of the right foot. He had undergone attempted revascularization elsewhere which was unsuccessful and he had no further options for revascularization. 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 Previously Dr. Edilia Bo had seen him in October of last year with a superficial ulcer on his left medial malleolus and a wound on his left heel. Based on previous toe pressures Dr. Edilia Bo felt that he had adequate circulation for healing.   Dr. Edilia Bo last evaluated pt on 07/25/15. At that time he had monophasic Doppler signals in the left posterior tibial and dorsalis pedis positions with an ABI of 68% on the left. A toe pressure could not be obtained as there were no discernible Doppler waveforms. His ABI was stable compared to his most recent exam. 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 He had no open ulcers at that time. Dr. Edilia Bo encouraged him to stay as active as possible. Fortunately he is not a smoker. Pt was to return in 6 months. He was to call sooner if he had problems. He denies any history of rest pain. He is ambulatory with his prosthesis. His activity is limited so there is really no history of claudication. He is not a smoker.   He returns today with report of tip of left great toe wound appearing a week ago, with scant drainage, he denies pain at this toe or left foot. His caregiver from the [facility] is with him, states she thinks his left shoe may be rubing on his left great toe, and may have caused this wound.  He denies fever or chills.   Pt Diabetic: Yes Pt smoker: former smoker, quit in 2014, smoked for 54 years  Pt meds include: Statin :no Betablocker: Yes ASA: Yes Other  anticoagulants/antiplatelets: Plavix  Past Medical History:  Diagnosis Date  . Arthritis    "hands" (06/06/2014)  . Bipolar disorder (HCC)   . Hypercholesteremia   . Hypertension   . PAD (peripheral artery disease) (HCC)   . Peripheral vascular disease (HCC)   . Rheumatoid arthritis (HCC)   . Type II diabetes mellitus (HCC)     Social History Social History  Substance Use Topics  . Smoking status: Former Smoker    Packs/day: 1.00    Years: 54.00    Types: Cigarettes    Quit date: 05/25/2012  . Smokeless tobacco: Never Used  . Alcohol use 0.0 oz/week     Comment: 06/06/2014 "I drank 1/2 pint whiskey each day;  nothing in 20 yrs"    Family History Family History  Problem Relation Age of Onset  . Heart disease Father     Past Surgical History:  Procedure Laterality Date  . AMPUTATION Right 06/06/2014   Procedure: AMPUTATION BELOW KNEE;  Surgeon: Chuck Hint, MD;  Location: [facility] OR;  Service: Vascular;  Laterality: Right;  . BALLOON ANGIOPLASTY, ARTERY Right    attempted balloon angioplasty and stenting of the anterior tibial artery that apparently was unsuccessful. [name] 05/26/2014  . 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 KNEE ARTHROSCOPY Right 1970   [place]  . TRANSURETHRAL RESECTION OF PROSTATE  2012    Allergies  Allergen Reactions  . Lisinopril Swelling    Current Outpatient Prescriptions  Medication Sig  Dispense Refill  . alum & mag hydroxide-simeth (MYLANTA) 200-200-20 MG/5ML suspension Take 15 mLs by mouth every 6 (six) hours as needed for indigestion or heartburn.    Marland Kitchen amLODipine (NORVASC) 10 MG tablet Take 10 mg by mouth at bedtime.     Marland Kitchen aspirin 81 MG chewable tablet Chew 81 mg by mouth daily.    . busPIRone (BUSPAR) 15 MG tablet Take 15 mg by mouth at bedtime.    . carvedilol (COREG) 12.5 MG tablet Take 12.5 mg by mouth 2 (two) times daily with a meal.    . clopidogrel (PLAVIX) 75 MG tablet Take 75 mg by mouth daily.    . divalproex  (DEPAKOTE ER) 250 MG 24 hr tablet Take 250 mg by mouth 2 (two) times daily.    Marland Kitchen gabapentin (NEURONTIN) 300 MG capsule Take 300 mg by mouth 3 (three) times daily.    . insulin aspart (NOVOLOG) 100 UNIT/ML FlexPen Inject 0-20 Units into the skin 2 (two) times daily. 6:30am and 4pm per sliding scale:  CBG 150-249 3 units, 250-349 5 units, 350-449 8 units, 450-549 14 units, 550-551 20 units >551 call MD    . Insulin Glargine (LANTUS) 100 UNIT/ML Solostar Pen Inject 10 Units into the skin at bedtime.     Marland Kitchen loratadine (CLARITIN) 10 MG tablet Take 10 mg by mouth daily.    Marland Kitchen LORazepam (ATIVAN) 1 MG tablet Take 1 mg by mouth every 8 (eight) hours.    . magnesium hydroxide (MILK OF MAGNESIA) 400 MG/5ML suspension Take 30 mLs by mouth daily as needed for mild constipation.    . memantine (NAMENDA) 10 MG tablet Take 10 mg by mouth 2 (two) times daily.    . metFORMIN (GLUCOPHAGE) 1000 MG tablet Take 1,000 mg by mouth daily.    . Multiple Vitamins-Minerals (MULTIVITAMIN WITH MINERALS) tablet Take 1 tablet by mouth daily.    Marland Kitchen oxybutynin (DITROPAN-XL) 10 MG 24 hr tablet Take 10 mg by mouth at bedtime.    . risperiDONE (RISPERDAL) 0.25 MG tablet Take 0.5 mg by mouth at bedtime.     . sertraline (ZOLOFT) 100 MG tablet Take 100 mg by mouth daily.    . tamsulosin (FLOMAX) 0.4 MG CAPS capsule Take 0.4 mg by mouth at bedtime.     . vitamin C (ASCORBIC ACID) 500 MG tablet Take 500 mg by mouth 2 (two) times daily.    Marland Kitchen HYDROcodone-acetaminophen (NORCO) 7.5-325 MG per tablet Take 1 tablet by mouth every 4 (four) hours as needed for moderate pain. 30 tablet 0   No current facility-administered medications for this visit.     ROS: See HPI for pertinent positives and negatives.   Physical Examination  Vitals:   12/17/15 0913 12/17/15 0916  BP: (!) 156/73 (!) 158/75  Pulse: (!) 57   Resp: 16   Temp: 97.9 F (36.6 C)   TempSrc: Oral   SpO2: 100%   Weight: 175 lb (79.4 kg)   Height: 6\' 1"  (1.854 m)    Body  mass index is 23.09 kg/m.  General: A&O x 3, WDWN. Gait: seated in wheelchair Eyes: PERRLA. Pulmonary: Respirations are non labored, CTAB, good air movement in all fields.  Cardiac: regular rhythm, bradycardic(on a beta blocker), no detected murmur.         Carotid Bruits Right Left   Negative Negative  Aorta is not palpable. Radial pulses: 2+ palpable  VASCULAR EXAM: Extremities with ischemic changes: left great toe tip with shallow ulcer, scant s/s drainage on removed dressing, no signs of infection. without Gangrene. Right BKA prosthesis in place.                                                                                                           LE Pulses Right Left       FEMORAL  unable to palpable with pt seated in w/c unable to palpable with pt seated in w/c        POPLITEAL  not palpable   not palpable       POSTERIOR TIBIAL  BKA   not palpable        DORSALIS PEDIS      ANTERIOR TIBIAL BKA not palpable    Abdomen: soft, NT, no palpable masses. Skin: no rashes, see Extremities. Musculoskeletal: no muscle wasting or atrophy. See Extremities.   Neurologic: A&O X 3; Appropriate Affect ; SENSATION: normal; MOTOR FUNCTION:  moving all extremities equally, motor strength 4/5 throughout. Speech is fluent/normal.  CN 2-12 intact.   ASSESSMENT: Bransyn M Ulrey is a 74 y.o. male who developed an ulcer on the tip of his left great toe about a week ago. This does not appear to be gangrenous, appears shallow, no signs of infection.  He is a resident of a nursing facility. The care assistant with him suggested that his left shoe may have contributed to causing the toe ulcer.  He is s/p right BKA on 06/06/2014 due to gangrene of his right foot, failed revascularization, with no further options for revascularization.  His atherosclerotic risk factors include DM and 54 years history of smoking, quit in 2014.   Off-loading open toe left shoe requested  to be obtained by the nursing facility.   DATA ABI's today: Right: BKA. Left: peroneal and PT arteries at 0.54, AT at 0.40, with monophasic waveforms, suggesting moderate arterial occlusive disease. Unable to obtain left great toe TBI due to bandage in place.    PLAN:  Based on the patient's vascular studies and examination, pt will be scheduled for an arteriogram with bilateral run off, 12/20/15, Dr. Chen, possible left LE intervention.  I discussed in depth with the patient the nature of atherosclerosis, and emphasized the importance of maximal medical management including strict control of blood pressure, blood glucose, and lipid levels, obtaining regular exercise, and continued cessation of smoking.  The patient is aware that without maximal medical management the underlying atherosclerotic disease process will progress, limiting the benefit of any interventions.  The patient was given information about PAD including signs, symptoms, treatment, what symptoms should prompt the patient to seek immediate medical care, and risk reduction measures to take.  Estelene Carmack, RN, MSN, FNP-C [facility] [place] Office Phone: [phone]  Clinic MD: [place]  12/17/15 9:32 AM   

## 2015-12-23 DIAGNOSIS — M24561 Contracture, right knee: Secondary | ICD-10-CM | POA: Diagnosis not present

## 2015-12-23 DIAGNOSIS — Z23 Encounter for immunization: Secondary | ICD-10-CM | POA: Diagnosis not present

## 2015-12-23 DIAGNOSIS — M6281 Muscle weakness (generalized): Secondary | ICD-10-CM | POA: Diagnosis not present

## 2015-12-23 DIAGNOSIS — R262 Difficulty in walking, not elsewhere classified: Secondary | ICD-10-CM | POA: Diagnosis not present

## 2015-12-23 DIAGNOSIS — R279 Unspecified lack of coordination: Secondary | ICD-10-CM | POA: Diagnosis not present

## 2015-12-24 ENCOUNTER — Telehealth: Payer: Self-pay | Admitting: Vascular Surgery

## 2015-12-24 DIAGNOSIS — R279 Unspecified lack of coordination: Secondary | ICD-10-CM | POA: Diagnosis not present

## 2015-12-24 DIAGNOSIS — R262 Difficulty in walking, not elsewhere classified: Secondary | ICD-10-CM | POA: Diagnosis not present

## 2015-12-24 DIAGNOSIS — M6281 Muscle weakness (generalized): Secondary | ICD-10-CM | POA: Diagnosis not present

## 2015-12-24 DIAGNOSIS — M24561 Contracture, right knee: Secondary | ICD-10-CM | POA: Diagnosis not present

## 2015-12-24 NOTE — Telephone Encounter (Signed)
Spoke to [place] with Lynann Bologna for [place] on 10/13 and f/u on 11/18

## 2015-12-24 NOTE — Telephone Encounter (Signed)
-----   Message from Phillips Odor, RN sent at 12/21/2015  5:03 PM EDT ----- Regarding: needs Vein Map of Left LE and office visit with [facility] 01/02/16   ----- Message ----- From: Chuck Hint, MD Sent: 12/21/2015   2:48 PM To: [facility] Charge Pool Subject: office visit                                   This patient had an agram today by [facility]. He was discharged before I could see him. I will need to see him in the office with a vein map of the left LE on 01/02/16. Thanks CD

## 2015-12-25 DIAGNOSIS — R279 Unspecified lack of coordination: Secondary | ICD-10-CM | POA: Diagnosis not present

## 2015-12-25 DIAGNOSIS — R262 Difficulty in walking, not elsewhere classified: Secondary | ICD-10-CM | POA: Diagnosis not present

## 2015-12-25 DIAGNOSIS — M6281 Muscle weakness (generalized): Secondary | ICD-10-CM | POA: Diagnosis not present

## 2015-12-25 DIAGNOSIS — M24561 Contracture, right knee: Secondary | ICD-10-CM | POA: Diagnosis not present

## 2015-12-26 DIAGNOSIS — M6281 Muscle weakness (generalized): Secondary | ICD-10-CM | POA: Diagnosis not present

## 2015-12-26 DIAGNOSIS — R279 Unspecified lack of coordination: Secondary | ICD-10-CM | POA: Diagnosis not present

## 2015-12-26 DIAGNOSIS — R262 Difficulty in walking, not elsewhere classified: Secondary | ICD-10-CM | POA: Diagnosis not present

## 2015-12-26 DIAGNOSIS — M24561 Contracture, right knee: Secondary | ICD-10-CM | POA: Diagnosis not present

## 2015-12-27 ENCOUNTER — Encounter: Payer: Self-pay | Admitting: Vascular Surgery

## 2015-12-27 DIAGNOSIS — E119 Type 2 diabetes mellitus without complications: Secondary | ICD-10-CM | POA: Diagnosis not present

## 2015-12-27 DIAGNOSIS — I10 Essential (primary) hypertension: Secondary | ICD-10-CM | POA: Diagnosis not present

## 2015-12-27 DIAGNOSIS — R262 Difficulty in walking, not elsewhere classified: Secondary | ICD-10-CM | POA: Diagnosis not present

## 2015-12-27 DIAGNOSIS — I7389 Other specified peripheral vascular diseases: Secondary | ICD-10-CM | POA: Diagnosis not present

## 2015-12-27 DIAGNOSIS — M6281 Muscle weakness (generalized): Secondary | ICD-10-CM | POA: Diagnosis not present

## 2015-12-27 DIAGNOSIS — M24561 Contracture, right knee: Secondary | ICD-10-CM | POA: Diagnosis not present

## 2015-12-27 DIAGNOSIS — R279 Unspecified lack of coordination: Secondary | ICD-10-CM | POA: Diagnosis not present

## 2015-12-28 ENCOUNTER — Ambulatory Visit (HOSPITAL_COMMUNITY)
Admission: RE | Admit: 2015-12-28 | Discharge: 2015-12-28 | Disposition: A | Discharge status: Home / Self Care | Payer: Medicare Other | Source: Ambulatory Visit | Attending: Vascular Surgery | Admitting: Vascular Surgery

## 2015-12-28 DIAGNOSIS — R262 Difficulty in walking, not elsewhere classified: Secondary | ICD-10-CM | POA: Diagnosis not present

## 2015-12-28 DIAGNOSIS — Z01812 Encounter for preprocedural laboratory examination: Secondary | ICD-10-CM | POA: Diagnosis not present

## 2015-12-28 DIAGNOSIS — L97529 Non-pressure chronic ulcer of other part of left foot with unspecified severity: Secondary | ICD-10-CM | POA: Diagnosis not present

## 2015-12-28 DIAGNOSIS — R279 Unspecified lack of coordination: Secondary | ICD-10-CM | POA: Diagnosis not present

## 2015-12-28 DIAGNOSIS — I70245 Atherosclerosis of native arteries of left leg with ulceration of other part of foot: Secondary | ICD-10-CM | POA: Diagnosis not present

## 2015-12-28 DIAGNOSIS — M6281 Muscle weakness (generalized): Secondary | ICD-10-CM | POA: Diagnosis not present

## 2015-12-28 DIAGNOSIS — M24561 Contracture, right knee: Secondary | ICD-10-CM | POA: Diagnosis not present

## 2015-12-30 DIAGNOSIS — R262 Difficulty in walking, not elsewhere classified: Secondary | ICD-10-CM | POA: Diagnosis not present

## 2015-12-30 DIAGNOSIS — M24561 Contracture, right knee: Secondary | ICD-10-CM | POA: Diagnosis not present

## 2015-12-30 DIAGNOSIS — R279 Unspecified lack of coordination: Secondary | ICD-10-CM | POA: Diagnosis not present

## 2015-12-30 DIAGNOSIS — M6281 Muscle weakness (generalized): Secondary | ICD-10-CM | POA: Diagnosis not present

## 2015-12-31 DIAGNOSIS — M24561 Contracture, right knee: Secondary | ICD-10-CM | POA: Diagnosis not present

## 2015-12-31 DIAGNOSIS — R279 Unspecified lack of coordination: Secondary | ICD-10-CM | POA: Diagnosis not present

## 2015-12-31 DIAGNOSIS — R262 Difficulty in walking, not elsewhere classified: Secondary | ICD-10-CM | POA: Diagnosis not present

## 2015-12-31 DIAGNOSIS — M6281 Muscle weakness (generalized): Secondary | ICD-10-CM | POA: Diagnosis not present

## 2016-01-01 ENCOUNTER — Encounter (HOSPITAL_COMMUNITY): Payer: Medicare Other

## 2016-01-01 DIAGNOSIS — M6281 Muscle weakness (generalized): Secondary | ICD-10-CM | POA: Diagnosis not present

## 2016-01-01 DIAGNOSIS — R279 Unspecified lack of coordination: Secondary | ICD-10-CM | POA: Diagnosis not present

## 2016-01-01 DIAGNOSIS — R262 Difficulty in walking, not elsewhere classified: Secondary | ICD-10-CM | POA: Diagnosis not present

## 2016-01-01 DIAGNOSIS — M24561 Contracture, right knee: Secondary | ICD-10-CM | POA: Diagnosis not present

## 2016-01-02 ENCOUNTER — Encounter: Payer: Self-pay | Admitting: Vascular Surgery

## 2016-01-02 ENCOUNTER — Ambulatory Visit (INDEPENDENT_AMBULATORY_CARE_PROVIDER_SITE_OTHER): Payer: Medicare Other | Admitting: Vascular Surgery

## 2016-01-02 VITALS — BP 133/66 | HR 62 | Temp 97.8°F | Resp 18 | Ht 73.0 in | Wt 178.0 lb

## 2016-01-02 DIAGNOSIS — I70245 Atherosclerosis of native arteries of left leg with ulceration of other part of foot: Secondary | ICD-10-CM

## 2016-01-02 DIAGNOSIS — I70209 Unspecified atherosclerosis of native arteries of extremities, unspecified extremity: Secondary | ICD-10-CM

## 2016-01-02 NOTE — Progress Notes (Signed)
Patient name: Terry Green MRN: [id number] DOB: [birth year] Sex: male  REASON FOR VISIT: Evaluate for possible bypass.  HPI: Terry Green is a 75 y.o. male who was seen by our nurse practitioner on 10-17. He has undergone a previous right below the knee amputation on 06/06/2014. I last saw the patient on 07/25/2015. At that time, he had an ABI on the left of 68% and I thought that he had adequate circulation given that he had no open wounds. At the time of his last visit he reported a wound on his left great toe. He was set up for an arteriogram which showed severe tibial artery occlusive disease. He presents for evaluation for possible bypass.  He underwent an arteriogram on 12/21/2015 by Dr. Fabienne Bruns. This showed that on the left side he was patent down to the below-knee popliteal artery. There was occlusion of all 3 tibial vessels with reconstitution of the peroneal artery in the mid leg. He has extensive collaterals.  He tells me that the wound on his left great toe has improved. It had been draining some but now is dry with no drainage.  Past Medical History:  Diagnosis Date  . Arthritis    "hands" (06/06/2014)  . Bipolar disorder (HCC)   . Hypercholesteremia   . Hypertension   . PAD (peripheral artery disease) (HCC)   . Peripheral vascular disease (HCC)   . Rheumatoid arthritis (HCC)   . Type II diabetes mellitus (HCC)     Family History  Problem Relation Age of Onset  . Heart disease Father     SOCIAL HISTORY: Social History  Substance Use Topics  . Smoking status: Former Smoker    Packs/day: 1.00    Years: 54.00    Types: Cigarettes    Quit date: 05/25/2012  . Smokeless tobacco: Never Used  . Alcohol use 0.0 oz/week     Comment: 06/06/2014 "I drank 1/2 pint whiskey each day;  nothing in 20 yrs"    Allergies  Allergen Reactions  . Lisinopril Swelling    Current Outpatient Prescriptions  Medication Sig Dispense Refill  . alum & mag hydroxide-simeth  (MYLANTA) 200-200-20 MG/5ML suspension Take 15 mLs by mouth every 6 (six) hours as needed for indigestion or heartburn.    Marland Kitchen amLODipine (NORVASC) 10 MG tablet Take 10 mg by mouth at bedtime.     Marland Kitchen aspirin 81 MG chewable tablet Chew 81 mg by mouth daily.    . busPIRone (BUSPAR) 15 MG tablet Take 15 mg by mouth at bedtime.    . carvedilol (COREG) 12.5 MG tablet Take 12.5 mg by mouth 2 (two) times daily with a meal.    . clopidogrel (PLAVIX) 75 MG tablet Take 75 mg by mouth daily.    . divalproex (DEPAKOTE ER) 250 MG 24 hr tablet Take 250 mg by mouth 2 (two) times daily.    Marland Kitchen gabapentin (NEURONTIN) 300 MG capsule Take 300 mg by mouth 3 (three) times daily.    Marland Kitchen HYDROcodone-acetaminophen (NORCO) 7.5-325 MG per tablet Take 1 tablet by mouth every 4 (four) hours as needed for moderate pain. 30 tablet 0  . insulin aspart (NOVOLOG) 100 UNIT/ML FlexPen Inject 0-20 Units into the skin 2 (two) times daily. 6:30am and 4pm per sliding scale:  CBG 150-249 3 units, 250-349 5 units, 350-449 8 units, 450-549 14 units, 550-551 20 units >551 call MD    . Insulin Glargine (LANTUS) 100 UNIT/ML Solostar Pen Inject 15 Units into the skin at  bedtime.     Marland Kitchen loratadine (CLARITIN) 10 MG tablet Take 10 mg by mouth daily.    Marland Kitchen LORazepam (ATIVAN) 1 MG tablet Take 1 mg by mouth every 12 (twelve) hours as needed.     . magnesium hydroxide (MILK OF MAGNESIA) 400 MG/5ML suspension Take 30 mLs by mouth daily as needed for mild constipation.    . memantine (NAMENDA) 10 MG tablet Take 10 mg by mouth 2 (two) times daily.    . metFORMIN (GLUCOPHAGE) 1000 MG tablet Take 1,000 mg by mouth 2 (two) times daily with a meal.     . Multiple Vitamins-Minerals (MULTIVITAMIN WITH MINERALS) tablet Take 1 tablet by mouth daily.    Marland Kitchen oxybutynin (DITROPAN-XL) 10 MG 24 hr tablet Take 10 mg by mouth at bedtime.    . risperiDONE (RISPERDAL) 0.5 MG tablet Take 0.5 mg by mouth at bedtime.    . sertraline (ZOLOFT) 100 MG tablet Take 100 mg by mouth daily.     . tamsulosin (FLOMAX) 0.4 MG CAPS capsule Take 0.4 mg by mouth at bedtime.     . vitamin C (ASCORBIC ACID) 500 MG tablet Take 500 mg by mouth 2 (two) times daily.     No current facility-administered medications for this visit.     REVIEW OF SYSTEMS:  [X]  denotes positive finding, [ ]  denotes negative finding Cardiac  Comments:  Chest pain or chest pressure:    Shortness of breath upon exertion:    Short of breath when lying flat:    Irregular heart rhythm:        Vascular    Pain in calf, thigh, or hip brought on by ambulation:    Pain in feet at night that wakes you up from your sleep:     Blood clot in your veins:    Leg swelling:         Pulmonary    Oxygen at home:    Productive cough:     Wheezing:         Neurologic    Sudden weakness in arms or legs:     Sudden numbness in arms or legs:     Sudden onset of difficulty speaking or slurred speech:    Temporary loss of vision in one eye:     Problems with dizziness:         Gastrointestinal    Blood in stool:     Vomited blood:         Genitourinary    Burning when urinating:     Blood in urine:        Psychiatric    Major depression:         Hematologic    Bleeding problems:    Problems with blood clotting too easily:        Skin    Rashes or ulcers:        Constitutional    Fever or chills:      PHYSICAL EXAM: Vitals:   01/02/16 0843  BP: 133/66  Pulse: 62  Resp: 18  Temp: 97.8 F (36.6 C)  TempSrc: Oral  SpO2: 100%  Weight: 178 lb (80.7 kg)  Height: 6\' 1"  (1.854 m)    GENERAL: The patient is a well-nourished male, in no acute distress. The vital signs are documented above. CARDIAC: There is a regular rate and rhythm.  VASCULAR: I do not detect carotid bruits. He has a palpable left femoral pulse and a diminished but palpable left popliteal pulse.  PULMONARY: There is good air exchange bilaterally without wheezing or rales. ABDOMEN: Soft and non-tender with normal pitched bowel sounds.   MUSCULOSKELETAL: He has a right below the knee amputation.  NEUROLOGIC: No focal weakness or paresthesias are detected. SKIN: he has dry gangrene of the tip of his left great toe which measures about a centimeter in diameter. There is no erythema or drainage.  PSYCHIATRIC: The patient has a normal affect.  DATA:   VEIN MAP: I have independently interpreted his vein map today. The left great saphenous vein has diameters ranging from 0.31-0.43 cm.  MEDICAL ISSUES:  PERIPHERAL VASCULAR DISEASE AND DRY GANGRENE LEFT GREAT TOE: The wound on his left great toe is improving. His only option for revascularization would be a below-knee popliteal to peroneal artery bypass. He does not have patent vessels in the foot. The primary risk of bypass would be if the graft occluded the foot would be significantly worse given the interruption of collaterals. I'll see him back in 1 month. If the toe worsens that I would recommend attempting a pop peroneal bypass. If the toe is gradually improving and I think we will sit tight for now. He does have some mobility with this prosthesis and is very much against primary amputation.    [name] [facility] [place] [phone]

## 2016-01-03 DIAGNOSIS — M6281 Muscle weakness (generalized): Secondary | ICD-10-CM | POA: Diagnosis not present

## 2016-01-03 DIAGNOSIS — M24561 Contracture, right knee: Secondary | ICD-10-CM | POA: Diagnosis not present

## 2016-01-03 DIAGNOSIS — R262 Difficulty in walking, not elsewhere classified: Secondary | ICD-10-CM | POA: Diagnosis not present

## 2016-01-03 DIAGNOSIS — R279 Unspecified lack of coordination: Secondary | ICD-10-CM | POA: Diagnosis not present

## 2016-01-04 DIAGNOSIS — R279 Unspecified lack of coordination: Secondary | ICD-10-CM | POA: Diagnosis not present

## 2016-01-04 DIAGNOSIS — M24561 Contracture, right knee: Secondary | ICD-10-CM | POA: Diagnosis not present

## 2016-01-04 DIAGNOSIS — R262 Difficulty in walking, not elsewhere classified: Secondary | ICD-10-CM | POA: Diagnosis not present

## 2016-01-04 DIAGNOSIS — M6281 Muscle weakness (generalized): Secondary | ICD-10-CM | POA: Diagnosis not present

## 2016-01-07 DIAGNOSIS — M6281 Muscle weakness (generalized): Secondary | ICD-10-CM | POA: Diagnosis not present

## 2016-01-07 DIAGNOSIS — M24561 Contracture, right knee: Secondary | ICD-10-CM | POA: Diagnosis not present

## 2016-01-07 DIAGNOSIS — R262 Difficulty in walking, not elsewhere classified: Secondary | ICD-10-CM | POA: Diagnosis not present

## 2016-01-07 DIAGNOSIS — R279 Unspecified lack of coordination: Secondary | ICD-10-CM | POA: Diagnosis not present

## 2016-01-08 DIAGNOSIS — R279 Unspecified lack of coordination: Secondary | ICD-10-CM | POA: Diagnosis not present

## 2016-01-08 DIAGNOSIS — R262 Difficulty in walking, not elsewhere classified: Secondary | ICD-10-CM | POA: Diagnosis not present

## 2016-01-08 DIAGNOSIS — M6281 Muscle weakness (generalized): Secondary | ICD-10-CM | POA: Diagnosis not present

## 2016-01-08 DIAGNOSIS — M24561 Contracture, right knee: Secondary | ICD-10-CM | POA: Diagnosis not present

## 2016-01-09 DIAGNOSIS — R279 Unspecified lack of coordination: Secondary | ICD-10-CM | POA: Diagnosis not present

## 2016-01-09 DIAGNOSIS — M24561 Contracture, right knee: Secondary | ICD-10-CM | POA: Diagnosis not present

## 2016-01-09 DIAGNOSIS — R262 Difficulty in walking, not elsewhere classified: Secondary | ICD-10-CM | POA: Diagnosis not present

## 2016-01-09 DIAGNOSIS — M6281 Muscle weakness (generalized): Secondary | ICD-10-CM | POA: Diagnosis not present

## 2016-01-10 DIAGNOSIS — M6281 Muscle weakness (generalized): Secondary | ICD-10-CM | POA: Diagnosis not present

## 2016-01-10 DIAGNOSIS — M24561 Contracture, right knee: Secondary | ICD-10-CM | POA: Diagnosis not present

## 2016-01-10 DIAGNOSIS — R262 Difficulty in walking, not elsewhere classified: Secondary | ICD-10-CM | POA: Diagnosis not present

## 2016-01-10 DIAGNOSIS — R279 Unspecified lack of coordination: Secondary | ICD-10-CM | POA: Diagnosis not present

## 2016-01-11 DIAGNOSIS — M6281 Muscle weakness (generalized): Secondary | ICD-10-CM | POA: Diagnosis not present

## 2016-01-11 DIAGNOSIS — M24561 Contracture, right knee: Secondary | ICD-10-CM | POA: Diagnosis not present

## 2016-01-11 DIAGNOSIS — R262 Difficulty in walking, not elsewhere classified: Secondary | ICD-10-CM | POA: Diagnosis not present

## 2016-01-11 DIAGNOSIS — R279 Unspecified lack of coordination: Secondary | ICD-10-CM | POA: Diagnosis not present

## 2016-01-15 DIAGNOSIS — R262 Difficulty in walking, not elsewhere classified: Secondary | ICD-10-CM | POA: Diagnosis not present

## 2016-01-15 DIAGNOSIS — M6281 Muscle weakness (generalized): Secondary | ICD-10-CM | POA: Diagnosis not present

## 2016-01-15 DIAGNOSIS — M24561 Contracture, right knee: Secondary | ICD-10-CM | POA: Diagnosis not present

## 2016-01-15 DIAGNOSIS — R279 Unspecified lack of coordination: Secondary | ICD-10-CM | POA: Diagnosis not present

## 2016-01-16 DIAGNOSIS — M6281 Muscle weakness (generalized): Secondary | ICD-10-CM | POA: Diagnosis not present

## 2016-01-16 DIAGNOSIS — R262 Difficulty in walking, not elsewhere classified: Secondary | ICD-10-CM | POA: Diagnosis not present

## 2016-01-16 DIAGNOSIS — M24561 Contracture, right knee: Secondary | ICD-10-CM | POA: Diagnosis not present

## 2016-01-16 DIAGNOSIS — R279 Unspecified lack of coordination: Secondary | ICD-10-CM | POA: Diagnosis not present

## 2016-01-17 DIAGNOSIS — M24561 Contracture, right knee: Secondary | ICD-10-CM | POA: Diagnosis not present

## 2016-01-17 DIAGNOSIS — R279 Unspecified lack of coordination: Secondary | ICD-10-CM | POA: Diagnosis not present

## 2016-01-17 DIAGNOSIS — R262 Difficulty in walking, not elsewhere classified: Secondary | ICD-10-CM | POA: Diagnosis not present

## 2016-01-17 DIAGNOSIS — M6281 Muscle weakness (generalized): Secondary | ICD-10-CM | POA: Diagnosis not present

## 2016-01-18 DIAGNOSIS — M24561 Contracture, right knee: Secondary | ICD-10-CM | POA: Diagnosis not present

## 2016-01-18 DIAGNOSIS — M6281 Muscle weakness (generalized): Secondary | ICD-10-CM | POA: Diagnosis not present

## 2016-01-18 DIAGNOSIS — R262 Difficulty in walking, not elsewhere classified: Secondary | ICD-10-CM | POA: Diagnosis not present

## 2016-01-18 DIAGNOSIS — R279 Unspecified lack of coordination: Secondary | ICD-10-CM | POA: Diagnosis not present

## 2016-01-21 DIAGNOSIS — R279 Unspecified lack of coordination: Secondary | ICD-10-CM | POA: Diagnosis not present

## 2016-01-21 DIAGNOSIS — R262 Difficulty in walking, not elsewhere classified: Secondary | ICD-10-CM | POA: Diagnosis not present

## 2016-01-21 DIAGNOSIS — M6281 Muscle weakness (generalized): Secondary | ICD-10-CM | POA: Diagnosis not present

## 2016-01-21 DIAGNOSIS — M24561 Contracture, right knee: Secondary | ICD-10-CM | POA: Diagnosis not present

## 2016-01-22 DIAGNOSIS — R262 Difficulty in walking, not elsewhere classified: Secondary | ICD-10-CM | POA: Diagnosis not present

## 2016-01-22 DIAGNOSIS — R279 Unspecified lack of coordination: Secondary | ICD-10-CM | POA: Diagnosis not present

## 2016-01-22 DIAGNOSIS — M24561 Contracture, right knee: Secondary | ICD-10-CM | POA: Diagnosis not present

## 2016-01-22 DIAGNOSIS — M6281 Muscle weakness (generalized): Secondary | ICD-10-CM | POA: Diagnosis not present

## 2016-01-23 ENCOUNTER — Encounter (HOSPITAL_COMMUNITY): Payer: Self-pay | Admitting: Vascular Surgery

## 2016-01-23 DIAGNOSIS — M24561 Contracture, right knee: Secondary | ICD-10-CM | POA: Diagnosis not present

## 2016-01-23 DIAGNOSIS — R279 Unspecified lack of coordination: Secondary | ICD-10-CM | POA: Diagnosis not present

## 2016-01-23 DIAGNOSIS — M6281 Muscle weakness (generalized): Secondary | ICD-10-CM | POA: Diagnosis not present

## 2016-01-23 DIAGNOSIS — R262 Difficulty in walking, not elsewhere classified: Secondary | ICD-10-CM | POA: Diagnosis not present

## 2016-01-24 DIAGNOSIS — R279 Unspecified lack of coordination: Secondary | ICD-10-CM | POA: Diagnosis not present

## 2016-01-24 DIAGNOSIS — M6281 Muscle weakness (generalized): Secondary | ICD-10-CM | POA: Diagnosis not present

## 2016-01-24 DIAGNOSIS — M24561 Contracture, right knee: Secondary | ICD-10-CM | POA: Diagnosis not present

## 2016-01-24 DIAGNOSIS — R262 Difficulty in walking, not elsewhere classified: Secondary | ICD-10-CM | POA: Diagnosis not present

## 2016-01-25 DIAGNOSIS — R279 Unspecified lack of coordination: Secondary | ICD-10-CM | POA: Diagnosis not present

## 2016-01-25 DIAGNOSIS — R262 Difficulty in walking, not elsewhere classified: Secondary | ICD-10-CM | POA: Diagnosis not present

## 2016-01-25 DIAGNOSIS — M6281 Muscle weakness (generalized): Secondary | ICD-10-CM | POA: Diagnosis not present

## 2016-01-25 DIAGNOSIS — M24561 Contracture, right knee: Secondary | ICD-10-CM | POA: Diagnosis not present

## 2016-01-28 DIAGNOSIS — R262 Difficulty in walking, not elsewhere classified: Secondary | ICD-10-CM | POA: Diagnosis not present

## 2016-01-28 DIAGNOSIS — M6281 Muscle weakness (generalized): Secondary | ICD-10-CM | POA: Diagnosis not present

## 2016-01-28 DIAGNOSIS — R279 Unspecified lack of coordination: Secondary | ICD-10-CM | POA: Diagnosis not present

## 2016-01-28 DIAGNOSIS — M24561 Contracture, right knee: Secondary | ICD-10-CM | POA: Diagnosis not present

## 2016-01-29 DIAGNOSIS — M6281 Muscle weakness (generalized): Secondary | ICD-10-CM | POA: Diagnosis not present

## 2016-01-29 DIAGNOSIS — M24561 Contracture, right knee: Secondary | ICD-10-CM | POA: Diagnosis not present

## 2016-01-29 DIAGNOSIS — R279 Unspecified lack of coordination: Secondary | ICD-10-CM | POA: Diagnosis not present

## 2016-01-29 DIAGNOSIS — R262 Difficulty in walking, not elsewhere classified: Secondary | ICD-10-CM | POA: Diagnosis not present

## 2016-01-30 ENCOUNTER — Ambulatory Visit: Payer: Medicare Other | Admitting: Vascular Surgery

## 2016-01-30 ENCOUNTER — Encounter (HOSPITAL_COMMUNITY): Payer: Medicare Other

## 2016-01-30 DIAGNOSIS — R262 Difficulty in walking, not elsewhere classified: Secondary | ICD-10-CM | POA: Diagnosis not present

## 2016-01-30 DIAGNOSIS — M6281 Muscle weakness (generalized): Secondary | ICD-10-CM | POA: Diagnosis not present

## 2016-01-30 DIAGNOSIS — M24561 Contracture, right knee: Secondary | ICD-10-CM | POA: Diagnosis not present

## 2016-01-30 DIAGNOSIS — R279 Unspecified lack of coordination: Secondary | ICD-10-CM | POA: Diagnosis not present

## 2016-01-31 ENCOUNTER — Encounter (HOSPITAL_COMMUNITY): Payer: Medicare Other

## 2016-01-31 ENCOUNTER — Ambulatory Visit: Payer: Medicare Other | Admitting: Family

## 2016-01-31 DIAGNOSIS — R279 Unspecified lack of coordination: Secondary | ICD-10-CM | POA: Diagnosis not present

## 2016-01-31 DIAGNOSIS — R262 Difficulty in walking, not elsewhere classified: Secondary | ICD-10-CM | POA: Diagnosis not present

## 2016-01-31 DIAGNOSIS — M24561 Contracture, right knee: Secondary | ICD-10-CM | POA: Diagnosis not present

## 2016-01-31 DIAGNOSIS — M6281 Muscle weakness (generalized): Secondary | ICD-10-CM | POA: Diagnosis not present

## 2016-02-01 DIAGNOSIS — M6281 Muscle weakness (generalized): Secondary | ICD-10-CM | POA: Diagnosis not present

## 2016-02-01 DIAGNOSIS — R279 Unspecified lack of coordination: Secondary | ICD-10-CM | POA: Diagnosis not present

## 2016-02-01 DIAGNOSIS — M24561 Contracture, right knee: Secondary | ICD-10-CM | POA: Diagnosis not present

## 2016-02-01 DIAGNOSIS — R262 Difficulty in walking, not elsewhere classified: Secondary | ICD-10-CM | POA: Diagnosis not present

## 2016-02-04 DIAGNOSIS — M24561 Contracture, right knee: Secondary | ICD-10-CM | POA: Diagnosis not present

## 2016-02-04 DIAGNOSIS — R279 Unspecified lack of coordination: Secondary | ICD-10-CM | POA: Diagnosis not present

## 2016-02-04 DIAGNOSIS — M6281 Muscle weakness (generalized): Secondary | ICD-10-CM | POA: Diagnosis not present

## 2016-02-04 DIAGNOSIS — R262 Difficulty in walking, not elsewhere classified: Secondary | ICD-10-CM | POA: Diagnosis not present

## 2016-02-05 DIAGNOSIS — R262 Difficulty in walking, not elsewhere classified: Secondary | ICD-10-CM | POA: Diagnosis not present

## 2016-02-05 DIAGNOSIS — M24561 Contracture, right knee: Secondary | ICD-10-CM | POA: Diagnosis not present

## 2016-02-05 DIAGNOSIS — M6281 Muscle weakness (generalized): Secondary | ICD-10-CM | POA: Diagnosis not present

## 2016-02-05 DIAGNOSIS — R279 Unspecified lack of coordination: Secondary | ICD-10-CM | POA: Diagnosis not present

## 2016-02-06 DIAGNOSIS — R279 Unspecified lack of coordination: Secondary | ICD-10-CM | POA: Diagnosis not present

## 2016-02-06 DIAGNOSIS — M24561 Contracture, right knee: Secondary | ICD-10-CM | POA: Diagnosis not present

## 2016-02-06 DIAGNOSIS — R262 Difficulty in walking, not elsewhere classified: Secondary | ICD-10-CM | POA: Diagnosis not present

## 2016-02-06 DIAGNOSIS — M6281 Muscle weakness (generalized): Secondary | ICD-10-CM | POA: Diagnosis not present

## 2016-02-07 DIAGNOSIS — R279 Unspecified lack of coordination: Secondary | ICD-10-CM | POA: Diagnosis not present

## 2016-02-07 DIAGNOSIS — R262 Difficulty in walking, not elsewhere classified: Secondary | ICD-10-CM | POA: Diagnosis not present

## 2016-02-07 DIAGNOSIS — M24561 Contracture, right knee: Secondary | ICD-10-CM | POA: Diagnosis not present

## 2016-02-07 DIAGNOSIS — M6281 Muscle weakness (generalized): Secondary | ICD-10-CM | POA: Diagnosis not present

## 2016-02-08 DIAGNOSIS — M6281 Muscle weakness (generalized): Secondary | ICD-10-CM | POA: Diagnosis not present

## 2016-02-08 DIAGNOSIS — R279 Unspecified lack of coordination: Secondary | ICD-10-CM | POA: Diagnosis not present

## 2016-02-08 DIAGNOSIS — R262 Difficulty in walking, not elsewhere classified: Secondary | ICD-10-CM | POA: Diagnosis not present

## 2016-02-08 DIAGNOSIS — M24561 Contracture, right knee: Secondary | ICD-10-CM | POA: Diagnosis not present

## 2016-02-11 DIAGNOSIS — R279 Unspecified lack of coordination: Secondary | ICD-10-CM | POA: Diagnosis not present

## 2016-02-11 DIAGNOSIS — F29 Unspecified psychosis not due to a substance or known physiological condition: Secondary | ICD-10-CM | POA: Diagnosis not present

## 2016-02-11 DIAGNOSIS — F0390 Unspecified dementia without behavioral disturbance: Secondary | ICD-10-CM | POA: Diagnosis not present

## 2016-02-11 DIAGNOSIS — M6281 Muscle weakness (generalized): Secondary | ICD-10-CM | POA: Diagnosis not present

## 2016-02-11 DIAGNOSIS — M24561 Contracture, right knee: Secondary | ICD-10-CM | POA: Diagnosis not present

## 2016-02-11 DIAGNOSIS — F329 Major depressive disorder, single episode, unspecified: Secondary | ICD-10-CM | POA: Diagnosis not present

## 2016-02-11 DIAGNOSIS — E119 Type 2 diabetes mellitus without complications: Secondary | ICD-10-CM | POA: Diagnosis not present

## 2016-02-11 DIAGNOSIS — F419 Anxiety disorder, unspecified: Secondary | ICD-10-CM | POA: Diagnosis not present

## 2016-02-11 DIAGNOSIS — I7389 Other specified peripheral vascular diseases: Secondary | ICD-10-CM | POA: Diagnosis not present

## 2016-02-11 DIAGNOSIS — R262 Difficulty in walking, not elsewhere classified: Secondary | ICD-10-CM | POA: Diagnosis not present

## 2016-02-11 DIAGNOSIS — I10 Essential (primary) hypertension: Secondary | ICD-10-CM | POA: Diagnosis not present

## 2016-02-12 DIAGNOSIS — M6281 Muscle weakness (generalized): Secondary | ICD-10-CM | POA: Diagnosis not present

## 2016-02-12 DIAGNOSIS — M24561 Contracture, right knee: Secondary | ICD-10-CM | POA: Diagnosis not present

## 2016-02-12 DIAGNOSIS — R262 Difficulty in walking, not elsewhere classified: Secondary | ICD-10-CM | POA: Diagnosis not present

## 2016-02-12 DIAGNOSIS — R279 Unspecified lack of coordination: Secondary | ICD-10-CM | POA: Diagnosis not present

## 2016-02-13 DIAGNOSIS — M24561 Contracture, right knee: Secondary | ICD-10-CM | POA: Diagnosis not present

## 2016-02-13 DIAGNOSIS — R262 Difficulty in walking, not elsewhere classified: Secondary | ICD-10-CM | POA: Diagnosis not present

## 2016-02-13 DIAGNOSIS — R279 Unspecified lack of coordination: Secondary | ICD-10-CM | POA: Diagnosis not present

## 2016-02-13 DIAGNOSIS — M6281 Muscle weakness (generalized): Secondary | ICD-10-CM | POA: Diagnosis not present

## 2016-02-14 DIAGNOSIS — M6281 Muscle weakness (generalized): Secondary | ICD-10-CM | POA: Diagnosis not present

## 2016-02-14 DIAGNOSIS — R262 Difficulty in walking, not elsewhere classified: Secondary | ICD-10-CM | POA: Diagnosis not present

## 2016-02-14 DIAGNOSIS — M24561 Contracture, right knee: Secondary | ICD-10-CM | POA: Diagnosis not present

## 2016-02-14 DIAGNOSIS — R279 Unspecified lack of coordination: Secondary | ICD-10-CM | POA: Diagnosis not present

## 2016-02-15 DIAGNOSIS — R262 Difficulty in walking, not elsewhere classified: Secondary | ICD-10-CM | POA: Diagnosis not present

## 2016-02-15 DIAGNOSIS — M24561 Contracture, right knee: Secondary | ICD-10-CM | POA: Diagnosis not present

## 2016-02-15 DIAGNOSIS — R279 Unspecified lack of coordination: Secondary | ICD-10-CM | POA: Diagnosis not present

## 2016-02-15 DIAGNOSIS — M6281 Muscle weakness (generalized): Secondary | ICD-10-CM | POA: Diagnosis not present

## 2016-02-18 DIAGNOSIS — M6281 Muscle weakness (generalized): Secondary | ICD-10-CM | POA: Diagnosis not present

## 2016-02-18 DIAGNOSIS — M24561 Contracture, right knee: Secondary | ICD-10-CM | POA: Diagnosis not present

## 2016-02-18 DIAGNOSIS — R262 Difficulty in walking, not elsewhere classified: Secondary | ICD-10-CM | POA: Diagnosis not present

## 2016-02-18 DIAGNOSIS — R279 Unspecified lack of coordination: Secondary | ICD-10-CM | POA: Diagnosis not present

## 2016-02-19 DIAGNOSIS — R262 Difficulty in walking, not elsewhere classified: Secondary | ICD-10-CM | POA: Diagnosis not present

## 2016-02-19 DIAGNOSIS — R279 Unspecified lack of coordination: Secondary | ICD-10-CM | POA: Diagnosis not present

## 2016-02-19 DIAGNOSIS — M6281 Muscle weakness (generalized): Secondary | ICD-10-CM | POA: Diagnosis not present

## 2016-02-19 DIAGNOSIS — M24561 Contracture, right knee: Secondary | ICD-10-CM | POA: Diagnosis not present

## 2016-02-20 ENCOUNTER — Encounter: Payer: Self-pay | Admitting: Vascular Surgery

## 2016-02-20 DIAGNOSIS — M24561 Contracture, right knee: Secondary | ICD-10-CM | POA: Diagnosis not present

## 2016-02-20 DIAGNOSIS — M6281 Muscle weakness (generalized): Secondary | ICD-10-CM | POA: Diagnosis not present

## 2016-02-20 DIAGNOSIS — R279 Unspecified lack of coordination: Secondary | ICD-10-CM | POA: Diagnosis not present

## 2016-02-20 DIAGNOSIS — R262 Difficulty in walking, not elsewhere classified: Secondary | ICD-10-CM | POA: Diagnosis not present

## 2016-02-21 DIAGNOSIS — R279 Unspecified lack of coordination: Secondary | ICD-10-CM | POA: Diagnosis not present

## 2016-02-21 DIAGNOSIS — M6281 Muscle weakness (generalized): Secondary | ICD-10-CM | POA: Diagnosis not present

## 2016-02-21 DIAGNOSIS — R262 Difficulty in walking, not elsewhere classified: Secondary | ICD-10-CM | POA: Diagnosis not present

## 2016-02-21 DIAGNOSIS — M24561 Contracture, right knee: Secondary | ICD-10-CM | POA: Diagnosis not present

## 2016-02-22 DIAGNOSIS — M6281 Muscle weakness (generalized): Secondary | ICD-10-CM | POA: Diagnosis not present

## 2016-02-22 DIAGNOSIS — R279 Unspecified lack of coordination: Secondary | ICD-10-CM | POA: Diagnosis not present

## 2016-02-22 DIAGNOSIS — R262 Difficulty in walking, not elsewhere classified: Secondary | ICD-10-CM | POA: Diagnosis not present

## 2016-02-22 DIAGNOSIS — M24561 Contracture, right knee: Secondary | ICD-10-CM | POA: Diagnosis not present

## 2016-02-25 ENCOUNTER — Other Ambulatory Visit: Payer: Self-pay | Admitting: *Deleted

## 2016-02-25 DIAGNOSIS — F329 Major depressive disorder, single episode, unspecified: Secondary | ICD-10-CM | POA: Diagnosis not present

## 2016-02-25 DIAGNOSIS — I739 Peripheral vascular disease, unspecified: Secondary | ICD-10-CM

## 2016-02-25 DIAGNOSIS — F29 Unspecified psychosis not due to a substance or known physiological condition: Secondary | ICD-10-CM | POA: Diagnosis not present

## 2016-02-25 DIAGNOSIS — F419 Anxiety disorder, unspecified: Secondary | ICD-10-CM | POA: Diagnosis not present

## 2016-02-25 DIAGNOSIS — F0390 Unspecified dementia without behavioral disturbance: Secondary | ICD-10-CM | POA: Diagnosis not present

## 2016-02-27 ENCOUNTER — Ambulatory Visit (HOSPITAL_COMMUNITY)
Admission: RE | Admit: 2016-02-27 | Discharge: 2016-02-27 | Disposition: A | Discharge status: Home / Self Care | Payer: Medicare Other | Source: Ambulatory Visit | Attending: Vascular Surgery | Admitting: Vascular Surgery

## 2016-02-27 ENCOUNTER — Encounter: Payer: Self-pay | Admitting: Vascular Surgery

## 2016-02-27 ENCOUNTER — Ambulatory Visit (INDEPENDENT_AMBULATORY_CARE_PROVIDER_SITE_OTHER): Payer: Medicare Other | Admitting: Vascular Surgery

## 2016-02-27 VITALS — BP 150/72 | HR 64 | Temp 98.6°F | Ht 73.0 in | Wt 178.0 lb

## 2016-02-27 DIAGNOSIS — I739 Peripheral vascular disease, unspecified: Secondary | ICD-10-CM

## 2016-02-27 DIAGNOSIS — I70209 Unspecified atherosclerosis of native arteries of extremities, unspecified extremity: Secondary | ICD-10-CM

## 2016-02-27 NOTE — Progress Notes (Signed)
Patient name: Terry Green MRN: [id number] DOB: 09/25/1940 Sex: male  REASON FOR VISIT: Follow up of left great toe wound.  HPI: Terry Green is a 75 y.o. male who I last saw on 01/02/2016. He has undergone a previous right below-knee amputation on 06/06/2014. He subsequently presented with a wound on his left great toe. He underwent an arteriogram which showed severe tibial artery occlusive disease. The only patent vessel for potential bypass was the peroneal artery in the mid leg. The left great toe wound was improving and we were following this. At the time of his last visit we also map his left great saphenous vein which had diameters ranging from 0.31-0.43 cm.  Since I saw him last, the wound on the toe has gradually improved. He denies significant drainage or cellulitis. He denies fever.   Current Outpatient Prescriptions  Medication Sig Dispense Refill  . alum & mag hydroxide-simeth (MYLANTA) 200-200-20 MG/5ML suspension Take 15 mLs by mouth every 6 (six) hours as needed for indigestion or heartburn.    Marland Kitchen amLODipine (NORVASC) 10 MG tablet Take 10 mg by mouth at bedtime.     Marland Kitchen aspirin 81 MG chewable tablet Chew 81 mg by mouth daily.    . busPIRone (BUSPAR) 15 MG tablet Take 15 mg by mouth at bedtime.    . carvedilol (COREG) 12.5 MG tablet Take 12.5 mg by mouth 2 (two) times daily with a meal.    . clopidogrel (PLAVIX) 75 MG tablet Take 75 mg by mouth daily.    . divalproex (DEPAKOTE ER) 250 MG 24 hr tablet Take 250 mg by mouth 2 (two) times daily.    Marland Kitchen gabapentin (NEURONTIN) 300 MG capsule Take 300 mg by mouth 3 (three) times daily.    Marland Kitchen HYDROcodone-acetaminophen (NORCO) 7.5-325 MG per tablet Take 1 tablet by mouth every 4 (four) hours as needed for moderate pain. 30 tablet 0  . insulin aspart (NOVOLOG) 100 UNIT/ML FlexPen Inject 0-20 Units into the skin 2 (two) times daily. 6:30am and 4pm per sliding scale:  CBG 150-249 3 units, 250-349 5 units, 350-449 8 units, 450-549 14 units,  550-551 20 units >551 call MD    . Insulin Glargine (LANTUS) 100 UNIT/ML Solostar Pen Inject 15 Units into the skin at bedtime.     Marland Kitchen loratadine (CLARITIN) 10 MG tablet Take 10 mg by mouth daily.    Marland Kitchen LORazepam (ATIVAN) 1 MG tablet Take 1 mg by mouth every 12 (twelve) hours as needed.     . magnesium hydroxide (MILK OF MAGNESIA) 400 MG/5ML suspension Take 30 mLs by mouth daily as needed for mild constipation.    . memantine (NAMENDA) 10 MG tablet Take 10 mg by mouth 2 (two) times daily.    . metFORMIN (GLUCOPHAGE) 1000 MG tablet Take 1,000 mg by mouth 2 (two) times daily with a meal.     . Multiple Vitamins-Minerals (MULTIVITAMIN WITH MINERALS) tablet Take 1 tablet by mouth daily.    Marland Kitchen oxybutynin (DITROPAN-XL) 10 MG 24 hr tablet Take 10 mg by mouth at bedtime.    . risperiDONE (RISPERDAL) 0.5 MG tablet Take 0.5 mg by mouth at bedtime.    . sertraline (ZOLOFT) 100 MG tablet Take 100 mg by mouth daily.    . tamsulosin (FLOMAX) 0.4 MG CAPS capsule Take 0.4 mg by mouth at bedtime.     . vitamin C (ASCORBIC ACID) 500 MG tablet Take 500 mg by mouth 2 (two) times daily.     No  current facility-administered medications for this visit.     REVIEW OF SYSTEMS:  [X]  denotes positive finding, [ ]  denotes negative finding Cardiac  Comments:  Chest pain or chest pressure:    Shortness of breath upon exertion:    Short of breath when lying flat:    Irregular heart rhythm:    Constitutional    Fever or chills:      PHYSICAL EXAM: Vitals:   02/27/16 1049 02/27/16 1050  BP: (!) 146/71 (!) 150/72  Pulse: 64   Temp: 98.6 F (37 C)   TempSrc: Oral   SpO2: 100%   Weight: 178 lb (80.7 kg)   Height: 6\' 1"  (1.854 m)     GENERAL: The patient is a well-nourished male, in no acute distress. The vital signs are documented above. CARDIOVASCULAR: There is a regular rate and rhythm. PULMONARY: There is good air exchange bilaterally without wheezing or rales. He has a femoral and popliteal pulse on the  left. The eschar on the tip of the left great toe is about a centimeter and a half in diameter. There is no significant drainage or erythema.  MEDICAL ISSUES:  LEFT GREAT TOE WOUND WITH SEVERE TIBIAL ARTERY OCCLUSIVE DISEASE: Given that the wound and the toes gradually improving we will hold off on popliteal to peroneal artery bypass. This would be his only option if the toe progresses. He'll continue soaking the foot daily in lukewarm dial soap soaks. I'll see him back in 2 months. He knows to call sooner if he has problems.  02/29/16 [facility] [place] [phone]

## 2016-02-29 DIAGNOSIS — R946 Abnormal results of thyroid function studies: Secondary | ICD-10-CM | POA: Diagnosis not present

## 2016-02-29 DIAGNOSIS — Z130 Encounter for screening for diseases of the blood and blood-forming organs and certain disorders involving the immune mechanism: Secondary | ICD-10-CM | POA: Diagnosis not present

## 2016-03-18 DIAGNOSIS — F0390 Unspecified dementia without behavioral disturbance: Secondary | ICD-10-CM | POA: Diagnosis not present

## 2016-03-18 DIAGNOSIS — F329 Major depressive disorder, single episode, unspecified: Secondary | ICD-10-CM | POA: Diagnosis not present

## 2016-03-18 DIAGNOSIS — F419 Anxiety disorder, unspecified: Secondary | ICD-10-CM | POA: Diagnosis not present

## 2016-03-18 DIAGNOSIS — F29 Unspecified psychosis not due to a substance or known physiological condition: Secondary | ICD-10-CM | POA: Diagnosis not present

## 2016-03-19 DIAGNOSIS — R945 Abnormal results of liver function studies: Secondary | ICD-10-CM | POA: Diagnosis not present

## 2016-03-27 DIAGNOSIS — I7389 Other specified peripheral vascular diseases: Secondary | ICD-10-CM | POA: Diagnosis not present

## 2016-03-27 DIAGNOSIS — I10 Essential (primary) hypertension: Secondary | ICD-10-CM | POA: Diagnosis not present

## 2016-03-27 DIAGNOSIS — E119 Type 2 diabetes mellitus without complications: Secondary | ICD-10-CM | POA: Diagnosis not present

## 2016-03-31 DIAGNOSIS — F329 Major depressive disorder, single episode, unspecified: Secondary | ICD-10-CM | POA: Diagnosis not present

## 2016-03-31 DIAGNOSIS — F0390 Unspecified dementia without behavioral disturbance: Secondary | ICD-10-CM | POA: Diagnosis not present

## 2016-03-31 DIAGNOSIS — F419 Anxiety disorder, unspecified: Secondary | ICD-10-CM | POA: Diagnosis not present

## 2016-03-31 DIAGNOSIS — F29 Unspecified psychosis not due to a substance or known physiological condition: Secondary | ICD-10-CM | POA: Diagnosis not present

## 2016-04-09 DIAGNOSIS — R7301 Impaired fasting glucose: Secondary | ICD-10-CM | POA: Diagnosis not present

## 2016-04-09 DIAGNOSIS — R790 Abnormal level of blood mineral: Secondary | ICD-10-CM | POA: Diagnosis not present

## 2016-04-09 DIAGNOSIS — I10 Essential (primary) hypertension: Secondary | ICD-10-CM | POA: Diagnosis not present

## 2016-04-09 DIAGNOSIS — E785 Hyperlipidemia, unspecified: Secondary | ICD-10-CM | POA: Diagnosis not present

## 2016-04-09 DIAGNOSIS — F445 Conversion disorder with seizures or convulsions: Secondary | ICD-10-CM | POA: Diagnosis not present

## 2016-04-21 DIAGNOSIS — F419 Anxiety disorder, unspecified: Secondary | ICD-10-CM | POA: Diagnosis not present

## 2016-04-21 DIAGNOSIS — F329 Major depressive disorder, single episode, unspecified: Secondary | ICD-10-CM | POA: Diagnosis not present

## 2016-04-21 DIAGNOSIS — F29 Unspecified psychosis not due to a substance or known physiological condition: Secondary | ICD-10-CM | POA: Diagnosis not present

## 2016-04-21 DIAGNOSIS — F0390 Unspecified dementia without behavioral disturbance: Secondary | ICD-10-CM | POA: Diagnosis not present

## 2016-04-22 DIAGNOSIS — E1151 Type 2 diabetes mellitus with diabetic peripheral angiopathy without gangrene: Secondary | ICD-10-CM | POA: Diagnosis not present

## 2016-04-24 ENCOUNTER — Encounter: Payer: Self-pay | Admitting: Vascular Surgery

## 2016-04-30 ENCOUNTER — Encounter: Payer: Self-pay | Admitting: Vascular Surgery

## 2016-04-30 ENCOUNTER — Ambulatory Visit (INDEPENDENT_AMBULATORY_CARE_PROVIDER_SITE_OTHER): Payer: Medicare Other | Admitting: Vascular Surgery

## 2016-04-30 VITALS — BP 123/70 | HR 65 | Temp 97.9°F | Resp 18 | Ht 73.0 in | Wt 175.0 lb

## 2016-04-30 DIAGNOSIS — I70245 Atherosclerosis of native arteries of left leg with ulceration of other part of foot: Secondary | ICD-10-CM | POA: Diagnosis not present

## 2016-04-30 NOTE — Progress Notes (Signed)
Patient name: Terry Green MRN: [id number] DOB: Aug 05, 1940 Sex: male  REASON FOR VISIT: Follow up of left great toe wound.  HPI: Terry Green is a 76 y.o. male who I last saw on 02/27/2016. 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 His only potential target was a peroneal artery in the mid calf. When I saw him last on 02/27/2016, the wound on the toes gradually improving and therefore we were holding off on potential popliteal to peroneal artery bypass. I instructed him to continue soaking the foot and lukewarm Dial soap soaks and he comes in for a 2 month follow up visit.  Since I saw him last, the toe wound has gradually improved. There is no significant drainage or erythema. They have been soaking the foot daily in Luke warm dilute soap soaks. He continues to ambulate and does well with his prosthesis. He is not a smoker.  Past Medical History:  Diagnosis Date  . Arthritis    "hands" (06/06/2014)  . Bipolar disorder (HCC)   . Hypercholesteremia   . Hypertension   . PAD (peripheral artery disease) (HCC)   . Peripheral vascular disease (HCC)   . Rheumatoid arthritis (HCC)   . Type II diabetes mellitus (HCC)     Family History  Problem Relation Age of Onset  . Heart disease Father     SOCIAL HISTORY: Social History  Substance Use Topics  . Smoking status: Former Smoker    Packs/day: 1.00    Years: 54.00    Types: Cigarettes    Quit date: 05/25/2012  . Smokeless tobacco: Never Used  . Alcohol use 0.0 oz/week     Comment: 06/06/2014 "I drank 1/2 pint whiskey each day;  nothing in 20 yrs"    Allergies  Allergen Reactions  . Lisinopril Swelling    Current Outpatient Prescriptions  Medication Sig Dispense Refill  . alum & mag hydroxide-simeth (MYLANTA) 200-200-20 MG/5ML suspension Take 15 mLs by mouth every 6 (six) hours as  needed for indigestion or heartburn.    Marland Kitchen amLODipine (NORVASC) 10 MG tablet Take 10 mg by mouth at bedtime.     Marland Kitchen aspirin 81 MG chewable tablet Chew 81 mg by mouth daily.    . busPIRone (BUSPAR) 15 MG tablet Take 15 mg by mouth at bedtime.    . carvedilol (COREG) 12.5 MG tablet Take 12.5 mg by mouth 2 (two) times daily with a meal.    . clopidogrel (PLAVIX) 75 MG tablet Take 75 mg by mouth daily.    . divalproex (DEPAKOTE ER) 250 MG 24 hr tablet Take 250 mg by mouth 2 (two) times daily.    Marland Kitchen gabapentin (NEURONTIN) 300 MG capsule Take 300 mg by mouth 3 (three) times daily.    Marland Kitchen HYDROcodone-acetaminophen (NORCO) 7.5-325 MG per tablet Take 1 tablet by mouth every 4 (four) hours as needed for moderate pain. 30 tablet 0  . insulin aspart (NOVOLOG) 100 UNIT/ML FlexPen Inject 0-20 Units into the skin 2 (two) times daily. 6:30am and 4pm per sliding scale:  CBG 150-249 3 units, 250-349 5 units, 350-449 8 units, 450-549 14 units, 550-551 20 units >551 call MD    . Insulin Glargine (LANTUS) 100 UNIT/ML Solostar Pen Inject 15 Units into the skin at bedtime.     Marland Kitchen loratadine (CLARITIN) 10 MG tablet Take 10 mg by mouth daily.    Marland Kitchen  LORazepam (ATIVAN) 1 MG tablet Take 1 mg by mouth every 12 (twelve) hours as needed.     . magnesium hydroxide (MILK OF MAGNESIA) 400 MG/5ML suspension Take 30 mLs by mouth daily as needed for mild constipation.    . memantine (NAMENDA) 10 MG tablet Take 10 mg by mouth 2 (two) times daily.    . metFORMIN (GLUCOPHAGE) 1000 MG tablet Take 1,000 mg by mouth 2 (two) times daily with a meal.     . Multiple Vitamins-Minerals (MULTIVITAMIN WITH MINERALS) tablet Take 1 tablet by mouth daily.    Marland Kitchen oxybutynin (DITROPAN-XL) 10 MG 24 hr tablet Take 10 mg by mouth at bedtime.    . risperiDONE (RISPERDAL) 0.5 MG tablet Take 0.5 mg by mouth at bedtime.    . sertraline (ZOLOFT) 100 MG tablet Take 100 mg by mouth daily.    . tamsulosin (FLOMAX) 0.4 MG CAPS capsule Take 0.4 mg by mouth at bedtime.       . vitamin C (ASCORBIC ACID) 500 MG tablet Take 500 mg by mouth 2 (two) times daily.     No current facility-administered medications for this visit.     REVIEW OF SYSTEMS:  [X]  denotes positive finding, [ ]  denotes negative finding Cardiac  Comments:  Chest pain or chest pressure:    Shortness of breath upon exertion:    Short of breath when lying flat:    Irregular heart rhythm:        Vascular    Pain in calf, thigh, or hip brought on by ambulation:    Pain in feet at night that wakes you up from your sleep:     Blood clot in your veins:    Leg swelling:         Pulmonary    Oxygen at home:    Productive cough:     Wheezing:         Neurologic    Sudden weakness in arms or legs:     Sudden numbness in arms or legs:     Sudden onset of difficulty speaking or slurred speech:    Temporary loss of vision in one eye:     Problems with dizziness:         Gastrointestinal    Blood in stool:     Vomited blood:         Genitourinary    Burning when urinating:     Blood in urine:        Psychiatric    Major depression:         Hematologic    Bleeding problems:    Problems with blood clotting too easily:        Skin    Rashes or ulcers:        Constitutional    Fever or chills:      PHYSICAL EXAM: Vitals:   04/30/16 0903  BP: 123/70  Pulse: 65  Resp: 18  Temp: 97.9 F (36.6 C)  TempSrc: Oral  SpO2: 96%  Weight: 175 lb (79.4 kg)  Height: 6\' 1"  (1.854 m)    GENERAL: The patient is a well-nourished male, in no acute distress. The vital signs are documented above. CARDIAC: There is a regular rate and rhythm.  VASCULAR: I cannot palpate pulses in the left foot. He has known severe tibial artery occlusive disease. PULMONARY: There is good air exchange bilaterally without wheezing or rales. ABDOMEN: Soft and non-tender with normal pitched bowel sounds.  MUSCULOSKELETAL: He has a  right below the knee amputation. NEUROLOGIC: No focal weakness or paresthesias  are detected. SKIN: There is an eschar on the tip of the left great toe. There is no significant drainage or erythema associated with this. PSYCHIATRIC: The patient has a normal affect.  MEDICAL ISSUES:  LEFT GREAT TOE ULCER WITH SEVERE TIBIAL ARTERY OCCLUSIVE DISEASE: The toe wound continues to gradually improve. His only option for revascularization would be a popliteal to peroneal artery bypass. I've explained that this would be associated with some risk and, given that we would have to interrupt the collaterals if the graft occluded and then the foot would worsen rapidly. Given that the wound is gradually improving and he would like to avoid surgery, we will continue to follow this. I have instructed him to continue to soak the foot daily in lukewarm dye SOAP soaks. I've encouraged him to stay active. Fortunately he is not a smoker. I'll see him back in 6 months. We'll get ABIs at that time. He knows to call sooner if he has problems.  [name] [facility] [place] [phone]

## 2016-05-08 NOTE — Addendum Note (Signed)
Addended by: Burton Apley A on: 05/08/2016 12:57 PM   Modules accepted: Orders

## 2016-05-12 DIAGNOSIS — I10 Essential (primary) hypertension: Secondary | ICD-10-CM | POA: Diagnosis not present

## 2016-05-12 DIAGNOSIS — E119 Type 2 diabetes mellitus without complications: Secondary | ICD-10-CM | POA: Diagnosis not present

## 2016-05-12 DIAGNOSIS — I7389 Other specified peripheral vascular diseases: Secondary | ICD-10-CM | POA: Diagnosis not present

## 2016-06-16 ENCOUNTER — Telehealth: Payer: Self-pay

## 2016-06-16 DIAGNOSIS — L97529 Non-pressure chronic ulcer of other part of left foot with unspecified severity: Secondary | ICD-10-CM

## 2016-06-16 DIAGNOSIS — I739 Peripheral vascular disease, unspecified: Secondary | ICD-10-CM

## 2016-06-16 DIAGNOSIS — M79605 Pain in left leg: Secondary | ICD-10-CM

## 2016-06-16 NOTE — Telephone Encounter (Signed)
Phone call from [place] at [facility].  Reported the pt's. Left great toe continues to have drainage.  Stated today it was noted that the drainage has some odor to it.  Unsure of the color of the drainage.  Also, reported the pt. c/o increased pain in the toe.  Denied any fever/ chills. Is requesting an appt. Much sooner than the 6 mo. f/u.  Advised will consult with Dr. Edilia Bo re: need for vasc. Lab.  Will call back with appt. Information.  Agreed.

## 2016-06-17 NOTE — Telephone Encounter (Signed)
RE: need for ABI's  Received: Today  Message Contents  Chuck Hint, MD  Smitty Pluck Gabrelle Roca, RN        If he has not had ABI's within the last 3 months, he would need ABI's  Thanks.  CD

## 2016-06-18 NOTE — Telephone Encounter (Signed)
Tried to get the patient seen sooner but Kathie Rhodes stated that she is booked due to dialysis transport. The patient is scheduled 4/27 @ 8 am.

## 2016-06-19 ENCOUNTER — Telehealth: Payer: Self-pay

## 2016-06-19 NOTE — Telephone Encounter (Signed)
rec'd phone call from the Wound Nurse @ [facility]  Reported the pt's left great toe is worse with increased drainage, and odor; stated it is gangrenous.  Reported the Dial Foot Soaks are not helping.  Is asking about changing to Aquaphil AG to the wound left gr. Toe? Advised it would be okay to change to the Aquaphil AG to left great toe, and that will move pt's appts. to earlier date; appt. given for 06/23/16 @ 10:00 AM.  Spoke with the [employer]; confirmed appt.

## 2016-06-23 ENCOUNTER — Ambulatory Visit (HOSPITAL_COMMUNITY)
Admission: RE | Admit: 2016-06-23 | Discharge: 2016-06-23 | Disposition: A | Discharge status: Home / Self Care | Payer: Medicare Other | Source: Ambulatory Visit | Attending: Vascular Surgery | Admitting: Vascular Surgery

## 2016-06-23 ENCOUNTER — Ambulatory Visit (INDEPENDENT_AMBULATORY_CARE_PROVIDER_SITE_OTHER): Payer: Medicare Other | Admitting: Family

## 2016-06-23 ENCOUNTER — Encounter: Payer: Self-pay | Admitting: Family

## 2016-06-23 VITALS — BP 140/72 | HR 64 | Temp 97.4°F | Resp 16 | Ht 73.0 in | Wt 175.0 lb

## 2016-06-23 DIAGNOSIS — I739 Peripheral vascular disease, unspecified: Secondary | ICD-10-CM

## 2016-06-23 DIAGNOSIS — Z87891 Personal history of nicotine dependence: Secondary | ICD-10-CM

## 2016-06-23 DIAGNOSIS — M79605 Pain in left leg: Secondary | ICD-10-CM | POA: Diagnosis not present

## 2016-06-23 DIAGNOSIS — Z89511 Acquired absence of right leg below knee: Secondary | ICD-10-CM | POA: Diagnosis not present

## 2016-06-23 DIAGNOSIS — I70202 Unspecified atherosclerosis of native arteries of extremities, left leg: Secondary | ICD-10-CM | POA: Diagnosis not present

## 2016-06-23 DIAGNOSIS — I70245 Atherosclerosis of native arteries of left leg with ulceration of other part of foot: Secondary | ICD-10-CM | POA: Diagnosis not present

## 2016-06-23 DIAGNOSIS — L97529 Non-pressure chronic ulcer of other part of left foot with unspecified severity: Secondary | ICD-10-CM

## 2016-06-23 NOTE — Patient Instructions (Signed)

## 2016-06-23 NOTE — Progress Notes (Signed)
[facility] [place]   CC: Evaluate worsening left great toe ulcer  History of Present Illness Terry Green is a 76 y.o. male patient of Dr. 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 In October 2017 he presented with a wound on his left great toe and underwent arteriogram which showed severe tibial artery occlusive disease. His only potential target was a peroneal artery in the mid calf.   When Dr. Edilia Bo saw him on 02/27/2016, the wound on the toe was gradually improving and therefore we were holding off on potential popliteal to peroneal artery bypass. Dr. Edilia Bo instructed him to continue soaking the foot in lukewarm Dial soap soaks and to come in for a 2 month follow up visit.  Dr. Edilia Bo last evaluated pt on 04-30-16. At that time the pt had left great toe ulcer with severe tibial artery occlusive disease. The left great toe wound continued to gradually improve. His only option for revascularization would be a popliteal to peroneal artery bypass. Dr. Edilia Bo explained to the pt at that time that this would be associated with some risk and, given that we would have to interrupt the collaterals if the graft occluded and then the foot would worsen rapidly. Given that the wound is gradually improving and he would like to avoid surgery, we will continue to follow this. Dr. Ferne Coe instructed pt at that visit to continue to soak the foot daily in lukewarm Dial soap soaks and encouraged him to stay active. Fortunately he is not a smoker. Pt was to return in 6 months with ABIs at that time. He was advised to call sooner if he has problems.  Pt returns today after rec'd phone call from the Wound Nurse @ [facility].  Reported the pt's left great toe is worse with increased drainage, and odor; stated it is gangrenous.  Reported the Dial Foot Soaks are not helping.  Is asking about changing to Aquaphil AG to the wound left gr. Toe? Advised it would be okay to  change to the Aquaphil AG to left great toe, and that will move pt's appts. to earlier date; appt. given for 06/23/16 @ 10:00 AM.    Pt denies fever or chills. He states he started having pain in his left big toe about 2 weeks ago. The wound nurse noted an odor to the open wound at the tip of the left great toe 06-16-16. Pt states he walks twice daily with his walker, wearing his right BKA prosthesis, 15 minutes each walk.   Pt denies any history of stroke or TIA.   Pt Diabetic: Yes Pt smoker: former smoker, quit in 2014, smoked x 54 years  Pt meds include: Statin :no Betablocker: Yes ASA: Yes Other anticoagulants/antiplatelets: Plavix  Past Medical History:  Diagnosis Date  . Arthritis    "hands" (06/06/2014)  . Bipolar disorder (HCC)   . Hypercholesteremia   . Hypertension   . PAD (peripheral artery disease) (HCC)   . Peripheral vascular disease (HCC)   . Rheumatoid arthritis (HCC)   . Type II diabetes mellitus (HCC)     Social History Social History  Substance Use Topics  . Smoking status: Former Smoker    Packs/day: 1.00    Years: 54.00    Types: Cigarettes    Quit date: 05/25/2012  . Smokeless tobacco: Never Used  . Alcohol use 0.0 oz/week     Comment: 06/06/2014 "I drank 1/2 pint whiskey each day;  nothing  in 20 yrs"    Family History Family History  Problem Relation Age of Onset  . Heart disease Father     Past Surgical History:  Procedure Laterality Date  . AMPUTATION Right 06/06/2014   Procedure: AMPUTATION BELOW KNEE;  Surgeon: Chuck Hint, MD;  Location: [facility] OR;  Service: Vascular;  Laterality: Right;  . BALLOON ANGIOPLASTY, ARTERY Right    attempted balloon angioplasty and stenting of the anterior tibial artery that apparently was unsuccessful. [name] 05/26/2014  . 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 KNEE ARTHROSCOPY Right 1970   [place]  . PERIPHERAL VASCULAR CATHETERIZATION N/A 12/21/2015   Procedure: Abdominal Aortogram;  Surgeon:  Sherren Kerns, MD;  Location: [facility] INVASIVE CV LAB;  Service: Cardiovascular;  Laterality: N/A;  . TRANSURETHRAL RESECTION OF PROSTATE  2012    Allergies  Allergen Reactions  . Lisinopril Swelling    Current Outpatient Prescriptions  Medication Sig Dispense Refill  . alum & mag hydroxide-simeth (MYLANTA) 200-200-20 MG/5ML suspension Take 15 mLs by mouth every 6 (six) hours as needed for indigestion or heartburn.    Marland Kitchen amLODipine (NORVASC) 10 MG tablet Take 10 mg by mouth at bedtime.     Marland Kitchen aspirin 81 MG chewable tablet Chew 81 mg by mouth daily.    . busPIRone (BUSPAR) 15 MG tablet Take 15 mg by mouth at bedtime.    . carvedilol (COREG) 12.5 MG tablet Take 12.5 mg by mouth 2 (two) times daily with a meal.    . clopidogrel (PLAVIX) 75 MG tablet Take 75 mg by mouth daily.    . divalproex (DEPAKOTE ER) 250 MG 24 hr tablet Take 250 mg by mouth 2 (two) times daily.    Marland Kitchen gabapentin (NEURONTIN) 300 MG capsule Take 300 mg by mouth 3 (three) times daily.    Marland Kitchen HYDROcodone-acetaminophen (NORCO) 7.5-325 MG per tablet Take 1 tablet by mouth every 4 (four) hours as needed for moderate pain. 30 tablet 0  . insulin aspart (NOVOLOG) 100 UNIT/ML FlexPen Inject 0-20 Units into the skin 2 (two) times daily. 6:30am and 4pm per sliding scale:  CBG 150-249 3 units, 250-349 5 units, 350-449 8 units, 450-549 14 units, 550-551 20 units >551 call MD    . Insulin Glargine (LANTUS) 100 UNIT/ML Solostar Pen Inject 15 Units into the skin at bedtime.     Marland Kitchen loratadine (CLARITIN) 10 MG tablet Take 10 mg by mouth daily.    Marland Kitchen LORazepam (ATIVAN) 1 MG tablet Take 1 mg by mouth every 12 (twelve) hours as needed.     . magnesium hydroxide (MILK OF MAGNESIA) 400 MG/5ML suspension Take 30 mLs by mouth daily as needed for mild constipation.    . memantine (NAMENDA) 10 MG tablet Take 10 mg by mouth 2 (two) times daily.    . metFORMIN (GLUCOPHAGE) 1000 MG tablet Take 1,000 mg by mouth 2 (two) times daily with a meal.     . Multiple  Vitamins-Minerals (MULTIVITAMIN WITH MINERALS) tablet Take 1 tablet by mouth daily.    Marland Kitchen oxybutynin (DITROPAN-XL) 10 MG 24 hr tablet Take 10 mg by mouth at bedtime.    . risperiDONE (RISPERDAL) 0.5 MG tablet Take 0.5 mg by mouth at bedtime.    . sertraline (ZOLOFT) 100 MG tablet Take 100 mg by mouth daily.    . tamsulosin (FLOMAX) 0.4 MG CAPS capsule Take 0.4 mg by mouth at bedtime.     . vitamin C (ASCORBIC ACID) 500 MG tablet Take 500 mg by mouth  2 (two) times daily.     No current facility-administered medications for this visit.     ROS: See HPI for pertinent positives and negatives.   Physical Examination  Vitals:   06/23/16 1010  BP: 140/72  Pulse: 64  Resp: 16  Temp: 97.4 F (36.3 C)  TempSrc: Oral  SpO2: 100%  Weight: 175 lb (79.4 kg)  Height: 6\' 1"  (1.854 m)   Body mass index is 23.09 kg/m.  General: A&O x 3, WDWN, male. Gait: seated in w/c Eyes: PERRLA. Pulmonary: Respirations are non labored, CTAB, good air movement Cardiac: regular rhythm, no detected murmur.         Carotid Bruits Right Left   Negative Negative  Aorta is not palpable. Radial pulses: 2+ palpable bilaterally                           VASCULAR EXAM: Extremities with ischemic changes open wound at tip of left great toe, left great toe is moderately swollen with mild dark discoloration.  without Gangrene; with open wounds: tip of left great toe with purulent foul smelling material, not draining, but in open wound that measures about 7 mm x 7 mm with surrounding callus.                                                                                                           LE Pulses Right Left       FEMORAL  3+ palpable  3+ palpable        POPLITEAL  not palpable   not palpable       POSTERIOR TIBIAL BKA   monophasic by Doppler and not palpable        DORSALIS PEDIS      ANTERIOR TIBIAL BKA monophasic by Doppler and not palpable    Abdomen: soft, NT, no palpable masses. Skin: no  rashes, See Extremities. Musculoskeletal: no muscle wasting or atrophy.  Neurologic: A&O X 3; Appropriate Affect ; SENSATION: normal; MOTOR FUNCTION:  moving all extremities equally, motor strength 4/5 throughout. Speech is fluent/normal. CN 2-12 intact.    Non-Invasive Vascular Imaging: DATE: 06/23/2016 ABI:  RIGHT: BKA LEFT: 0.70 (DP), 0.59 peroneal, 0.53 PT, Waveforms: monophasic.    ASSESSMENT: Terry Green is a 76 y.o. 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 In October 2017 he presented with a wound on his left great toe and underwent arteriogram which showed severe tibial artery occlusive disease. His only potential target was a peroneal artery in the mid calf.   The ulcer at the tip of his left great toe had been slowly healing until about 06-16-16 when the wound care nurse at the [facility] noted an odor to the wound.  They requested to uses Aquaphil with Ag after 06-16-16 and this was approved by our office.  The toe ulcer is not painful to pt unless the wound is manipulated, at least here in the office.  The 7 x 7 mm wound at the tip  of his left great toe has a foul odor with purulent thick material in the wound. The toe proximal to the wound is moderately swollen with moderately dark discoloration.  Left ABI of 0.7 is probably falsely elevated since waveforms are monophasic.   0.8 serum creatinine on 12-24-15.   Will start on po Levaquin. Concern for possible osteomyelitis, see Plan.   PLAN:  Based on the patient's vascular studies and examination, and after discussing with Dr. Myra Gianotti, pt will be started on Levaquin 500 mg once daily x 2 weeks, return to clinic at Dr. Adele Dan soonest available to evaluate options, concern for possible osteomyelitis.  Continue Aquaphil Ag dressing changes, increase to bid from qd.   I discussed in depth with the patient the nature of atherosclerosis, and emphasized the importance of maximal medical management including  strict control of blood pressure, blood glucose, and lipid levels, obtaining regular exercise, and continued cessation of smoking.  The patient is aware that without maximal medical management the underlying atherosclerotic disease process will progress, limiting the benefit of any interventions.  The patient was given information about PAD including signs, symptoms, treatment, what symptoms should prompt the patient to seek immediate medical care, and risk reduction measures to take.  Charisse March, RN, MSN, FNP-C [facility] [place] Phone: [phone]  Clinic MD: Myra Gianotti  06/23/16 10:35 AM

## 2016-06-25 DIAGNOSIS — F29 Unspecified psychosis not due to a substance or known physiological condition: Secondary | ICD-10-CM | POA: Diagnosis not present

## 2016-06-25 DIAGNOSIS — F329 Major depressive disorder, single episode, unspecified: Secondary | ICD-10-CM | POA: Diagnosis not present

## 2016-06-25 DIAGNOSIS — F419 Anxiety disorder, unspecified: Secondary | ICD-10-CM | POA: Diagnosis not present

## 2016-06-25 DIAGNOSIS — F0390 Unspecified dementia without behavioral disturbance: Secondary | ICD-10-CM | POA: Diagnosis not present

## 2016-07-01 ENCOUNTER — Ambulatory Visit: Payer: Medicare Other | Admitting: Family

## 2016-07-01 ENCOUNTER — Encounter (HOSPITAL_COMMUNITY): Payer: Medicare Other

## 2016-07-01 DIAGNOSIS — E1121 Type 2 diabetes mellitus with diabetic nephropathy: Secondary | ICD-10-CM | POA: Diagnosis not present

## 2016-07-01 DIAGNOSIS — R262 Difficulty in walking, not elsewhere classified: Secondary | ICD-10-CM | POA: Diagnosis not present

## 2016-07-01 DIAGNOSIS — M24561 Contracture, right knee: Secondary | ICD-10-CM | POA: Diagnosis not present

## 2016-07-01 DIAGNOSIS — R279 Unspecified lack of coordination: Secondary | ICD-10-CM | POA: Diagnosis not present

## 2016-07-02 ENCOUNTER — Encounter: Payer: Self-pay | Admitting: Vascular Surgery

## 2016-07-07 DIAGNOSIS — F319 Bipolar disorder, unspecified: Secondary | ICD-10-CM | POA: Diagnosis not present

## 2016-07-07 DIAGNOSIS — F339 Major depressive disorder, recurrent, unspecified: Secondary | ICD-10-CM | POA: Diagnosis not present

## 2016-07-09 ENCOUNTER — Encounter: Payer: Self-pay | Admitting: Vascular Surgery

## 2016-07-09 ENCOUNTER — Other Ambulatory Visit: Payer: Self-pay

## 2016-07-09 ENCOUNTER — Ambulatory Visit (INDEPENDENT_AMBULATORY_CARE_PROVIDER_SITE_OTHER): Payer: Medicare Other | Admitting: Vascular Surgery

## 2016-07-09 VITALS — BP 129/73 | HR 69 | Temp 97.0°F | Resp 18 | Ht 73.0 in | Wt 175.0 lb

## 2016-07-09 DIAGNOSIS — I739 Peripheral vascular disease, unspecified: Secondary | ICD-10-CM

## 2016-07-09 DIAGNOSIS — I70245 Atherosclerosis of native arteries of left leg with ulceration of other part of foot: Secondary | ICD-10-CM | POA: Diagnosis not present

## 2016-07-09 DIAGNOSIS — L97529 Non-pressure chronic ulcer of other part of left foot with unspecified severity: Secondary | ICD-10-CM

## 2016-07-09 MED ORDER — LEVOFLOXACIN 500 MG PO TABS: 500.0000 mg | ORAL_TABLET | Freq: Every day | ORAL | 1 refills | Status: DC

## 2016-07-09 NOTE — Progress Notes (Signed)
Patient name: Terry Green MRN: [id number] DOB: 1940-11-29 Sex: male  REASON FOR VISIT: Follow up of left great toe wound.  HPI: Terry Green is a 76 y.o. male who I last saw on 04/30/2016. Patient is had a previous right below-the-knee amputation in March 2016. He presented with a wound on the left great toe and underwent an arteriogram which showed severe tibial artery occlusive disease. His only potential target for bypass was a peroneal artery. At the time of his last visit with me, the toe was gradually improving. He comes in for a follow up visit.  The patient then had some drainage from the toe and was started on Levaquin by our nurse practitioner. He comes in for follow up visit. He denies fever.  They have not been soaking the foot.   Current Outpatient Prescriptions  Medication Sig Dispense Refill  . alum & mag hydroxide-simeth (MYLANTA) 200-200-20 MG/5ML suspension Take 15 mLs by mouth every 6 (six) hours as needed for indigestion or heartburn.    Marland Kitchen amLODipine (NORVASC) 10 MG tablet Take 10 mg by mouth at bedtime.     Marland Kitchen aspirin 81 MG chewable tablet Chew 81 mg by mouth daily.    . busPIRone (BUSPAR) 15 MG tablet Take 15 mg by mouth at bedtime.    . carvedilol (COREG) 12.5 MG tablet Take 12.5 mg by mouth 2 (two) times daily with a meal.    . clopidogrel (PLAVIX) 75 MG tablet Take 75 mg by mouth daily.    . divalproex (DEPAKOTE ER) 250 MG 24 hr tablet Take 250 mg by mouth 2 (two) times daily.    Marland Kitchen gabapentin (NEURONTIN) 300 MG capsule Take 300 mg by mouth 3 (three) times daily.    Marland Kitchen HYDROcodone-acetaminophen (NORCO) 7.5-325 MG per tablet Take 1 tablet by mouth every 4 (four) hours as needed for moderate pain. 30 tablet 0  . insulin aspart (NOVOLOG) 100 UNIT/ML FlexPen Inject 0-20 Units into the skin 2 (two) times daily. 6:30am and 4pm per sliding scale:  CBG 150-249 3 units, 250-349 5 units, 350-449 8 units, 450-549 14 units, 550-551 20 units >551 call MD    . Insulin Glargine  (LANTUS) 100 UNIT/ML Solostar Pen Inject 15 Units into the skin at bedtime.     Marland Kitchen loratadine (CLARITIN) 10 MG tablet Take 10 mg by mouth daily.    Marland Kitchen LORazepam (ATIVAN) 1 MG tablet Take 1 mg by mouth every 12 (twelve) hours as needed.     . magnesium hydroxide (MILK OF MAGNESIA) 400 MG/5ML suspension Take 30 mLs by mouth daily as needed for mild constipation.    . memantine (NAMENDA) 10 MG tablet Take 10 mg by mouth 2 (two) times daily.    . metFORMIN (GLUCOPHAGE) 1000 MG tablet Take 1,000 mg by mouth 2 (two) times daily with a meal.     . Multiple Vitamins-Minerals (MULTIVITAMIN WITH MINERALS) tablet Take 1 tablet by mouth daily.    Marland Kitchen oxybutynin (DITROPAN-XL) 10 MG 24 hr tablet Take 10 mg by mouth at bedtime.    . risperiDONE (RISPERDAL) 0.5 MG tablet Take 0.5 mg by mouth at bedtime.    . sertraline (ZOLOFT) 100 MG tablet Take 100 mg by mouth daily.    . tamsulosin (FLOMAX) 0.4 MG CAPS capsule Take 0.4 mg by mouth at bedtime.     . vitamin C (ASCORBIC ACID) 500 MG tablet Take 500 mg by mouth 2 (two) times daily.     No current facility-administered medications  for this visit.     REVIEW OF SYSTEMS:  [X]  denotes positive finding, [ ]  denotes negative finding Cardiac  Comments:  Chest pain or chest pressure:    Shortness of breath upon exertion:    Short of breath when lying flat:    Irregular heart rhythm:    Constitutional    Fever or chills:      PHYSICAL EXAM: Vitals:   07/09/16 1317  BP: 129/73  Pulse: 69  Resp: 18  Temp: 97 F (36.1 C)  TempSrc: Oral  SpO2: 98%  Weight: 175 lb (79.4 kg)  Height: 6\' 1"  (1.854 m)    GENERAL: The patient is a well-nourished male, in no acute distress. The vital signs are documented above. CARDIOVASCULAR: There is a regular rate and rhythm. PULMONARY: There is good air exchange bilaterally without wheezing or rales. The patient has a peroneal signal with the Doppler. He has slight drainage from the left great toe with no  erythema.  MEDICAL ISSUES:  NONHEALING WOUND LEFT GREAT TOE WITH SEVERE TIBIAL ARTERY OCCLUSIVE DISEASE:  We have cultured his toe today and also I have restarted him on Levaquin. I've also instructed the nursing facility to soak the foot daily in lukewarm diet soap soaks. If the toe does not improve then the only remaining option for attempted limb salvage would be a popliteal to peroneal artery bypass. His last arteriogram was on 12/21/2015 so it is probably not necessary to repeat this at this point. He also had a vein map which showed he did have some vein in the left lower extremity.  07/11/16 [facility] [place] [phone]

## 2016-07-11 ENCOUNTER — Ambulatory Visit: Payer: Medicare Other | Admitting: Family

## 2016-07-11 ENCOUNTER — Encounter (HOSPITAL_COMMUNITY): Payer: Medicare Other

## 2016-07-16 ENCOUNTER — Ambulatory Visit: Payer: Medicare Other | Admitting: Vascular Surgery

## 2016-07-18 DIAGNOSIS — E1121 Type 2 diabetes mellitus with diabetic nephropathy: Secondary | ICD-10-CM | POA: Diagnosis not present

## 2016-07-18 DIAGNOSIS — M19072 Primary osteoarthritis, left ankle and foot: Secondary | ICD-10-CM | POA: Diagnosis not present

## 2016-07-18 DIAGNOSIS — L97521 Non-pressure chronic ulcer of other part of left foot limited to breakdown of skin: Secondary | ICD-10-CM | POA: Diagnosis not present

## 2016-07-18 DIAGNOSIS — I96 Gangrene, not elsewhere classified: Secondary | ICD-10-CM | POA: Diagnosis not present

## 2016-07-18 DIAGNOSIS — B351 Tinea unguium: Secondary | ICD-10-CM | POA: Diagnosis not present

## 2016-07-21 ENCOUNTER — Telehealth: Payer: Self-pay | Admitting: *Deleted

## 2016-07-21 NOTE — Telephone Encounter (Signed)
Received a FYI message from Amy at [facility], that a podiatrist (Dr.Jah [phone]) saw Mr Villella and has prescribed Santyl for his toe wound. I tried to call Amy back but she has gone for the day and the [facility] operator told me to call her back tomorrow. Will call tomorrow for more details.  Patient saw Dr. Edilia Bo on 07-09-16, plan was as follows:   We have cultured his toe today and also I have restarted him on Levaquin. I've also instructed the nursing facility to soak the foot daily in lukewarm diet soap soaks. If the toe does not improve then the only remaining option for attempted limb salvage would be a popliteal to peroneal artery bypass. His last arteriogram was on 12/21/2015 so it is probably not necessary to repeat this at this point. He also had a vein map which showed he did have some vein in the left lower extremity.

## 2016-07-29 ENCOUNTER — Encounter: Payer: Self-pay | Admitting: Vascular Surgery

## 2016-07-29 DIAGNOSIS — M24561 Contracture, right knee: Secondary | ICD-10-CM | POA: Diagnosis not present

## 2016-07-29 DIAGNOSIS — L97222 Non-pressure chronic ulcer of left calf with fat layer exposed: Secondary | ICD-10-CM | POA: Diagnosis not present

## 2016-07-29 DIAGNOSIS — R262 Difficulty in walking, not elsewhere classified: Secondary | ICD-10-CM | POA: Diagnosis not present

## 2016-07-29 DIAGNOSIS — L97521 Non-pressure chronic ulcer of other part of left foot limited to breakdown of skin: Secondary | ICD-10-CM | POA: Diagnosis not present

## 2016-07-29 DIAGNOSIS — R279 Unspecified lack of coordination: Secondary | ICD-10-CM | POA: Diagnosis not present

## 2016-07-29 DIAGNOSIS — E1121 Type 2 diabetes mellitus with diabetic nephropathy: Secondary | ICD-10-CM | POA: Diagnosis not present

## 2016-07-30 DIAGNOSIS — S91302A Unspecified open wound, left foot, initial encounter: Secondary | ICD-10-CM | POA: Diagnosis not present

## 2016-07-31 DIAGNOSIS — M24561 Contracture, right knee: Secondary | ICD-10-CM | POA: Diagnosis not present

## 2016-07-31 DIAGNOSIS — M6281 Muscle weakness (generalized): Secondary | ICD-10-CM | POA: Diagnosis not present

## 2016-07-31 DIAGNOSIS — R262 Difficulty in walking, not elsewhere classified: Secondary | ICD-10-CM | POA: Diagnosis not present

## 2016-07-31 DIAGNOSIS — M79675 Pain in left toe(s): Secondary | ICD-10-CM | POA: Diagnosis not present

## 2016-07-31 DIAGNOSIS — R279 Unspecified lack of coordination: Secondary | ICD-10-CM | POA: Diagnosis not present

## 2016-07-31 DIAGNOSIS — R2689 Other abnormalities of gait and mobility: Secondary | ICD-10-CM | POA: Diagnosis not present

## 2016-08-01 DIAGNOSIS — M24561 Contracture, right knee: Secondary | ICD-10-CM | POA: Diagnosis not present

## 2016-08-01 DIAGNOSIS — R262 Difficulty in walking, not elsewhere classified: Secondary | ICD-10-CM | POA: Diagnosis not present

## 2016-08-01 DIAGNOSIS — R2689 Other abnormalities of gait and mobility: Secondary | ICD-10-CM | POA: Diagnosis not present

## 2016-08-01 DIAGNOSIS — M79675 Pain in left toe(s): Secondary | ICD-10-CM | POA: Diagnosis not present

## 2016-08-01 DIAGNOSIS — R279 Unspecified lack of coordination: Secondary | ICD-10-CM | POA: Diagnosis not present

## 2016-08-01 DIAGNOSIS — M6281 Muscle weakness (generalized): Secondary | ICD-10-CM | POA: Diagnosis not present

## 2016-08-04 DIAGNOSIS — R279 Unspecified lack of coordination: Secondary | ICD-10-CM | POA: Diagnosis not present

## 2016-08-04 DIAGNOSIS — M6281 Muscle weakness (generalized): Secondary | ICD-10-CM | POA: Diagnosis not present

## 2016-08-04 DIAGNOSIS — M24561 Contracture, right knee: Secondary | ICD-10-CM | POA: Diagnosis not present

## 2016-08-04 DIAGNOSIS — R2689 Other abnormalities of gait and mobility: Secondary | ICD-10-CM | POA: Diagnosis not present

## 2016-08-04 DIAGNOSIS — R262 Difficulty in walking, not elsewhere classified: Secondary | ICD-10-CM | POA: Diagnosis not present

## 2016-08-04 DIAGNOSIS — M79675 Pain in left toe(s): Secondary | ICD-10-CM | POA: Diagnosis not present

## 2016-08-05 DIAGNOSIS — M79675 Pain in left toe(s): Secondary | ICD-10-CM | POA: Diagnosis not present

## 2016-08-05 DIAGNOSIS — R2689 Other abnormalities of gait and mobility: Secondary | ICD-10-CM | POA: Diagnosis not present

## 2016-08-05 DIAGNOSIS — M6281 Muscle weakness (generalized): Secondary | ICD-10-CM | POA: Diagnosis not present

## 2016-08-05 DIAGNOSIS — R262 Difficulty in walking, not elsewhere classified: Secondary | ICD-10-CM | POA: Diagnosis not present

## 2016-08-05 DIAGNOSIS — R279 Unspecified lack of coordination: Secondary | ICD-10-CM | POA: Diagnosis not present

## 2016-08-05 DIAGNOSIS — M24561 Contracture, right knee: Secondary | ICD-10-CM | POA: Diagnosis not present

## 2016-08-06 ENCOUNTER — Encounter: Payer: Self-pay | Admitting: Vascular Surgery

## 2016-08-06 ENCOUNTER — Ambulatory Visit (INDEPENDENT_AMBULATORY_CARE_PROVIDER_SITE_OTHER): Payer: Medicare Other | Admitting: Vascular Surgery

## 2016-08-06 VITALS — BP 127/71 | HR 61 | Temp 98.1°F | Resp 20 | Ht 73.0 in | Wt 175.0 lb

## 2016-08-06 DIAGNOSIS — R2689 Other abnormalities of gait and mobility: Secondary | ICD-10-CM | POA: Diagnosis not present

## 2016-08-06 DIAGNOSIS — M6281 Muscle weakness (generalized): Secondary | ICD-10-CM | POA: Diagnosis not present

## 2016-08-06 DIAGNOSIS — L089 Local infection of the skin and subcutaneous tissue, unspecified: Secondary | ICD-10-CM | POA: Diagnosis not present

## 2016-08-06 DIAGNOSIS — I739 Peripheral vascular disease, unspecified: Secondary | ICD-10-CM | POA: Diagnosis not present

## 2016-08-06 DIAGNOSIS — M24561 Contracture, right knee: Secondary | ICD-10-CM | POA: Diagnosis not present

## 2016-08-06 DIAGNOSIS — I70245 Atherosclerosis of native arteries of left leg with ulceration of other part of foot: Secondary | ICD-10-CM | POA: Diagnosis not present

## 2016-08-06 DIAGNOSIS — R262 Difficulty in walking, not elsewhere classified: Secondary | ICD-10-CM | POA: Diagnosis not present

## 2016-08-06 DIAGNOSIS — M79675 Pain in left toe(s): Secondary | ICD-10-CM | POA: Diagnosis not present

## 2016-08-06 DIAGNOSIS — R279 Unspecified lack of coordination: Secondary | ICD-10-CM | POA: Diagnosis not present

## 2016-08-06 NOTE — Progress Notes (Signed)
**Note Terry-Identified via Obfuscation** Patient name: DEMORRIS Green MRN: [id number] DOB: 1940/08/02 Sex: male  REASON FOR VISIT:    Follow up of left great toe wound.  HPI:   Terry Green is a 76 y.o. male who I last saw on 07/09/2016. He has had a previous right below the knee amputation. He presented with a wound on the left great toe and underwent an arteriogram which showed severe tibial artery occlusive disease. His only potential target for bypass was the peroneal artery. At the time of his last visit he had some slight drainage from the left great toe with no erythema. I started him on Levaquin. I instructed the nursing facility to soak the foot daily and lukewarm dial soap soaks. If the wound did not improve the only option would be a popliteal to peroneal artery bypass based on arteriogram that was done 7 months ago.  Since I saw him last, the wound on the left great toe has worsened. He has a large wound on the left great toe. He denies fever or chills. He has been soaking the foot and has been on po antibiotics.  He denies any history of myocardial infarction or history of congestive heart failure. He denies any chest pain or chest pressure.  Past Medical History:  Diagnosis Date  . Arthritis    "hands" (06/06/2014)  . Bipolar disorder (HCC)   . Hypercholesteremia   . Hypertension   . PAD (peripheral artery disease) (HCC)   . Peripheral vascular disease (HCC)   . Rheumatoid arthritis (HCC)   . Type II diabetes mellitus (HCC)     Family History  Problem Relation Age of Onset  . Heart disease Father     SOCIAL HISTORY: Social History  Substance Use Topics  . Smoking status: Former Smoker    Packs/day: 1.00    Years: 54.00    Types: Cigarettes    Quit date: 05/25/2012  . Smokeless tobacco: Never Used  . Alcohol use 0.0 oz/week     Comment: 06/06/2014 "I drank 1/2 pint whiskey each day;  nothing in 20 yrs"    Allergies  Allergen Reactions  . Lisinopril Swelling    Current Outpatient Prescriptions   Medication Sig Dispense Refill  . amLODipine (NORVASC) 10 MG tablet Take 10 mg by mouth at bedtime.     Marland Kitchen aspirin 81 MG chewable tablet Chew 81 mg by mouth daily.    . busPIRone (BUSPAR) 15 MG tablet Take 15 mg by mouth at bedtime.    . carvedilol (COREG) 12.5 MG tablet Take 12.5 mg by mouth 2 (two) times daily with a meal.    . clopidogrel (PLAVIX) 75 MG tablet Take 75 mg by mouth daily.    . divalproex (DEPAKOTE ER) 250 MG 24 hr tablet Take 250 mg by mouth 2 (two) times daily.    Marland Kitchen doxycycline (DORYX) 100 MG EC tablet Take 100 mg by mouth 2 (two) times daily.    Marland Kitchen gabapentin (NEURONTIN) 300 MG capsule Take 300 mg by mouth 3 (three) times daily.    Marland Kitchen HYDROcodone-acetaminophen (NORCO) 7.5-325 MG per tablet Take 1 tablet by mouth every 4 (four) hours as needed for moderate pain. 30 tablet 0  . insulin aspart (NOVOLOG) 100 UNIT/ML FlexPen Inject 0-20 Units into the skin 2 (two) times daily. 6:30am and 4pm per sliding scale:  CBG 150-249 3 units, 250-349 5 units, 350-449 8 units, 450-549 14 units, 550-551 20 units >551 call MD    . Insulin Glargine (LANTUS) 100  UNIT/ML Solostar Pen Inject 15 Units into the skin at bedtime.     . levofloxacin (LEVAQUIN) 500 MG tablet Take 1 tablet (500 mg total) by mouth daily. 14 tablet 1  . loratadine (CLARITIN) 10 MG tablet Take 10 mg by mouth daily.    . memantine (NAMENDA) 10 MG tablet Take 10 mg by mouth 2 (two) times daily.    . metFORMIN (GLUCOPHAGE) 1000 MG tablet Take 1,000 mg by mouth 2 (two) times daily with a meal.     . Multiple Vitamins-Minerals (MULTIVITAMIN WITH MINERALS) tablet Take 1 tablet by mouth daily.    . oxybutynin (DITROPAN-XL) 10 MG 24 hr tablet Take 10 mg by mouth at bedtime.    . risperiDONE (RISPERDAL) 0.5 MG tablet Take 0.5 mg by mouth at bedtime.    . sertraline (ZOLOFT) 100 MG tablet Take 100 mg by mouth daily.    . sulfamethoxazole-trimethoprim (BACTRIM DS,SEPTRA DS) 800-160 MG tablet Take 1 tablet by mouth 2 (two) times daily.      . tamsulosin (FLOMAX) 0.4 MG CAPS capsule Take 0.4 mg by mouth at bedtime.     . vitamin C (ASCORBIC ACID) 500 MG tablet Take 500 mg by mouth 2 (two) times daily.    . alum & mag hydroxide-simeth (MYLANTA) 200-200-20 MG/5ML suspension Take 15 mLs by mouth every 6 (six) hours as needed for indigestion or heartburn.    . LORazepam (ATIVAN) 1 MG tablet Take 1 mg by mouth every 12 (twelve) hours as needed.     . magnesium hydroxide (MILK OF MAGNESIA) 400 MG/5ML suspension Take 30 mLs by mouth daily as needed for mild constipation.     No current facility-administered medications for this visit.     REVIEW OF SYSTEMS:  [X] denotes positive finding, [ ] denotes negative finding Cardiac  Comments:  Chest pain or chest pressure:    Shortness of breath upon exertion:    Short of breath when lying flat:    Irregular heart rhythm:        Vascular    Pain in calf, thigh, or hip brought on by ambulation:    Pain in feet at night that wakes you up from your sleep:     Blood clot in your veins:    Leg swelling:         Pulmonary    Oxygen at home:    Productive cough:     Wheezing:         Neurologic    Sudden weakness in arms or legs:     Sudden numbness in arms or legs:     Sudden onset of difficulty speaking or slurred speech:    Temporary loss of vision in one eye:     Problems with dizziness:         Gastrointestinal    Blood in stool:     Vomited blood:         Genitourinary    Burning when urinating:     Blood in urine:        Psychiatric    Major depression:         Hematologic    Bleeding problems:    Problems with blood clotting too easily:        Skin    Rashes or ulcers: X Left great toe       Constitutional    Fever or chills:     PHYSICAL EXAM:   Vitals:   08/06/16 0945  BP: 127/71    Pulse: 61  Resp: 20  Temp: 98.1 F (36.7 C)  TempSrc: Oral  SpO2: 98%  Weight: 175 lb (79.4 kg)  Height: 6\' 1"  (1.854 m)    GENERAL: The patient is a well-nourished  male, in no acute distress. The vital signs are documented above. CARDIAC: There is a regular rate and rhythm.  VASCULAR: I do not detect carotid bruits. On the left side, he has a palpable femoral and popliteal pulse. He has a peroneal signal with the Doppler. PULMONARY: There is good air exchange bilaterally without wheezing or rales. ABDOMEN: Soft and non-tender with normal pitched bowel sounds.  MUSCULOSKELETAL: He has a right below the knee amputation. NEUROLOGIC: No focal weakness or paresthesias are detected. SKIN: He has a large wound on his left great toe which is not improving. PSYCHIATRIC: The patient has a normal affect.  DATA:    ARTERIOGRAM:  I have reviewed the arteriogram it was performed by Dr. fields on 12/21/2015. This shows occlusion of the popliteal artery below the knee with single-vessel runoff via the peroneal artery which reconstitutes in the mid calf.  VEIN MAP: I did review his vein map that was done on 12/28/2015. This shows that the saphenous vein appears to be reasonable in size throughout the thigh down to the distal calf.  MEDICAL ISSUES:   CRITICAL LIMB ISCHEMIA LEFT LOWER EXTREMITY: This patient presents with an extensive nonhealing wound of the left great toe and severe tibial artery occlusive disease. I think that without revascularization he will require a below the knee amputation on the left. He has a below the knee amputation on the right and is ambulatory with a prosthesis. He would like to try to save the left limb if at all possible so that he can remain ambulatory and I think that this is perfectly reasonable. I think his best chance for limb salvage would be a popliteal to peroneal artery bypass with a vein graft.   I have reviewed the indications for lower extremity bypass. I have also reviewed the potential complications of surgery including but not limited to: wound healing problems, infection, graft thrombosis, limb loss, or other  unpredictable medical problems. All the patient's questions were answered and they are agreeable to proceed. We will hold his Plavix 5 days prior to the procedure. His surgery is scheduled for 08/19/2016.   10/19/2016 [facility] [phone]

## 2016-08-07 DIAGNOSIS — M6281 Muscle weakness (generalized): Secondary | ICD-10-CM | POA: Diagnosis not present

## 2016-08-07 DIAGNOSIS — M24561 Contracture, right knee: Secondary | ICD-10-CM | POA: Diagnosis not present

## 2016-08-07 DIAGNOSIS — R279 Unspecified lack of coordination: Secondary | ICD-10-CM | POA: Diagnosis not present

## 2016-08-07 DIAGNOSIS — M79675 Pain in left toe(s): Secondary | ICD-10-CM | POA: Diagnosis not present

## 2016-08-07 DIAGNOSIS — R262 Difficulty in walking, not elsewhere classified: Secondary | ICD-10-CM | POA: Diagnosis not present

## 2016-08-07 DIAGNOSIS — R2689 Other abnormalities of gait and mobility: Secondary | ICD-10-CM | POA: Diagnosis not present

## 2016-08-08 DIAGNOSIS — R279 Unspecified lack of coordination: Secondary | ICD-10-CM | POA: Diagnosis not present

## 2016-08-08 DIAGNOSIS — S91302A Unspecified open wound, left foot, initial encounter: Secondary | ICD-10-CM | POA: Diagnosis not present

## 2016-08-08 DIAGNOSIS — R262 Difficulty in walking, not elsewhere classified: Secondary | ICD-10-CM | POA: Diagnosis not present

## 2016-08-08 DIAGNOSIS — R2689 Other abnormalities of gait and mobility: Secondary | ICD-10-CM | POA: Diagnosis not present

## 2016-08-08 DIAGNOSIS — M24561 Contracture, right knee: Secondary | ICD-10-CM | POA: Diagnosis not present

## 2016-08-08 DIAGNOSIS — M79675 Pain in left toe(s): Secondary | ICD-10-CM | POA: Diagnosis not present

## 2016-08-08 DIAGNOSIS — M6281 Muscle weakness (generalized): Secondary | ICD-10-CM | POA: Diagnosis not present

## 2016-08-11 DIAGNOSIS — R2689 Other abnormalities of gait and mobility: Secondary | ICD-10-CM | POA: Diagnosis not present

## 2016-08-11 DIAGNOSIS — M24561 Contracture, right knee: Secondary | ICD-10-CM | POA: Diagnosis not present

## 2016-08-11 DIAGNOSIS — R279 Unspecified lack of coordination: Secondary | ICD-10-CM | POA: Diagnosis not present

## 2016-08-11 DIAGNOSIS — M6281 Muscle weakness (generalized): Secondary | ICD-10-CM | POA: Diagnosis not present

## 2016-08-11 DIAGNOSIS — R262 Difficulty in walking, not elsewhere classified: Secondary | ICD-10-CM | POA: Diagnosis not present

## 2016-08-11 DIAGNOSIS — M79675 Pain in left toe(s): Secondary | ICD-10-CM | POA: Diagnosis not present

## 2016-08-12 ENCOUNTER — Other Ambulatory Visit: Payer: Self-pay | Admitting: *Deleted

## 2016-08-12 DIAGNOSIS — M79675 Pain in left toe(s): Secondary | ICD-10-CM | POA: Diagnosis not present

## 2016-08-12 DIAGNOSIS — E1121 Type 2 diabetes mellitus with diabetic nephropathy: Secondary | ICD-10-CM | POA: Diagnosis not present

## 2016-08-12 DIAGNOSIS — R2689 Other abnormalities of gait and mobility: Secondary | ICD-10-CM | POA: Diagnosis not present

## 2016-08-12 DIAGNOSIS — L97521 Non-pressure chronic ulcer of other part of left foot limited to breakdown of skin: Secondary | ICD-10-CM | POA: Diagnosis not present

## 2016-08-12 DIAGNOSIS — R262 Difficulty in walking, not elsewhere classified: Secondary | ICD-10-CM | POA: Diagnosis not present

## 2016-08-12 DIAGNOSIS — M6281 Muscle weakness (generalized): Secondary | ICD-10-CM | POA: Diagnosis not present

## 2016-08-12 DIAGNOSIS — I70299 Other atherosclerosis of native arteries of extremities, unspecified extremity: Secondary | ICD-10-CM | POA: Diagnosis not present

## 2016-08-12 DIAGNOSIS — L97909 Non-pressure chronic ulcer of unspecified part of unspecified lower leg with unspecified severity: Secondary | ICD-10-CM | POA: Diagnosis not present

## 2016-08-12 DIAGNOSIS — M24561 Contracture, right knee: Secondary | ICD-10-CM | POA: Diagnosis not present

## 2016-08-12 DIAGNOSIS — Z79899 Other long term (current) drug therapy: Secondary | ICD-10-CM | POA: Diagnosis not present

## 2016-08-12 DIAGNOSIS — R279 Unspecified lack of coordination: Secondary | ICD-10-CM | POA: Diagnosis not present

## 2016-08-13 DIAGNOSIS — M79675 Pain in left toe(s): Secondary | ICD-10-CM | POA: Diagnosis not present

## 2016-08-13 DIAGNOSIS — R2689 Other abnormalities of gait and mobility: Secondary | ICD-10-CM | POA: Diagnosis not present

## 2016-08-13 DIAGNOSIS — M24561 Contracture, right knee: Secondary | ICD-10-CM | POA: Diagnosis not present

## 2016-08-13 DIAGNOSIS — R279 Unspecified lack of coordination: Secondary | ICD-10-CM | POA: Diagnosis not present

## 2016-08-13 DIAGNOSIS — M6281 Muscle weakness (generalized): Secondary | ICD-10-CM | POA: Diagnosis not present

## 2016-08-13 DIAGNOSIS — R262 Difficulty in walking, not elsewhere classified: Secondary | ICD-10-CM | POA: Diagnosis not present

## 2016-08-14 DIAGNOSIS — M6281 Muscle weakness (generalized): Secondary | ICD-10-CM | POA: Diagnosis not present

## 2016-08-14 DIAGNOSIS — R262 Difficulty in walking, not elsewhere classified: Secondary | ICD-10-CM | POA: Diagnosis not present

## 2016-08-14 DIAGNOSIS — M79675 Pain in left toe(s): Secondary | ICD-10-CM | POA: Diagnosis not present

## 2016-08-14 DIAGNOSIS — R279 Unspecified lack of coordination: Secondary | ICD-10-CM | POA: Diagnosis not present

## 2016-08-14 DIAGNOSIS — M24561 Contracture, right knee: Secondary | ICD-10-CM | POA: Diagnosis not present

## 2016-08-14 DIAGNOSIS — R2689 Other abnormalities of gait and mobility: Secondary | ICD-10-CM | POA: Diagnosis not present

## 2016-08-15 DIAGNOSIS — R262 Difficulty in walking, not elsewhere classified: Secondary | ICD-10-CM | POA: Diagnosis not present

## 2016-08-15 DIAGNOSIS — M24561 Contracture, right knee: Secondary | ICD-10-CM | POA: Diagnosis not present

## 2016-08-15 DIAGNOSIS — M6281 Muscle weakness (generalized): Secondary | ICD-10-CM | POA: Diagnosis not present

## 2016-08-15 DIAGNOSIS — R279 Unspecified lack of coordination: Secondary | ICD-10-CM | POA: Diagnosis not present

## 2016-08-18 ENCOUNTER — Encounter (HOSPITAL_COMMUNITY): Payer: Self-pay | Admitting: *Deleted

## 2016-08-18 DIAGNOSIS — R279 Unspecified lack of coordination: Secondary | ICD-10-CM | POA: Diagnosis not present

## 2016-08-18 DIAGNOSIS — R262 Difficulty in walking, not elsewhere classified: Secondary | ICD-10-CM | POA: Diagnosis not present

## 2016-08-18 DIAGNOSIS — M6281 Muscle weakness (generalized): Secondary | ICD-10-CM | POA: Diagnosis not present

## 2016-08-18 DIAGNOSIS — M24561 Contracture, right knee: Secondary | ICD-10-CM | POA: Diagnosis not present

## 2016-08-18 NOTE — Pre-Procedure Instructions (Signed)
    Terry Green [facility]  08/18/2016      [id number] - [place], [place] - [address] AT [facility] [place] [address] Phone: [phone] Fax: [phone]    Your procedure is scheduled on Tuesday, August 19, 2016  Report to [facility] Admitting at 5:30 A.M.  Call this number if you have problems the morning of surgery:  [phone]   Remember:  Do not eat food or drink liquids after midnight.  Take these medicines the morning of surgery with A SIP OF WATER : amLODipine (NORVASC), aspirin, carvedilol (COREG), divalproex (DEPAKOTE), gabapentin (NEURONTIN), loratadine (CLARITIN), sertraline (ZOLOFT), if needed: pain medication Stop taking Plavix (already stopped), vitamins, fish oil and herbal medications. Do not take any NSAIDs ie: Ibuprofen, Advil, Naproxen BC and Goody Powder; stop now.    How to Manage Your Diabetes Before and After Surgery  How do I manage my blood sugar before surgery? . Check your blood sugar at least 4 times a day, starting 2 days before surgery, to make sure that the level is not too high or low. o Check your blood sugar the morning of your surgery when you wake up and every 2 hours until you get to the Short Stay unit. . If your blood sugar is less than 70 mg/dL, you will need to treat for low blood sugar: o Do not take insulin. o Treat a low blood sugar (less than 70 mg/dL) with  cup of clear juice (cranberry or apple), 4 glucose tablets, OR glucose gel. o Recheck blood sugar in 15 minutes after treatment (to make sure it is greater than 70 mg/dL). If your blood sugar is not greater than 70 mg/dL on recheck, call [phone] for further instructions. . Report your blood sugar to the short stay nurse when you get to Short Stay.  . If you are admitted to the hospital after surgery: o Your blood sugar will be checked by the staff and you will probably be given insulin after surgery (instead of oral  diabetes medicines) to make sure you have good blood sugar levels. o The goal for blood sugar control after surgery is 80-180 mg/dL.  WHAT DO I DO ABOUT MY DIABETES MEDICATION?   Marland Kitchen Do not take oral diabetes medicines (pills) the morning of surgery such as metFORMIN (GLUCOPHAGE)   THE NIGHT BEFORE SURGERY, take 7 units of Lantus Insulin as instructed by MD.  . If your CBG is greater than 220 mg/dL, you may take  of your sliding scale (correction) dose of insulin. .  Reviewed and Endorsed by [facility] Patient Education Committee, August 2015  Do not wear jewelry, make-up or nail polish.  Do not wear lotions, powders, or perfumes, or deoderant.  Do not shave 48 hours prior to surgery.  Men may shave face and neck.  Do not bring valuables to the hospital.  [facility] is not responsible for any belongings or valuables.  Contacts, dentures or bridgework may not be worn into surgery.  Leave your suitcase in the car.  After surgery it may be brought to your room.  For patients admitted to the hospital, discharge time will be determined by your treatment team.

## 2016-08-18 NOTE — Progress Notes (Signed)
PT SDW-pre-op call completed by pt nurse, Larita Fife, LPN of [facility]. Nurse denies that pt C/O SOB and chest pain. Nurse denies that pt is under the care of a cardiologist. Nurse stated that pt last dose of Plavix was 08/13/16 as instructed by MD. Nurse denies record of stress test, echo and cardiac cath ( confirm with pt DOS) . Nurse reviewed and was faxed pre-op instruction sheet. Nurse confirmed receipt of fax. Nurse verbalized understanding of all pre-op instructions.

## 2016-08-19 ENCOUNTER — Inpatient Hospital Stay (HOSPITAL_COMMUNITY): Payer: Medicare Other | Admitting: Certified Registered Nurse Anesthetist

## 2016-08-19 ENCOUNTER — Encounter (HOSPITAL_COMMUNITY): Payer: Self-pay | Admitting: Surgery

## 2016-08-19 ENCOUNTER — Inpatient Hospital Stay (HOSPITAL_COMMUNITY): Payer: Medicare Other

## 2016-08-19 ENCOUNTER — Inpatient Hospital Stay (HOSPITAL_COMMUNITY)
Admission: RE | Admit: 2016-08-19 | Discharge: 2016-08-21 | DRG: 253 | Disposition: A | Discharge status: Skilled Nursing Facility | Payer: Medicare Other | Source: Ambulatory Visit | Attending: Vascular Surgery | Admitting: Vascular Surgery

## 2016-08-19 ENCOUNTER — Encounter (HOSPITAL_COMMUNITY)
Admission: RE | Disposition: A | Discharge status: Skilled Nursing Facility | Payer: Self-pay | Source: Ambulatory Visit | Attending: Vascular Surgery

## 2016-08-19 DIAGNOSIS — Z7902 Long term (current) use of antithrombotics/antiplatelets: Secondary | ICD-10-CM | POA: Diagnosis not present

## 2016-08-19 DIAGNOSIS — N400 Enlarged prostate without lower urinary tract symptoms: Secondary | ICD-10-CM | POA: Diagnosis present

## 2016-08-19 DIAGNOSIS — I10 Essential (primary) hypertension: Secondary | ICD-10-CM | POA: Diagnosis present

## 2016-08-19 DIAGNOSIS — Z87891 Personal history of nicotine dependence: Secondary | ICD-10-CM | POA: Diagnosis not present

## 2016-08-19 DIAGNOSIS — Z7982 Long term (current) use of aspirin: Secondary | ICD-10-CM | POA: Diagnosis not present

## 2016-08-19 DIAGNOSIS — D62 Acute posthemorrhagic anemia: Secondary | ICD-10-CM | POA: Diagnosis not present

## 2016-08-19 DIAGNOSIS — I998 Other disorder of circulatory system: Secondary | ICD-10-CM | POA: Diagnosis not present

## 2016-08-19 DIAGNOSIS — I70235 Atherosclerosis of native arteries of right leg with ulceration of other part of foot: Secondary | ICD-10-CM

## 2016-08-19 DIAGNOSIS — E1151 Type 2 diabetes mellitus with diabetic peripheral angiopathy without gangrene: Secondary | ICD-10-CM | POA: Diagnosis present

## 2016-08-19 DIAGNOSIS — I2510 Atherosclerotic heart disease of native coronary artery without angina pectoris: Secondary | ICD-10-CM | POA: Diagnosis present

## 2016-08-19 DIAGNOSIS — I70245 Atherosclerosis of native arteries of left leg with ulceration of other part of foot: Secondary | ICD-10-CM | POA: Diagnosis not present

## 2016-08-19 DIAGNOSIS — Z419 Encounter for procedure for purposes other than remedying health state, unspecified: Secondary | ICD-10-CM

## 2016-08-19 DIAGNOSIS — Z794 Long term (current) use of insulin: Secondary | ICD-10-CM | POA: Diagnosis not present

## 2016-08-19 DIAGNOSIS — S81802A Unspecified open wound, left lower leg, initial encounter: Secondary | ICD-10-CM | POA: Diagnosis not present

## 2016-08-19 DIAGNOSIS — I959 Hypotension, unspecified: Secondary | ICD-10-CM | POA: Diagnosis not present

## 2016-08-19 DIAGNOSIS — Z79899 Other long term (current) drug therapy: Secondary | ICD-10-CM

## 2016-08-19 DIAGNOSIS — Z89511 Acquired absence of right leg below knee: Secondary | ICD-10-CM | POA: Diagnosis not present

## 2016-08-19 DIAGNOSIS — M069 Rheumatoid arthritis, unspecified: Secondary | ICD-10-CM | POA: Diagnosis present

## 2016-08-19 DIAGNOSIS — F319 Bipolar disorder, unspecified: Secondary | ICD-10-CM | POA: Diagnosis present

## 2016-08-19 DIAGNOSIS — E43 Unspecified severe protein-calorie malnutrition: Secondary | ICD-10-CM | POA: Diagnosis not present

## 2016-08-19 DIAGNOSIS — Z8249 Family history of ischemic heart disease and other diseases of the circulatory system: Secondary | ICD-10-CM | POA: Diagnosis not present

## 2016-08-19 DIAGNOSIS — I999 Unspecified disorder of circulatory system: Secondary | ICD-10-CM | POA: Diagnosis not present

## 2016-08-19 DIAGNOSIS — I743 Embolism and thrombosis of arteries of the lower extremities: Secondary | ICD-10-CM | POA: Diagnosis not present

## 2016-08-19 DIAGNOSIS — E1152 Type 2 diabetes mellitus with diabetic peripheral angiopathy with gangrene: Secondary | ICD-10-CM | POA: Diagnosis not present

## 2016-08-19 DIAGNOSIS — S98119A Complete traumatic amputation of unspecified great toe, initial encounter: Secondary | ICD-10-CM | POA: Diagnosis not present

## 2016-08-19 HISTORY — DX: Depression, unspecified: F32.A

## 2016-08-19 HISTORY — DX: Atherosclerosis of native arteries of extremities with gangrene, unspecified extremity: I70.269

## 2016-08-19 HISTORY — DX: Weakness: R53.1

## 2016-08-19 HISTORY — DX: Unspecified dementia, unspecified severity, without behavioral disturbance, psychotic disturbance, mood disturbance, and anxiety: F03.90

## 2016-08-19 HISTORY — PX: BYPASS GRAFT POPLITEAL TO TIBIAL: SHX5764

## 2016-08-19 HISTORY — DX: Unspecified abnormalities of gait and mobility: R26.9

## 2016-08-19 HISTORY — DX: Unspecified psychosis not due to a substance or known physiological condition: F29

## 2016-08-19 HISTORY — DX: Anemia, unspecified: D64.9

## 2016-08-19 HISTORY — DX: Atherosclerotic heart disease of native coronary artery without angina pectoris: I25.10

## 2016-08-19 HISTORY — DX: Anxiety disorder, unspecified: F41.9

## 2016-08-19 HISTORY — DX: Benign prostatic hyperplasia without lower urinary tract symptoms: N40.0

## 2016-08-19 HISTORY — PX: AMPUTATION: SHX166

## 2016-08-19 HISTORY — DX: Major depressive disorder, single episode, unspecified: F32.9

## 2016-08-19 LAB — APTT: aPTT: 30 seconds (ref 24–36)

## 2016-08-19 LAB — PROTIME-INR
INR: 1.08
PROTHROMBIN TIME: 14 s (ref 11.4–15.2)

## 2016-08-19 LAB — CBC
HCT: 27.8 % — ABNORMAL LOW (ref 39.0–52.0)
HEMATOCRIT: 24.1 % — AB (ref 39.0–52.0)
HEMOGLOBIN: 7.4 g/dL — AB (ref 13.0–17.0)
Hemoglobin: 8.4 g/dL — ABNORMAL LOW (ref 13.0–17.0)
MCH: 21.3 pg — ABNORMAL LOW (ref 26.0–34.0)
MCH: 21.6 pg — ABNORMAL LOW (ref 26.0–34.0)
MCHC: 30.2 g/dL (ref 30.0–36.0)
MCHC: 30.7 g/dL (ref 30.0–36.0)
MCV: 70.4 fL — AB (ref 78.0–100.0)
MCV: 70.3 fL — ABNORMAL LOW (ref 78.0–100.0)
PLATELETS: 240 10*3/uL (ref 150–400)
Platelets: 218 10*3/uL (ref 150–400)
RBC: 3.95 MIL/uL — ABNORMAL LOW (ref 4.22–5.81)
RBC: 3.43 MIL/uL — AB (ref 4.22–5.81)
RDW: 22.3 % — AB (ref 11.5–15.5)
RDW: 22.3 % — AB (ref 11.5–15.5)
WBC: 7.5 10*3/uL (ref 4.0–10.5)
WBC: 7.9 10*3/uL (ref 4.0–10.5)

## 2016-08-19 LAB — COMPREHENSIVE METABOLIC PANEL
ALT: 11 U/L — ABNORMAL LOW (ref 17–63)
AST: 21 U/L (ref 15–41)
Albumin: 3 g/dL — ABNORMAL LOW (ref 3.5–5.0)
Alkaline Phosphatase: 55 U/L (ref 38–126)
Anion gap: 13 (ref 5–15)
BUN: 15 mg/dL (ref 6–20)
CHLORIDE: 103 mmol/L (ref 101–111)
CO2: 22 mmol/L (ref 22–32)
Calcium: 9 mg/dL (ref 8.9–10.3)
Creatinine, Ser: 0.93 mg/dL (ref 0.61–1.24)
Glucose, Bld: 173 mg/dL — ABNORMAL HIGH (ref 65–99)
POTASSIUM: 4.1 mmol/L (ref 3.5–5.1)
SODIUM: 138 mmol/L (ref 135–145)
Total Bilirubin: 0.6 mg/dL (ref 0.3–1.2)
Total Protein: 7.2 g/dL (ref 6.5–8.1)

## 2016-08-19 LAB — GLUCOSE, CAPILLARY
GLUCOSE-CAPILLARY: 113 mg/dL — AB (ref 65–99)
Glucose-Capillary: 134 mg/dL — ABNORMAL HIGH (ref 65–99)
Glucose-Capillary: 155 mg/dL — ABNORMAL HIGH (ref 65–99)

## 2016-08-19 LAB — CREATININE, SERUM: Creatinine, Ser: 0.95 mg/dL (ref 0.61–1.24)

## 2016-08-19 LAB — SURGICAL PCR SCREEN
MRSA, PCR: NEGATIVE
Staphylococcus aureus: NEGATIVE

## 2016-08-19 SURGERY — CREATION, BYPASS, ARTERIAL, POPLITEAL TO TIBIAL, USING GRAFT
Anesthesia: General | Site: Leg Lower | Laterality: Left

## 2016-08-19 MED ORDER — INSULIN ASPART 100 UNIT/ML FLEXPEN: 0.0000 [IU] | PEN_INJECTOR | Freq: Two times a day (BID) | SUBCUTANEOUS | Status: DC

## 2016-08-19 MED ORDER — PAPAVERINE HCL 30 MG/ML IJ SOLN
INTRAMUSCULAR | Status: AC
  Filled 2016-08-19: qty 2

## 2016-08-19 MED ORDER — HEPARIN SODIUM (PORCINE) 1000 UNIT/ML IJ SOLN
INTRAMUSCULAR | Status: DC | PRN
  Administered 2016-08-19: 7000 [IU] via INTRAVENOUS

## 2016-08-19 MED ORDER — BACITRACIN ZINC 500 UNIT/GM EX OINT
TOPICAL_OINTMENT | CUTANEOUS | Status: AC
  Filled 2016-08-19: qty 28.35

## 2016-08-19 MED ORDER — SODIUM CHLORIDE 0.9 % IV SOLN: INTRAVENOUS | Status: DC

## 2016-08-19 MED ORDER — PHENYLEPHRINE HCL 10 MG/ML IJ SOLN
INTRAMUSCULAR | Status: DC | PRN
  Administered 2016-08-19: 30 ug/min via INTRAVENOUS

## 2016-08-19 MED ORDER — AMLODIPINE BESYLATE 5 MG PO TABS
5.0000 mg | ORAL_TABLET | Freq: Every day | ORAL | Status: DC
  Administered 2016-08-20 – 2016-08-21 (×2): 5 mg via ORAL
  Filled 2016-08-19 (×2): qty 1

## 2016-08-19 MED ORDER — SUGAMMADEX SODIUM 200 MG/2ML IV SOLN
INTRAVENOUS | Status: DC | PRN
  Administered 2016-08-19: 200 mg via INTRAVENOUS

## 2016-08-19 MED ORDER — INSULIN ASPART 100 UNIT/ML ~~LOC~~ SOLN
0.0000 [IU] | Freq: Three times a day (TID) | SUBCUTANEOUS | Status: DC
  Administered 2016-08-19: 2 [IU] via SUBCUTANEOUS
  Administered 2016-08-20: 1 [IU] via SUBCUTANEOUS
  Administered 2016-08-20: 2 [IU] via SUBCUTANEOUS
  Administered 2016-08-21: 1 [IU] via SUBCUTANEOUS

## 2016-08-19 MED ORDER — LORATADINE 10 MG PO TABS
10.0000 mg | ORAL_TABLET | Freq: Every day | ORAL | Status: DC
  Administered 2016-08-20 – 2016-08-21 (×2): 10 mg via ORAL
  Filled 2016-08-19 (×2): qty 1

## 2016-08-19 MED ORDER — FENTANYL CITRATE (PF) 100 MCG/2ML IJ SOLN: 25.0000 ug | INTRAMUSCULAR | Status: DC | PRN

## 2016-08-19 MED ORDER — SODIUM CHLORIDE 0.9 % IV SOLN
INTRAVENOUS | Status: DC
  Administered 2016-08-19: 15:00:00 via INTRAVENOUS

## 2016-08-19 MED ORDER — HYDRALAZINE HCL 20 MG/ML IJ SOLN: 5.0000 mg | INTRAMUSCULAR | Status: DC | PRN

## 2016-08-19 MED ORDER — HYDROCODONE-ACETAMINOPHEN 7.5-325 MG PO TABS
1.0000 | ORAL_TABLET | ORAL | Status: DC | PRN
  Administered 2016-08-19: 2 via ORAL
  Filled 2016-08-19: qty 2

## 2016-08-19 MED ORDER — METOPROLOL TARTRATE 5 MG/5ML IV SOLN: 2.0000 mg | INTRAVENOUS | Status: DC | PRN

## 2016-08-19 MED ORDER — GUAIFENESIN-DM 100-10 MG/5ML PO SYRP: 10.0000 mL | ORAL_SOLUTION | ORAL | Status: DC | PRN

## 2016-08-19 MED ORDER — 0.9 % SODIUM CHLORIDE (POUR BTL) OPTIME
TOPICAL | Status: DC | PRN
Start: 2016-08-19 — End: 2016-08-19
  Administered 2016-08-19: 2000 mL

## 2016-08-19 MED ORDER — OXYCODONE HCL 5 MG PO TABS: 5.0000 mg | ORAL_TABLET | Freq: Once | ORAL | Status: DC | PRN

## 2016-08-19 MED ORDER — ALUM & MAG HYDROXIDE-SIMETH 200-200-20 MG/5ML PO SUSP: 15.0000 mL | Freq: Four times a day (QID) | ORAL | Status: DC | PRN

## 2016-08-19 MED ORDER — GABAPENTIN 300 MG PO CAPS
300.0000 mg | ORAL_CAPSULE | Freq: Three times a day (TID) | ORAL | Status: DC
  Administered 2016-08-19 – 2016-08-21 (×5): 300 mg via ORAL
  Filled 2016-08-19 (×5): qty 1

## 2016-08-19 MED ORDER — MULTI-VITAMIN/MINERALS PO TABS: 1.0000 | ORAL_TABLET | Freq: Every day | ORAL | Status: DC

## 2016-08-19 MED ORDER — CHLORHEXIDINE GLUCONATE CLOTH 2 % EX PADS: 6.0000 | MEDICATED_PAD | Freq: Once | CUTANEOUS | Status: DC

## 2016-08-19 MED ORDER — ONDANSETRON HCL 4 MG/2ML IJ SOLN
INTRAMUSCULAR | Status: DC | PRN
  Administered 2016-08-19: 4 mg via INTRAVENOUS

## 2016-08-19 MED ORDER — PANTOPRAZOLE SODIUM 40 MG PO TBEC
40.0000 mg | DELAYED_RELEASE_TABLET | Freq: Every day | ORAL | Status: DC
  Administered 2016-08-20 – 2016-08-21 (×2): 40 mg via ORAL
  Filled 2016-08-19 (×2): qty 1

## 2016-08-19 MED ORDER — OXYCODONE HCL 5 MG/5ML PO SOLN: 5.0000 mg | Freq: Once | ORAL | Status: DC | PRN

## 2016-08-19 MED ORDER — ORAL CARE MOUTH RINSE
15.0000 mL | Freq: Two times a day (BID) | OROMUCOSAL | Status: DC
  Administered 2016-08-19: 15 mL via OROMUCOSAL

## 2016-08-19 MED ORDER — DOCUSATE SODIUM 100 MG PO CAPS
100.0000 mg | ORAL_CAPSULE | Freq: Every day | ORAL | Status: DC
  Administered 2016-08-20 – 2016-08-21 (×2): 100 mg via ORAL
  Filled 2016-08-19 (×2): qty 1

## 2016-08-19 MED ORDER — RISPERIDONE 0.5 MG PO TABS
0.5000 mg | ORAL_TABLET | Freq: Every day | ORAL | Status: DC
  Administered 2016-08-19 – 2016-08-20 (×2): 0.5 mg via ORAL
  Filled 2016-08-19 (×2): qty 1

## 2016-08-19 MED ORDER — VITAMIN C 500 MG PO TABS
500.0000 mg | ORAL_TABLET | Freq: Two times a day (BID) | ORAL | Status: DC
  Administered 2016-08-19 – 2016-08-21 (×4): 500 mg via ORAL
  Filled 2016-08-19 (×4): qty 1

## 2016-08-19 MED ORDER — IOPAMIDOL (ISOVUE-300) INJECTION 61%
INTRAVENOUS | Status: AC
  Filled 2016-08-19: qty 50

## 2016-08-19 MED ORDER — POTASSIUM CHLORIDE CRYS ER 20 MEQ PO TBCR: 20.0000 meq | EXTENDED_RELEASE_TABLET | Freq: Every day | ORAL | Status: DC | PRN

## 2016-08-19 MED ORDER — CARVEDILOL 12.5 MG PO TABS
12.5000 mg | ORAL_TABLET | Freq: Two times a day (BID) | ORAL | Status: DC
  Administered 2016-08-20 – 2016-08-21 (×3): 12.5 mg via ORAL
  Filled 2016-08-19 (×3): qty 1

## 2016-08-19 MED ORDER — MEMANTINE HCL 10 MG PO TABS
10.0000 mg | ORAL_TABLET | Freq: Two times a day (BID) | ORAL | Status: DC
  Administered 2016-08-19 – 2016-08-21 (×4): 10 mg via ORAL
  Filled 2016-08-19 (×6): qty 1

## 2016-08-19 MED ORDER — INSULIN GLARGINE 100 UNIT/ML ~~LOC~~ SOLN
15.0000 [IU] | Freq: Every day | SUBCUTANEOUS | Status: DC
  Administered 2016-08-19 – 2016-08-20 (×2): 15 [IU] via SUBCUTANEOUS
  Filled 2016-08-19 (×2): qty 0.15

## 2016-08-19 MED ORDER — ALBUMIN HUMAN 5 % IV SOLN
INTRAVENOUS | Status: DC | PRN
  Administered 2016-08-19: 10:00:00 via INTRAVENOUS

## 2016-08-19 MED ORDER — MAGNESIUM SULFATE 2 GM/50ML IV SOLN
2.0000 g | Freq: Every day | INTRAVENOUS | Status: DC | PRN
  Filled 2016-08-19: qty 50

## 2016-08-19 MED ORDER — PROPOFOL 10 MG/ML IV BOLUS
INTRAVENOUS | Status: DC | PRN
  Administered 2016-08-19: 130 mg via INTRAVENOUS

## 2016-08-19 MED ORDER — GLYCOPYRROLATE 0.2 MG/ML IJ SOLN
INTRAMUSCULAR | Status: DC | PRN
Start: 2016-08-19 — End: 2016-08-19
  Administered 2016-08-19 (×5): 0.2 mg via INTRAVENOUS

## 2016-08-19 MED ORDER — PROTAMINE SULFATE 10 MG/ML IV SOLN
INTRAVENOUS | Status: DC | PRN
  Administered 2016-08-19: 40 mg via INTRAVENOUS

## 2016-08-19 MED ORDER — TAMSULOSIN HCL 0.4 MG PO CAPS
0.4000 mg | ORAL_CAPSULE | Freq: Every day | ORAL | Status: DC
  Administered 2016-08-19 – 2016-08-20 (×2): 0.4 mg via ORAL
  Filled 2016-08-19 (×2): qty 1

## 2016-08-19 MED ORDER — MUPIROCIN 2 % EX OINT
TOPICAL_OINTMENT | CUTANEOUS | Status: AC
  Administered 2016-08-19: 1 via TOPICAL
  Filled 2016-08-19: qty 22

## 2016-08-19 MED ORDER — ACETAMINOPHEN 500 MG PO TABS
1000.0000 mg | ORAL_TABLET | Freq: Three times a day (TID) | ORAL | Status: DC | PRN
  Administered 2016-08-20: 1000 mg via ORAL
  Filled 2016-08-19: qty 2

## 2016-08-19 MED ORDER — PROPOFOL 10 MG/ML IV BOLUS
INTRAVENOUS | Status: AC
  Filled 2016-08-19: qty 20

## 2016-08-19 MED ORDER — CLOPIDOGREL BISULFATE 75 MG PO TABS
75.0000 mg | ORAL_TABLET | Freq: Every day | ORAL | Status: DC
  Administered 2016-08-20 – 2016-08-21 (×2): 75 mg via ORAL
  Filled 2016-08-19 (×2): qty 1

## 2016-08-19 MED ORDER — BUSPIRONE HCL 15 MG PO TABS
15.0000 mg | ORAL_TABLET | Freq: Every evening | ORAL | Status: DC
  Administered 2016-08-20: 15 mg via ORAL
  Filled 2016-08-19: qty 1

## 2016-08-19 MED ORDER — IOPAMIDOL (ISOVUE-300) INJECTION 61%
INTRAVENOUS | Status: DC | PRN
  Administered 2016-08-19: 15 mL via INTRA_ARTERIAL

## 2016-08-19 MED ORDER — ONDANSETRON HCL 4 MG/2ML IJ SOLN: 4.0000 mg | Freq: Four times a day (QID) | INTRAMUSCULAR | Status: DC | PRN

## 2016-08-19 MED ORDER — MORPHINE SULFATE (PF) 2 MG/ML IV SOLN
2.0000 mg | INTRAVENOUS | Status: DC | PRN
  Administered 2016-08-19: 4 mg via INTRAVENOUS
  Administered 2016-08-19: 2 mg via INTRAVENOUS
  Filled 2016-08-19: qty 1
  Filled 2016-08-19: qty 2

## 2016-08-19 MED ORDER — SODIUM CHLORIDE 0.9 % IV SOLN: 500.0000 mL | Freq: Once | INTRAVENOUS | Status: DC | PRN

## 2016-08-19 MED ORDER — FENTANYL CITRATE (PF) 100 MCG/2ML IJ SOLN
INTRAMUSCULAR | Status: DC | PRN
  Administered 2016-08-19 (×2): 25 ug via INTRAVENOUS
  Administered 2016-08-19: 100 ug via INTRAVENOUS

## 2016-08-19 MED ORDER — BACITRACIN ZINC 500 UNIT/GM EX OINT
TOPICAL_OINTMENT | CUTANEOUS | Status: DC | PRN
  Administered 2016-08-19: 1 via TOPICAL

## 2016-08-19 MED ORDER — DIVALPROEX SODIUM ER 500 MG PO TB24
500.0000 mg | ORAL_TABLET | Freq: Two times a day (BID) | ORAL | Status: DC
  Administered 2016-08-19 – 2016-08-21 (×4): 500 mg via ORAL
  Filled 2016-08-19 (×4): qty 1

## 2016-08-19 MED ORDER — SODIUM CHLORIDE 0.9 % IV SOLN
INTRAVENOUS | Status: DC | PRN
  Administered 2016-08-19: 500 mL

## 2016-08-19 MED ORDER — SERTRALINE HCL 100 MG PO TABS
100.0000 mg | ORAL_TABLET | Freq: Every day | ORAL | Status: DC
  Administered 2016-08-20 – 2016-08-21 (×2): 100 mg via ORAL
  Filled 2016-08-19 (×2): qty 1

## 2016-08-19 MED ORDER — LABETALOL HCL 5 MG/ML IV SOLN: 10.0000 mg | INTRAVENOUS | Status: DC | PRN

## 2016-08-19 MED ORDER — ASPIRIN EC 81 MG PO TBEC
81.0000 mg | DELAYED_RELEASE_TABLET | Freq: Every day | ORAL | Status: DC
  Administered 2016-08-20 – 2016-08-21 (×2): 81 mg via ORAL
  Filled 2016-08-19 (×2): qty 1

## 2016-08-19 MED ORDER — MUPIROCIN 2 % EX OINT
1.0000 "application " | TOPICAL_OINTMENT | Freq: Once | CUTANEOUS | Status: AC
  Administered 2016-08-19: 1 via TOPICAL

## 2016-08-19 MED ORDER — FENTANYL CITRATE (PF) 250 MCG/5ML IJ SOLN
INTRAMUSCULAR | Status: AC
  Filled 2016-08-19: qty 5

## 2016-08-19 MED ORDER — PAPAVERINE HCL 30 MG/ML IJ SOLN
INTRAMUSCULAR | Status: DC | PRN
  Administered 2016-08-19: 60 mg via INTRAVENOUS

## 2016-08-19 MED ORDER — PHENOL 1.4 % MT LIQD: 1.0000 | OROMUCOSAL | Status: DC | PRN

## 2016-08-19 MED ORDER — INSULIN GLARGINE 100 UNIT/ML SOLOSTAR PEN: 15.0000 [IU] | PEN_INJECTOR | Freq: Every day | SUBCUTANEOUS | Status: DC

## 2016-08-19 MED ORDER — METFORMIN HCL 500 MG PO TABS
1000.0000 mg | ORAL_TABLET | Freq: Two times a day (BID) | ORAL | Status: DC
  Administered 2016-08-21: 1000 mg via ORAL
  Filled 2016-08-19: qty 2

## 2016-08-19 MED ORDER — EPHEDRINE SULFATE 50 MG/ML IJ SOLN
INTRAMUSCULAR | Status: DC | PRN
  Administered 2016-08-19 (×2): 5 mg via INTRAVENOUS
  Administered 2016-08-19: 10 mg via INTRAVENOUS
  Administered 2016-08-19: 5 mg via INTRAVENOUS

## 2016-08-19 MED ORDER — DEXTROSE 5 % IV SOLN
1.5000 g | INTRAVENOUS | Status: AC
  Administered 2016-08-19 (×2): 1.5 g via INTRAVENOUS
  Filled 2016-08-19: qty 1.5

## 2016-08-19 MED ORDER — OXYBUTYNIN CHLORIDE ER 10 MG PO TB24
10.0000 mg | ORAL_TABLET | Freq: Every day | ORAL | Status: DC
  Administered 2016-08-19 – 2016-08-20 (×2): 10 mg via ORAL
  Filled 2016-08-19 (×2): qty 1

## 2016-08-19 MED ORDER — ENOXAPARIN SODIUM 40 MG/0.4ML ~~LOC~~ SOLN
40.0000 mg | SUBCUTANEOUS | Status: DC
  Administered 2016-08-21: 40 mg via SUBCUTANEOUS
  Filled 2016-08-19: qty 0.4

## 2016-08-19 MED ORDER — LACTATED RINGERS IV SOLN
INTRAVENOUS | Status: DC | PRN
  Administered 2016-08-19 (×2): via INTRAVENOUS

## 2016-08-19 MED ORDER — ADULT MULTIVITAMIN W/MINERALS CH
1.0000 | ORAL_TABLET | Freq: Every day | ORAL | Status: DC
  Administered 2016-08-20 – 2016-08-21 (×2): 1 via ORAL
  Filled 2016-08-19 (×2): qty 1

## 2016-08-19 MED ORDER — DEXTROSE 5 % IV SOLN
1.5000 g | Freq: Two times a day (BID) | INTRAVENOUS | Status: AC
  Administered 2016-08-19 – 2016-08-20 (×2): 1.5 g via INTRAVENOUS
  Filled 2016-08-19 (×2): qty 1.5

## 2016-08-19 MED ORDER — DEXTROSE 5 % IV SOLN
1.5000 g | INTRAVENOUS | Status: DC
  Filled 2016-08-19: qty 1.5

## 2016-08-19 MED ORDER — ROCURONIUM BROMIDE 100 MG/10ML IV SOLN
INTRAVENOUS | Status: DC | PRN
  Administered 2016-08-19: 10 mg via INTRAVENOUS
  Administered 2016-08-19: 20 mg via INTRAVENOUS
  Administered 2016-08-19: 60 mg via INTRAVENOUS
  Administered 2016-08-19: 20 mg via INTRAVENOUS
  Administered 2016-08-19 (×3): 10 mg via INTRAVENOUS

## 2016-08-19 SURGICAL SUPPLY — 66 items
BANDAGE ACE 4X5 VEL STRL LF (GAUZE/BANDAGES/DRESSINGS) ×8 IMPLANT
BANDAGE ESMARK 6X9 LF (GAUZE/BANDAGES/DRESSINGS) ×2 IMPLANT
BLADE AVERAGE 25MMX9MM (BLADE) ×1
BLADE AVERAGE 25X9 (BLADE) ×3 IMPLANT
BNDG ESMARK 6X9 LF (GAUZE/BANDAGES/DRESSINGS) ×4
BNDG GAUZE ELAST 4 BULKY (GAUZE/BANDAGES/DRESSINGS) ×4 IMPLANT
CANISTER SUCT 3000ML PPV (MISCELLANEOUS) ×4 IMPLANT
CANNULA VESSEL 3MM 2 BLNT TIP (CANNULA) ×4 IMPLANT
CANNULA VESSEL W/WING W/VALVE (CANNULA) ×8 IMPLANT
CLIP TI MEDIUM 24 (CLIP) ×4 IMPLANT
CLIP TI WIDE RED SMALL 24 (CLIP) ×8 IMPLANT
COVER SURGICAL LIGHT HANDLE (MISCELLANEOUS) IMPLANT
CUFF TOURNIQUET SINGLE 24IN (TOURNIQUET CUFF) ×4 IMPLANT
CUFF TOURNIQUET SINGLE 34IN LL (TOURNIQUET CUFF) IMPLANT
CUFF TOURNIQUET SINGLE 44IN (TOURNIQUET CUFF) IMPLANT
DERMABOND ADHESIVE PROPEN (GAUZE/BANDAGES/DRESSINGS) ×2
DERMABOND ADVANCED .7 DNX6 (GAUZE/BANDAGES/DRESSINGS) ×2 IMPLANT
DRAIN CHANNEL 15F RND FF W/TCR (WOUND CARE) IMPLANT
DRAIN PENROSE 3/4X12 (DRAIN) IMPLANT
DRAPE EXTREMITY T 121X128X90 (DRAPE) IMPLANT
DRAPE HALF SHEET 40X57 (DRAPES) IMPLANT
DRAPE X-RAY CASS 24X20 (DRAPES) ×4 IMPLANT
ELECT REM PT RETURN 9FT ADLT (ELECTROSURGICAL) ×4
ELECTRODE REM PT RTRN 9FT ADLT (ELECTROSURGICAL) ×2 IMPLANT
EVACUATOR SILICONE 100CC (DRAIN) IMPLANT
GAUZE SPONGE 4X4 12PLY STRL (GAUZE/BANDAGES/DRESSINGS) ×4 IMPLANT
GAUZE SPONGE 4X4 12PLY STRL LF (GAUZE/BANDAGES/DRESSINGS) ×8 IMPLANT
GAUZE SPONGE 4X4 16PLY XRAY LF (GAUZE/BANDAGES/DRESSINGS) ×4 IMPLANT
GLOVE BIO SURGEON STRL SZ7.5 (GLOVE) ×4 IMPLANT
GLOVE BIOGEL PI IND STRL 8 (GLOVE) ×2 IMPLANT
GLOVE BIOGEL PI INDICATOR 8 (GLOVE) ×2
GOWN STRL REUS W/ TWL LRG LVL3 (GOWN DISPOSABLE) ×8 IMPLANT
GOWN STRL REUS W/TWL LRG LVL3 (GOWN DISPOSABLE) ×8
KIT BASIN OR (CUSTOM PROCEDURE TRAY) ×4 IMPLANT
KIT ROOM TURNOVER OR (KITS) ×4 IMPLANT
MARKER GRAFT CORONARY BYPASS (MISCELLANEOUS) ×4 IMPLANT
NEEDLE 18GX1X1/2 (RX/OR ONLY) (NEEDLE) ×4 IMPLANT
NS IRRIG 1000ML POUR BTL (IV SOLUTION) ×8 IMPLANT
PACK GENERAL/GYN (CUSTOM PROCEDURE TRAY) ×4 IMPLANT
PACK PERIPHERAL VASCULAR (CUSTOM PROCEDURE TRAY) ×4 IMPLANT
PAD ARMBOARD 7.5X6 YLW CONV (MISCELLANEOUS) ×8 IMPLANT
SET COLLECT BLD 21X3/4 12 (NEEDLE) IMPLANT
SET COLLECT BLD 21X3/4 12 PB (MISCELLANEOUS) ×4 IMPLANT
SPONGE SURGIFOAM ABS GEL 100 (HEMOSTASIS) IMPLANT
STAPLER VISISTAT (STAPLE) IMPLANT
STOPCOCK 4 WAY LG BORE MALE ST (IV SETS) ×4 IMPLANT
SUT ETHILON 3 0 PS 1 (SUTURE) ×12 IMPLANT
SUT PROLENE 5 0 C 1 24 (SUTURE) ×4 IMPLANT
SUT PROLENE 6 0 BV (SUTURE) ×16 IMPLANT
SUT PROLENE 7 0 BV 1 (SUTURE) ×12 IMPLANT
SUT SILK 2 0 FS (SUTURE) IMPLANT
SUT SILK 2 0 SH (SUTURE) ×4 IMPLANT
SUT SILK 3 0 (SUTURE) ×4
SUT SILK 3-0 18XBRD TIE 12 (SUTURE) ×4 IMPLANT
SUT VIC AB 2-0 CTB1 (SUTURE) ×8 IMPLANT
SUT VIC AB 3-0 SH 27 (SUTURE) ×4
SUT VIC AB 3-0 SH 27X BRD (SUTURE) ×4 IMPLANT
SUT VICRYL 4-0 PS2 18IN ABS (SUTURE) ×8 IMPLANT
SWAB COLLECTION DEVICE MRSA (MISCELLANEOUS) IMPLANT
SWAB CULTURE ESWAB REG 1ML (MISCELLANEOUS) IMPLANT
SYR 3ML LL SCALE MARK (SYRINGE) ×4 IMPLANT
TOWEL OR 17X26 10 PK STRL BLUE (TOWEL DISPOSABLE) ×4 IMPLANT
TRAY FOLEY W/METER SILVER 16FR (SET/KITS/TRAYS/PACK) ×4 IMPLANT
TUBING EXTENTION W/L.L. (IV SETS) ×4 IMPLANT
UNDERPAD 30X30 (UNDERPADS AND DIAPERS) ×4 IMPLANT
WATER STERILE IRR 1000ML POUR (IV SOLUTION) ×4 IMPLANT

## 2016-08-19 NOTE — Progress Notes (Signed)
  Day of Surgery Note    Subjective:  Sleepy but awakes to voice.  No complaints.  Vitals:   08/19/16 1230 08/19/16 1245  BP:    Pulse: (!) 51 (!) 51  Resp: 19 16  Temp:      Incisions:  Below knee incision is clean and dry; bandage on left foot is clean and dry Extremities:  +monophasic left AT doppler signal; +brisk doppler signal above below knee incision Cardiac:  regular Lungs:  Non labored   Assessment/Plan:  This is a 76 y.o. male who is s/p left below knee popliteal artery to peroneal artery bypass with vein graft, intraoperative arteriogram and amputation of left great toe.  -pt doing well in pacu with +left AT doppler signal and brisk signal in the bypass graft -bandage on left foot is clean and dry -to 4 east when bed available.    Doreatha Massed, PA-C 08/19/2016 1:04 PM [phone]

## 2016-08-19 NOTE — Progress Notes (Signed)
Orthopedic Tech Progress Note Patient Details:  Terry Green Apr 03, 1940 [id number]  Ortho Devices Type of Ortho Device: Darco shoe Ortho Device/Splint Location: lle Ortho Device/Splint Interventions: Application   Eddi Hymes 08/19/2016, 1:51 PM

## 2016-08-19 NOTE — H&P (View-Only) (Signed)
Patient name: Terry Green MRN: [id number] DOB: 1940/08/02 Sex: male  REASON FOR VISIT:    Follow up of left great toe wound.  HPI:   Terry Green is a 76 y.o. male who I last saw on 07/09/2016. He has had a previous right below the knee amputation. He presented with a wound on the left great toe and underwent an arteriogram which showed severe tibial artery occlusive disease. His only potential target for bypass was the peroneal artery. At the time of his last visit he had some slight drainage from the left great toe with no erythema. I started him on Levaquin. I instructed the nursing facility to soak the foot daily and lukewarm dial soap soaks. If the wound did not improve the only option would be a popliteal to peroneal artery bypass based on arteriogram that was done 7 months ago.  Since I saw him last, the wound on the left great toe has worsened. He has a large wound on the left great toe. He denies fever or chills. He has been soaking the foot and has been on po antibiotics.  He denies any history of myocardial infarction or history of congestive heart failure. He denies any chest pain or chest pressure.  Past Medical History:  Diagnosis Date  . Arthritis    "hands" (06/06/2014)  . Bipolar disorder (HCC)   . Hypercholesteremia   . Hypertension   . PAD (peripheral artery disease) (HCC)   . Peripheral vascular disease (HCC)   . Rheumatoid arthritis (HCC)   . Type II diabetes mellitus (HCC)     Family History  Problem Relation Age of Onset  . Heart disease Father     SOCIAL HISTORY: Social History  Substance Use Topics  . Smoking status: Former Smoker    Packs/day: 1.00    Years: 54.00    Types: Cigarettes    Quit date: 05/25/2012  . Smokeless tobacco: Never Used  . Alcohol use 0.0 oz/week     Comment: 06/06/2014 "I drank 1/2 pint whiskey each day;  nothing in 20 yrs"    Allergies  Allergen Reactions  . Lisinopril Swelling    Current Outpatient Prescriptions   Medication Sig Dispense Refill  . amLODipine (NORVASC) 10 MG tablet Take 10 mg by mouth at bedtime.     Marland Kitchen aspirin 81 MG chewable tablet Chew 81 mg by mouth daily.    . busPIRone (BUSPAR) 15 MG tablet Take 15 mg by mouth at bedtime.    . carvedilol (COREG) 12.5 MG tablet Take 12.5 mg by mouth 2 (two) times daily with a meal.    . clopidogrel (PLAVIX) 75 MG tablet Take 75 mg by mouth daily.    . divalproex (DEPAKOTE ER) 250 MG 24 hr tablet Take 250 mg by mouth 2 (two) times daily.    Marland Kitchen doxycycline (DORYX) 100 MG EC tablet Take 100 mg by mouth 2 (two) times daily.    Marland Kitchen gabapentin (NEURONTIN) 300 MG capsule Take 300 mg by mouth 3 (three) times daily.    Marland Kitchen HYDROcodone-acetaminophen (NORCO) 7.5-325 MG per tablet Take 1 tablet by mouth every 4 (four) hours as needed for moderate pain. 30 tablet 0  . insulin aspart (NOVOLOG) 100 UNIT/ML FlexPen Inject 0-20 Units into the skin 2 (two) times daily. 6:30am and 4pm per sliding scale:  CBG 150-249 3 units, 250-349 5 units, 350-449 8 units, 450-549 14 units, 550-551 20 units >551 call MD    . Insulin Glargine (LANTUS) 100  UNIT/ML Solostar Pen Inject 15 Units into the skin at bedtime.     Marland Kitchen levofloxacin (LEVAQUIN) 500 MG tablet Take 1 tablet (500 mg total) by mouth daily. 14 tablet 1  . loratadine (CLARITIN) 10 MG tablet Take 10 mg by mouth daily.    . memantine (NAMENDA) 10 MG tablet Take 10 mg by mouth 2 (two) times daily.    . metFORMIN (GLUCOPHAGE) 1000 MG tablet Take 1,000 mg by mouth 2 (two) times daily with a meal.     . Multiple Vitamins-Minerals (MULTIVITAMIN WITH MINERALS) tablet Take 1 tablet by mouth daily.    Marland Kitchen oxybutynin (DITROPAN-XL) 10 MG 24 hr tablet Take 10 mg by mouth at bedtime.    . risperiDONE (RISPERDAL) 0.5 MG tablet Take 0.5 mg by mouth at bedtime.    . sertraline (ZOLOFT) 100 MG tablet Take 100 mg by mouth daily.    Marland Kitchen sulfamethoxazole-trimethoprim (BACTRIM DS,SEPTRA DS) 800-160 MG tablet Take 1 tablet by mouth 2 (two) times daily.      . tamsulosin (FLOMAX) 0.4 MG CAPS capsule Take 0.4 mg by mouth at bedtime.     . vitamin C (ASCORBIC ACID) 500 MG tablet Take 500 mg by mouth 2 (two) times daily.    Marland Kitchen alum & mag hydroxide-simeth (MYLANTA) 200-200-20 MG/5ML suspension Take 15 mLs by mouth every 6 (six) hours as needed for indigestion or heartburn.    Marland Kitchen LORazepam (ATIVAN) 1 MG tablet Take 1 mg by mouth every 12 (twelve) hours as needed.     . magnesium hydroxide (MILK OF MAGNESIA) 400 MG/5ML suspension Take 30 mLs by mouth daily as needed for mild constipation.     No current facility-administered medications for this visit.     REVIEW OF SYSTEMS:  [X]  denotes positive finding, [ ]  denotes negative finding Cardiac  Comments:  Chest pain or chest pressure:    Shortness of breath upon exertion:    Short of breath when lying flat:    Irregular heart rhythm:        Vascular    Pain in calf, thigh, or hip brought on by ambulation:    Pain in feet at night that wakes you up from your sleep:     Blood clot in your veins:    Leg swelling:         Pulmonary    Oxygen at home:    Productive cough:     Wheezing:         Neurologic    Sudden weakness in arms or legs:     Sudden numbness in arms or legs:     Sudden onset of difficulty speaking or slurred speech:    Temporary loss of vision in one eye:     Problems with dizziness:         Gastrointestinal    Blood in stool:     Vomited blood:         Genitourinary    Burning when urinating:     Blood in urine:        Psychiatric    Major depression:         Hematologic    Bleeding problems:    Problems with blood clotting too easily:        Skin    Rashes or ulcers: X Left great toe       Constitutional    Fever or chills:     PHYSICAL EXAM:   Vitals:   08/06/16 0945  BP: 127/71  Pulse: 61  Resp: 20  Temp: 98.1 F (36.7 C)  TempSrc: Oral  SpO2: 98%  Weight: 175 lb (79.4 kg)  Height: 6\' 1"  (1.854 m)    GENERAL: The patient is a well-nourished  male, in no acute distress. The vital signs are documented above. CARDIAC: There is a regular rate and rhythm.  VASCULAR: I do not detect carotid bruits. On the left side, he has a palpable femoral and popliteal pulse. He has a peroneal signal with the Doppler. PULMONARY: There is good air exchange bilaterally without wheezing or rales. ABDOMEN: Soft and non-tender with normal pitched bowel sounds.  MUSCULOSKELETAL: He has a right below the knee amputation. NEUROLOGIC: No focal weakness or paresthesias are detected. SKIN: He has a large wound on his left great toe which is not improving. PSYCHIATRIC: The patient has a normal affect.  DATA:    ARTERIOGRAM:  I have reviewed the arteriogram it was performed by Dr. fields on 12/21/2015. This shows occlusion of the popliteal artery below the knee with single-vessel runoff via the peroneal artery which reconstitutes in the mid calf.  VEIN MAP: I did review his vein map that was done on 12/28/2015. This shows that the saphenous vein appears to be reasonable in size throughout the thigh down to the distal calf.  MEDICAL ISSUES:   CRITICAL LIMB ISCHEMIA LEFT LOWER EXTREMITY: This patient presents with an extensive nonhealing wound of the left great toe and severe tibial artery occlusive disease. I think that without revascularization he will require a below the knee amputation on the left. He has a below the knee amputation on the right and is ambulatory with a prosthesis. He would like to try to save the left limb if at all possible so that he can remain ambulatory and I think that this is perfectly reasonable. I think his best chance for limb salvage would be a popliteal to peroneal artery bypass with a vein graft.   I have reviewed the indications for lower extremity bypass. I have also reviewed the potential complications of surgery including but not limited to: wound healing problems, infection, graft thrombosis, limb loss, or other  unpredictable medical problems. All the patient's questions were answered and they are agreeable to proceed. We will hold his Plavix 5 days prior to the procedure. His surgery is scheduled for 08/19/2016.   10/19/2016 [facility] [phone]

## 2016-08-19 NOTE — Care Management Note (Signed)
Case Management Note  Patient Details  Name: Terry Green MRN: [id number] Date of Birth: 18-Jan-1941  Subjective/Objective:  From [facility] , SNF  s/p left below knee popliteal artery to peroneal artery bypass with vein graft, intraoperative arteriogram and amputation of left great toe.  CSW referral.    PCP Wende Crease               Action/Plan: NCM will follow along with CSW for dc needs.  Expected Discharge Date:                  Expected Discharge Plan:  Skilled Nursing Facility  In-House Referral:  Clinical Social Work  Discharge planning Services  CM Consult  Post Acute Care Choice:    Choice offered to:     DME Arranged:    DME Agency:     HH Arranged:    HH Agency:     Status of Service:  In process, will continue to follow  If discussed at Long Length of Stay Meetings, dates discussed:    Additional Comments:  Leone Haven, RN 08/19/2016, 5:19 PM

## 2016-08-19 NOTE — Progress Notes (Signed)
Patient arrived to unit from PACU with a rectal temperature of 95.30F. RN paged Lelon Mast, [place] and received orders to place patient on bear hugger. RN will continue to monitor.

## 2016-08-19 NOTE — Transfer of Care (Signed)
Immediate Anesthesia Transfer of Care Note  Patient: ROBB SIBAL  Procedure(s) Performed: Procedure(s): LEFT above the knee POPLITEAL artery  TO PERONEAL artery BYPASS GRAFT with intraoperative arteriogram (Left) AMPUTATION LEFT GREAT TOE (Left)  Patient Location: PACU  Anesthesia Type:General  Level of Consciousness: awake, alert , oriented and patient cooperative  Airway & Oxygen Therapy: Patient Spontanous Breathing and Patient connected to nasal cannula oxygen  Post-op Assessment: Report given to RN and Post -op Vital signs reviewed and stable  Post vital signs: Reviewed and stable  Last Vitals:  Vitals:   08/19/16 0652  BP: (!) 131/58  Pulse: (!) 56  Resp: 20  Temp: 36.9 C    Last Pain:  Vitals:   08/19/16 0652  TempSrc: Oral      Patients Stated Pain Goal: 3 (08/19/16 0715)  Complications: No apparent anesthesia complications

## 2016-08-19 NOTE — Op Note (Signed)
NAME: Terry Green    MRN: [id number] DOB: Jan 23, 1941    DATE OF OPERATION: 08/19/2016  PREOP DIAGNOSIS:    Critical limb ischemia left lower extremity  POSTOP DIAGNOSIS:    Same  PROCEDURE:    1. LEFT BELOW KNEE POPLITEAL ARTERY TO PERONEAL ARTERY BYPASS WITH VEIN GRAFT 2. INTRAOPERATIVE ARTERIOGRAM 3. AMPUTATION LEFT GREAT TOE   SURGEON: Di Kindle. Edilia Bo, MD, FACS  ASSIST: Karsten Ro, [facility]  ANESTHESIA: Gen.   EBL: Minimal  INDICATIONS:    Terry Green is a 76 y.o. male with a nonhealing wound on his left great toe. It was felt that his only chance for limb salvage was attempted revascularization. His arteriogram showed a patent popliteal artery below the knee with severe tibial artery occlusive disease. The only artery which reconstituted in the lower leg was the peroneal artery. He presents for a popliteal to peroneal artery bypass  FINDINGS:    The peroneal artery was very small. It only took a 1.5 mm dilator. Completion arteriogram showed no technical problems.  TECHNIQUE:    The patient was taken to the operating room and received a general anesthetic. The left lower extremity was prepped and draped in usual sterile fashion. The great saphenous vein was harvested from just above the knee to the mid calf using 3 incisions along the medial aspect of the left lower extremity. The vein was reasonable quality. Branches were divided between clips and 3-0 silk ties. The vein was ligated distally and proximally and irrigated up with heparinized saline. It was a 3.5-4 mm vein. Through the incision below the knee dissection was carried down to the below-knee popliteal artery which had a good pulse. Through the distal incision the dissection was carried down to the soleus muscle passed through the neurovascular bundle of the posterior tibial artery to the peroneal artery which was at the depths of the wound. The artery was very small. A tunnel was greater from this incision  to the below the knee incision just the knee the fascia. The patient was heparinized. Terms was placed on the upper thigh. The leg was exsanguinated with an Esmarch bandage. The tourniquet was inflated to 300 mmHg. Under tourniquet control, a longitudinal arteriotomy was made in the below-knee popliteal artery. The vein was used in a reversed fashion as the size was consistent throughout the length of the vein. The vein was spatulated and sewn end to side to the below-knee popliteal artery using continuous 6-0 Prolene suture. Tourniquet was then released and it was excellent flow through the graft which was then marked with a twisting.  The vein graft was then brought through the tunnel for anastomosis to the peroneal artery. The leg was exsanguinated again and the tourniquet reinflated. The artery was opened longitudinally. The vein graft was cut to appropriate length, spatulated, and sewn end to side to the peroneal artery using continuous 6-0 Prolene suture. Prior to completing this anastomosis the artery was backbled and flushed properly in the anastomosis completed. Flow was reestablished on the left leg. There was excellent flow in the peroneal artery distal to the anastomosis. An intraoperative arteriogram was obtained by cannulating the vein graft to the distal incision. This showed no technical problems. The heparin was partially reversed with protamine. Each of the wounds was closed with a deep 3-0 Vicryl and the skin closed with 4-0 Vicryl.   Attention was then turned to amputation of the left great toe. A fishmouth incision was made encompassing the toe  and the proximal phalanx divided using the saw. Hemostasis was obtained using electrocautery. The wound was then closed with interrupted 3-0 nylon's. Sterile dressing was applied. The patient tolerated the procedure well and was transferred to the recovery room in stable condition. All needle and sponge counts were correct.    [name],  MD, FACS [facility]  DATE OF DICTATION:   08/19/2016

## 2016-08-19 NOTE — Interval H&P Note (Signed)
History and Physical Interval Note:  08/19/2016 7:24 AM  Terry Green  has presented today for surgery, with the diagnosis of Peripheral vascular disease with nonhealing wounds  The various methods of treatment have been discussed with the patient and family. After consideration of risks, benefits and other options for treatment, the patient has consented to  Procedure(s): BYPASS GRAFT POPLITEAL TO PERONEAL (Left) AMPUTATION LEFT GREAT TOE (Left) as a surgical intervention .  The patient's history has been reviewed, patient examined, no change in status, stable for surgery.  I have reviewed the patient's chart and labs.  Questions were answered to the patient's satisfaction.     [name]

## 2016-08-19 NOTE — Anesthesia Preprocedure Evaluation (Signed)
Anesthesia Evaluation  Patient identified by MRN, date of birth, ID band Patient awake    Reviewed: Allergy & Precautions, NPO status , Patient's Chart, lab work & pertinent test results  History of Anesthesia Complications Negative for: history of anesthetic complications  Airway Mallampati: II  TM Distance: >3 FB Neck ROM: Full    Dental  (+) Edentulous Upper, Edentulous Lower   Pulmonary neg shortness of breath, neg sleep apnea, neg COPD, neg recent URI, former smoker,    breath sounds clear to auscultation       Cardiovascular hypertension, Pt. on medications + CAD and + Peripheral Vascular Disease   Rhythm:Regular  EKG 2014 LVH, had ECHO and STRESS 2008 results not available   Neuro/Psych PSYCHIATRIC DISORDERS Anxiety Depression Bipolar Disorder Schizophrenia    GI/Hepatic negative GI ROS, Neg liver ROS,   Endo/Other  diabetes, Type 2, Insulin Dependent  Renal/GU      Musculoskeletal  (+) Arthritis ,   Abdominal (+)  Abdomen: soft.    Peds  Hematology  (+) anemia ,   Anesthesia Other Findings   Reproductive/Obstetrics                             Anesthesia Physical Anesthesia Plan  ASA: III  Anesthesia Plan: General   Post-op Pain Management:    Induction: Intravenous  PONV Risk Score and Plan: 2 and Ondansetron, Dexamethasone and Treatment may vary due to age  Airway Management Planned: Oral ETT  Additional Equipment: None  Intra-op Plan:   Post-operative Plan: Extubation in OR  Informed Consent: I have reviewed the patients History and Physical, chart, labs and discussed the procedure including the risks, benefits and alternatives for the proposed anesthesia with the patient or authorized representative who has indicated his/her understanding and acceptance.   Dental advisory given  Plan Discussed with: CRNA and Surgeon  Anesthesia Plan Comments:          Anesthesia Quick Evaluation

## 2016-08-19 NOTE — Progress Notes (Signed)
RN unable to complete admission assessment due to patient being lethargic post surgery and no family present at this time.

## 2016-08-19 NOTE — Anesthesia Procedure Notes (Signed)
Procedure Name: Intubation Date/Time: 08/19/2016 7:42 AM Performed by: Shirlyn Goltz Pre-anesthesia Checklist: Patient identified, Emergency Drugs available, Suction available and Patient being monitored Patient Re-evaluated:Patient Re-evaluated prior to inductionOxygen Delivery Method: Circle system utilized Preoxygenation: Pre-oxygenation with 100% oxygen Intubation Type: IV induction Ventilation: Mask ventilation without difficulty Laryngoscope Size: Mac and 3 Grade View: Grade I Tube type: Oral Tube size: 7.5 mm Number of attempts: 1 Airway Equipment and Method: Stylet Placement Confirmation: ETT inserted through vocal cords under direct vision,  positive ETCO2 and breath sounds checked- equal and bilateral Secured at: 21 cm Tube secured with: Tape Dental Injury: Teeth and Oropharynx as per pre-operative assessment

## 2016-08-20 ENCOUNTER — Encounter (HOSPITAL_COMMUNITY): Payer: Self-pay | Admitting: Vascular Surgery

## 2016-08-20 ENCOUNTER — Inpatient Hospital Stay (HOSPITAL_COMMUNITY): Payer: Medicare Other

## 2016-08-20 LAB — GLUCOSE, CAPILLARY
GLUCOSE-CAPILLARY: 143 mg/dL — AB (ref 65–99)
GLUCOSE-CAPILLARY: 133 mg/dL — AB (ref 65–99)
Glucose-Capillary: 95 mg/dL (ref 65–99)
Glucose-Capillary: 170 mg/dL — ABNORMAL HIGH (ref 65–99)

## 2016-08-20 LAB — BASIC METABOLIC PANEL
ANION GAP: 7 (ref 5–15)
BUN: 10 mg/dL (ref 6–20)
CALCIUM: 7.9 mg/dL — AB (ref 8.9–10.3)
CO2: 27 mmol/L (ref 22–32)
CREATININE: 0.86 mg/dL (ref 0.61–1.24)
Chloride: 104 mmol/L (ref 101–111)
GFR calc Af Amer: >60 mL/min (ref >60)
GLUCOSE: 101 mg/dL — AB (ref 65–99)
Potassium: 4.1 mmol/L (ref 3.5–5.1)
Sodium: 138 mmol/L (ref 135–145)

## 2016-08-20 LAB — CBC
HEMATOCRIT: 21.6 % — AB (ref 39.0–52.0)
Hemoglobin: 6.7 g/dL — CL (ref 13.0–17.0)
MCH: 22.1 pg — AB (ref 26.0–34.0)
MCHC: 31 g/dL (ref 30.0–36.0)
MCV: 71.3 fL — AB (ref 78.0–100.0)
PLATELETS: 195 10*3/uL (ref 150–400)
RBC: 3.03 MIL/uL — ABNORMAL LOW (ref 4.22–5.81)
RDW: 22.4 % — AB (ref 11.5–15.5)
WBC: 5.9 10*3/uL (ref 4.0–10.5)

## 2016-08-20 LAB — PREPARE RBC (CROSSMATCH)

## 2016-08-20 MED ORDER — SODIUM CHLORIDE 0.9 % IV SOLN
Freq: Once | INTRAVENOUS | Status: AC
  Administered 2016-08-20: 09:00:00 via INTRAVENOUS

## 2016-08-20 MED ORDER — SODIUM CHLORIDE 0.9% FLUSH: 10.0000 mL | INTRAVENOUS | Status: DC | PRN

## 2016-08-20 MED ORDER — SODIUM CHLORIDE 0.9% FLUSH
10.0000 mL | Freq: Two times a day (BID) | INTRAVENOUS | Status: DC
Start: 2016-08-20 — End: 2016-08-21

## 2016-08-20 NOTE — Plan of Care (Signed)
Problem: Pain Managment: Goal: General experience of comfort will improve Outcome: Progressing Denies any pain at this time.   

## 2016-08-20 NOTE — Progress Notes (Signed)
Patient arrived to 2W room 10.  Telemetry monitor applied and CCMD notified.  Patient oriented to unit and room to include call light and phone.  Will continue to monitor.

## 2016-08-20 NOTE — Evaluation (Signed)
Physical Therapy Evaluation Patient Details Name: Terry Green MRN: [id number] DOB: 24-Jan-1941 Today's Date: 08/20/2016   History of Present Illness  Patient is a 76 y/o male admitted due to L great toe wound and severe tibial artery occlusive disease now s/p left below knee popliteal artery to peroneal artery bypass with vein graft, intraoperative arteriogram and amputation of left great toe.  PMH positive for DM, PAD, PVD, HTNm bipolar and RA.  Clinical Impression  Patient presents with decreased mobility due to deficits listed in PT problem listed below and will benefit from skilled PT in the acute setting prior to d/c back to SNF level rehab.     Follow Up Recommendations SNF    Equipment Recommendations  None recommended by PT    Recommendations for Other Services       Precautions / Restrictions Precautions Precautions: Fall Precaution Comments: R BKA with prosthesis Required Braces or Orthoses: Other Brace/Splint Other Brace/Splint: heel wedge darco L LE Restrictions Weight Bearing Restrictions: Yes LLE Weight Bearing: Partial weight bearing LLE Partial Weight Bearing Percentage or Pounds: on heel only      Mobility  Bed Mobility Overal bed mobility: Needs Assistance Bed Mobility: Supine to Sit     Supine to sit: Mod assist     General bed mobility comments: able to initiate bringing legs off bed, assist to finish lifting trunk and to scoot to EOB  Transfers Overall transfer level: Needs assistance Equipment used: Rolling walker (2 wheeled) (prosthetic) Transfers: Sit to/from [employer] Sit to Stand: +2 physical assistance;Max assist Stand pivot transfers: +2 physical assistance;Mod assist       General transfer comment: lifting and lowering assist, with RW assist for safety, balance due to posterior lean and very slow to take steps; sat with uncontrolled descent  Ambulation/Gait                Stairs            Wheelchair  Mobility    Modified Rankin (Stroke Patients Only)       Balance Overall balance assessment: Needs assistance Sitting-balance support: Bilateral upper extremity supported Sitting balance-Leahy Scale: Poor Sitting balance - Comments: UE support sitting EOB   Standing balance support: Bilateral upper extremity supported Standing balance-Leahy Scale: Poor Standing balance comment: UE support and physical assist for balance                             Pertinent Vitals/Pain Pain Assessment: Faces Faces Pain Scale: Hurts even more Pain Location: L knee with stand to sit (uncontrolled) Pain Descriptors / Indicators: Grimacing;Guarding Pain Intervention(s): Monitored during session;Repositioned    Home Living Family/patient expects to be discharged to:: Skilled nursing facility                 Additional Comments: resident of [facility]    Prior Function Level of Independence: Needs assistance   Gait / Transfers Assistance Needed: poor historian as reported from home with wife and does not even have a w/c so PLOF unknown           Hand Dominance        Extremity/Trunk Assessment   Upper Extremity Assessment Upper Extremity Assessment: Defer to OT evaluation    Lower Extremity Assessment Lower Extremity Assessment: RLE deficits/detail;LLE deficits/detail RLE Deficits / Details: BKA moves well without noted restrictions RLE Sensation: history of peripheral neuropathy LLE Deficits / Details: moves well despite incision  medial thigh and lower leg, wrapping on L foot  LLE Sensation: history of peripheral neuropathy    Cervical / Trunk Assessment Cervical / Trunk Assessment: Kyphotic  Communication   Communication: No difficulties  Cognition Arousal/Alertness: Awake/alert Behavior During Therapy: WFL for tasks assessed/performed Overall Cognitive Status: No family/caregiver present to determine baseline cognitive functioning                                         General Comments      Exercises     Assessment/Plan    PT Assessment Patient needs continued PT services  PT Problem List Decreased strength;Decreased balance;Decreased mobility;Decreased activity tolerance;Decreased knowledge of use of DME       PT Treatment Interventions DME instruction;Therapeutic activities;Therapeutic exercise;Patient/family education;Balance training;Functional mobility training    PT Goals (Current goals can be found in the Care Plan section)  Acute Rehab PT Goals Patient Stated Goal: Happy to be OOB PT Goal Formulation: Patient unable to participate in goal setting Time For Goal Achievement: 08/27/16 Potential to Achieve Goals: Fair    Frequency Min 2X/week   Barriers to discharge        Co-evaluation               AM-PAC PT "6 Clicks" Daily Activity  Outcome Measure Difficulty turning over in bed (including adjusting bedclothes, sheets and blankets)?: A Little Difficulty moving from lying on back to sitting on the side of the bed? : Total Difficulty sitting down on and standing up from a chair with arms (e.g., wheelchair, bedside commode, etc,.)?: Total Help needed moving to and from a bed to chair (including a wheelchair)?: A Lot Help needed walking in hospital room?: Total Help needed climbing 3-5 steps with a railing? : Total 6 Click Score: 9    End of Session Equipment Utilized During Treatment: Gait belt Activity Tolerance: Patient limited by fatigue Patient left: in chair;with call bell/phone within reach;with nursing/sitter in room Nurse Communication: Mobility status PT Visit Diagnosis: Muscle weakness (generalized) (M62.81);Difficulty in walking, not elsewhere classified (R26.2)    Time: 1610-9604 PT Time Calculation (min) (ACUTE ONLY): 28 min   Charges:   PT Evaluation $PT Eval Moderate Complexity: 1 Procedure PT Treatments $Therapeutic Activity: 8-22 mins   PT G CodesSheran Lawless, [place] [phone] 08/20/2016   Elray Mcgregor 08/20/2016, 1:12 PM

## 2016-08-20 NOTE — Progress Notes (Signed)
Pt temperature post first unit of blood was 100.1. Spoke with Dr. Edilia Bo. Given verbal order to give PRN tylenol and recheck temperature in an hour. If the temperature is fine at that time, administer second unit of blood. If temperature still elevated, page PA as Dr. Edilia Bo is in the office today.   Primary RN Candise Bowens updated.   Leonidas Romberg, RN

## 2016-08-20 NOTE — Clinical Social Work Note (Signed)
Clinical Social Work Assessment  Patient Details  Name: Terry Green MRN: [id number] Date of Birth: 08-29-1940  Date of referral:  08/20/16               Reason for consult:  Discharge Planning                Permission sought to share information with:  Chartered certified accountant granted to share information::  Yes, Verbal Permission Granted  Name::        Agency::  [facility]  Relationship::     Contact Information:     Housing/Transportation Living arrangements for the past 2 months:  [place] of Information:  Patient, Scientist, water quality, Facility Patient Interpreter Needed:  None Criminal Activity/Legal Involvement Pertinent to Current Situation/Hospitalization:  No - Comment as needed Significant Relationships:  Siblings, Adult Children Lives with:  Facility Resident Do you feel safe going back to the place where you live?  Yes Need for family participation in patient care:  Yes (Comment)  Care giving concerns:  Patient is a long-term resident at [facility].   Social Worker assessment / plan:  CSW met with patient. No supports at bedside. CSW introduced role and explained that discharge planning would be discussed. Patient confirmed that he is from [facility] SN[facility] and plans to return once stable for discharge. Patient states that he has been living there for over a year. CSW confirmed with SNF hospital liason that patient can return once stable. No further concerns. CSW encouraged patient to contact CSW as needed. CSW will continue to follow patient for support and facilitate discharge back to SNF once medically stable.  Employment status:  Retired Forensic scientist:  Medicare PT Recommendations:  Not assessed at this time [place] / Referral to community resources:  [place]  Patient/Family's Response to care:  Patient agreeable to return to SNF. Patient's daughter and sister supportive and  involved in patient's care. Patient appreciated social work intervention.  Patient/Family's Understanding of and Emotional Response to Diagnosis, Current Treatment, and Prognosis:  Patient has a good understanding of the reason for admission and his need to return to SNF. Patient appears happy with hospital care.  Emotional Assessment Appearance:  Appears stated age Attitude/Demeanor/Rapport:  Other (Pleasant) Affect (typically observed):  Accepting, Appropriate, Calm, Pleasant Orientation:  Oriented to Self, Oriented to Place, Oriented to  Time, Oriented to Situation Alcohol / Substance use:  Never Used Psych involvement (Current and /or in the community):  No (Comment)  Discharge Needs  Concerns to be addressed:  Care Coordination Readmission within the last 30 days:  No Current discharge risk:  None Barriers to Discharge:  Continued Medical Work up   Candie Chroman, LCSW 08/20/2016, 12:16 PM

## 2016-08-20 NOTE — Anesthesia Postprocedure Evaluation (Signed)
Anesthesia Post Note  Patient: Terry Green  Procedure(s) Performed: Procedure(s) (LRB): LEFT above the knee POPLITEAL artery  TO PERONEAL artery BYPASS GRAFT with intraoperative arteriogram (Left) AMPUTATION LEFT GREAT TOE (Left)     Patient location during evaluation: PACU Anesthesia Type: General Level of consciousness: awake and alert Pain management: pain level controlled Vital Signs Assessment: post-procedure vital signs reviewed and stable Respiratory status: spontaneous breathing, nonlabored ventilation, respiratory function stable and patient connected to nasal cannula oxygen Cardiovascular status: blood pressure returned to baseline and stable Postop Assessment: no signs of nausea or vomiting Anesthetic complications: no    Last Vitals:  Vitals:   08/20/16 0913 08/20/16 1055  BP: (!) 131/52 (!) 145/57  Pulse: 71 65  Resp: 17 18  Temp: 37.1 C 37.8 C    Last Pain:  Vitals:   08/20/16 1055  TempSrc: Oral  PainSc:                  Nawaf Strange

## 2016-08-20 NOTE — Progress Notes (Signed)
CRITICAL VALUE ALERT  Critical Value:  Hemoglobin 6.7  Date & Time Notied: 08/20/16 at 0608  Provider Notified: Dr. Edilia Bo  Orders Received/Actions taken: Stated he will be up to see the patient in just a few.   *Provider up to see patient at this time. Stated he will order some blood for the patient.

## 2016-08-20 NOTE — Progress Notes (Signed)
   VASCULAR SURGERY ASSESSMENT & PLAN:   1 Day Post-Op s/p: Left below-knee popliteal to peroneal artery bypass and left great toe amputation.  His graft is patent with strong Doppler signals in the graft and also an anterior tibial and peroneal signal with the Doppler.  The patient has had some hypotension. His hemoglobin started at 8.4 and has drifted down to 6.7. Given his acute blood loss anemia I have recommended transfusion with 2 units of pack red blood cells and have discussed this with him.  Begin daily dressing changes to left great toe amputation site daily.  Transferred to 2 W.  SUBJECTIVE:   Comfortable. No specific complaints.  PHYSICAL EXAM:   Vitals:   08/19/16 2000 08/19/16 2233 08/20/16 0417 08/20/16 0600  BP: (!) 124/55 (!) 130/52 (!) 101/46 (!) 100/45  Pulse: 61 64 62 63  Resp: 15 13 14 14   Temp:  98.6 F (37 C) 99.7 F (37.6 C)   TempSrc:  Oral Oral   SpO2: 100% 95% 92% 91%  Height:       Incisions look fine. Biphasic Doppler signal in the bypass graft. Monophasic anterior tibial and peroneal signal with the Doppler. Dressing over toe amputation site with minimal drainage.  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 amLODipine  5 mg Oral Daily  . aspirin EC  81 mg Oral Daily  . busPIRone  15 mg Oral QPM  . carvedilol  12.5 mg Oral BID WC  . clopidogrel  75 mg Oral Daily  . divalproex  500 mg Oral BID  . docusate sodium  100 mg Oral Daily  .  enoxaparin (LOVENOX) injection  40 mg Subcutaneous Q24H  . gabapentin  300 mg Oral TID  . insulin aspart  0-9 Units Subcutaneous TID WC  . insulin glargine  15 Units Subcutaneous QHS  . loratadine  10 mg Oral Daily  . mouth rinse  15 mL Mouth Rinse BID  . memantine  10 mg Oral BID  . [START ON 08/21/2016] metFORMIN  1,000 mg Oral BID WC  . multivitamin with minerals  1 tablet Oral Daily  . oxybutynin  10 mg Oral QHS  . pantoprazole  40 mg Oral Daily  . risperiDONE  0.5 mg Oral QHS  . sertraline  100 mg Oral Daily  . tamsulosin  0.4 mg Oral QHS  . vitamin C  500 mg Oral BID    10/21/2016 Beeper: Cari Caraway Office: [phone] 08/20/2016

## 2016-08-20 NOTE — [place] FL2 (Signed)
[place] MEDICAID FL2 LEVEL OF CARE SCREENING TOOL     IDENTIFICATION  Patient Name: Terry Green Birthdate: [birth year] Sex: male Admission Date (Current Location): 08/19/2016  [facility] and [place] Number:  [employer] and Address:  [facility] [place], [address], [place], [place] [address]      Provider Number: [id number]  Attending Physician Name and Address:  Chuck Hint, *  Relative Name and Phone Number:       Current Level of Care: Hospital Recommended Level of Care: Skilled Nursing Facility Prior Approval Number:    Date Approved/Denied:   PASRR Number: [id number]  Discharge Plan: SNF    Current Diagnoses: Patient Active Problem List   Diagnosis Date Noted  . 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 Protein-calorie malnutrition, severe (HCC) 06/08/2014  . PAD (peripheral artery disease) (HCC) 06/06/2014    Orientation RESPIRATION BLADDER Height & Weight     Self, Time, Situation, Place  O2 (Nasal Canula 2 L) Continent Weight:   Height:  6\' 1"  (185.4 cm)  BEHAVIORAL SYMPTOMS/MOOD NEUROLOGICAL BOWEL NUTRITION STATUS   (None)  (None) Continent Diet (Carb modified)  AMBULATORY STATUS COMMUNICATION OF NEEDS Skin   Extensive Assist Verbally Skin abrasions, Other (Comment), Surgical wounds (Amputation, Fissure.)                       Personal Care Assistance Level of Assistance  Bathing, Feeding, Dressing Bathing Assistance: Limited assistance Feeding assistance: Independent Dressing Assistance: Limited assistance     Functional Limitations Info  Sight, Hearing, Speech Sight Info: Adequate Hearing Info: Adequate Speech Info: Adequate    SPECIAL CARE FACTORS FREQUENCY  PT (By licensed PT), OT (By licensed OT)     PT Frequency: 5 x week OT Frequency: 5 x week            Contractures Contractures Info: Not present    Additional  Factors Info  Code Status, Allergies Code Status Info: Full Allergies Info: Lisinopril           Current Medications (08/20/2016):  This is the current hospital active medication list Current Facility-Administered Medications  Medication Dose Route Frequency Provider Last Rate Last Dose  . 0.9 %  sodium chloride infusion  500 mL Intravenous Once PRN Maris Berger A, PA-C      . 0.9 %  sodium chloride infusion   Intravenous Continuous Maris Berger A, PA-C 50 mL/hr at 08/19/16 1500    . acetaminophen (TYLENOL) tablet 1,000 mg  1,000 mg Oral Q8H PRN Maris Berger A, PA-C   1,000 mg at 08/20/16 1111  . alum & mag hydroxide-simeth (MAALOX/MYLANTA) 200-200-20 MG/5ML suspension 15 mL  15 mL Oral Q6H PRN Maris Berger A, PA-C      . amLODipine (NORVASC) tablet 5 mg  5 mg Oral Daily Maris Berger A, PA-C   5 mg at 08/20/16 4580  . aspirin EC tablet 81 mg  81 mg Oral Daily Maris Berger A, PA-C   81 mg at 08/20/16 9983  . busPIRone (BUSPAR) tablet 15 mg  15 mg Oral QPM Trinh, Kimberly A, PA-C      . carvedilol (COREG) tablet 12.5 mg  12.5 mg Oral BID WC Trinh, Kimberly A, PA-C   12.5 mg at 08/20/16 0828  . clopidogrel (PLAVIX) tablet 75 mg  75 mg Oral Daily Maris Berger A, PA-C   75 mg at 08/20/16  3007  . divalproex (DEPAKOTE ER) 24 hr tablet 500 mg  500 mg Oral BID Maris Berger A, PA-C   500 mg at 08/20/16 6226  . docusate sodium (COLACE) capsule 100 mg  100 mg Oral Daily Maris Berger A, PA-C   100 mg at 08/20/16 3335  . enoxaparin (LOVENOX) injection 40 mg  40 mg Subcutaneous Q24H Maris Berger A, PA-C   Stopped at 08/20/16 0800  . gabapentin (NEURONTIN) capsule 300 mg  300 mg Oral TID Maris Berger A, PA-C   300 mg at 08/20/16 0830  . guaiFENesin-dextromethorphan (ROBITUSSIN DM) 100-10 MG/5ML syrup 10 mL  10 mL Oral Q4H PRN Maris Berger A, PA-C      . hydrALAZINE (APRESOLINE) injection 5 mg  5 mg Intravenous Q20 Min PRN Raymond Gurney, PA-C      .  HYDROcodone-acetaminophen (NORCO) 7.5-325 MG per tablet 1-2 tablet  1-2 tablet Oral Q4H PRN Raymond Gurney, PA-C   2 tablet at 08/19/16 2359  . insulin aspart (novoLOG) injection 0-9 Units  0-9 Units Subcutaneous TID WC Rhyne, Samantha J, PA-C   2 Units at 08/20/16 1200  . insulin glargine (LANTUS) injection 15 Units  15 Units Subcutaneous QHS Chuck Hint, MD   15 Units at 08/19/16 2139  . labetalol (NORMODYNE,TRANDATE) injection 10 mg  10 mg Intravenous Q10 min PRN Maris Berger A, PA-C      . loratadine (CLARITIN) tablet 10 mg  10 mg Oral Daily Maris Berger A, PA-C   10 mg at 08/20/16 0829  . magnesium sulfate IVPB 2 g 50 mL  2 g Intravenous Daily PRN Raymond Gurney, PA-C      . MEDLINE mouth rinse  15 mL Mouth Rinse BID Chuck Hint, MD   15 mL at 08/19/16 2200  . memantine (NAMENDA) tablet 10 mg  10 mg Oral BID Maris Berger A, PA-C   10 mg at 08/20/16 4562  . [START ON 08/21/2016] metFORMIN (GLUCOPHAGE) tablet 1,000 mg  1,000 mg Oral BID WC Trinh, Kimberly A, PA-C      . metoprolol tartrate (LOPRESSOR) injection 2-5 mg  2-5 mg Intravenous Q2H PRN Maris Berger A, PA-C      . morphine 2 MG/ML injection 2-5 mg  2-5 mg Intravenous Q1H PRN Maris Berger A, PA-C   4 mg at 08/19/16 2359  . multivitamin with minerals tablet 1 tablet  1 tablet Oral Daily Chuck Hint, MD   1 tablet at 08/20/16 [phone]  . ondansetron (ZOFRAN) injection 4 mg  4 mg Intravenous Q6H PRN Raymond Gurney, PA-C      . oxybutynin (DITROPAN-XL) 24 hr tablet 10 mg  10 mg Oral QHS Maris Berger A, PA-C   10 mg at 08/19/16 2139  . pantoprazole (PROTONIX) EC tablet 40 mg  40 mg Oral Daily Maris Berger A, PA-C   40 mg at 08/20/16 9373  . phenol (CHLORASEPTIC) mouth spray 1 spray  1 spray Mouth/Throat PRN Maris Berger A, PA-C      . potassium chloride SA (K-DUR,KLOR-CON) CR tablet 20-40 mEq  20-40 mEq Oral Daily PRN Maris Berger A, PA-C      . risperiDONE (RISPERDAL) tablet 0.5  mg  0.5 mg Oral QHS Maris Berger A, PA-C   0.5 mg at 08/19/16 2138  . sertraline (ZOLOFT) tablet 100 mg  100 mg Oral Daily Maris Berger A, PA-C   100 mg at 08/20/16 4287  . tamsulosin (FLOMAX) capsule 0.4 mg  0.4 mg Oral  QHS Maris Berger A, PA-C   0.4 mg at 08/19/16 2138  . vitamin C (ASCORBIC ACID) tablet 500 mg  500 mg Oral BID Maris Berger A, PA-C   500 mg at 08/20/16 5056     Discharge Medications: Please see discharge summary for a list of discharge medications.  Relevant Imaging Results:  Relevant Lab Results:   Additional Information SS#: [id number]  Margarito Liner, LCSW

## 2016-08-20 NOTE — Progress Notes (Signed)
OT Cancellation Note  Patient Details Name: TAG WURTZ MRN: [id number] DOB: [birth year]   Cancelled Treatment:    Reason Eval/Treat Not Completed: Pt eating dinner.  Will reattempt.  [employer], OTR/L [phone]   Jeani Hawking M 08/20/2016, 4:42 PM

## 2016-08-21 ENCOUNTER — Inpatient Hospital Stay (HOSPITAL_COMMUNITY): Payer: Medicare Other

## 2016-08-21 ENCOUNTER — Telehealth: Payer: Self-pay | Admitting: Vascular Surgery

## 2016-08-21 DIAGNOSIS — I70235 Atherosclerosis of native arteries of right leg with ulceration of other part of foot: Secondary | ICD-10-CM

## 2016-08-21 LAB — BPAM RBC
BLOOD PRODUCT EXPIRATION DATE: 201806082359
BLOOD PRODUCT EXPIRATION DATE: 201806122359
ISSUE DATE / TIME: 201806060844
ISSUE DATE / TIME: 201806061255
UNIT TYPE AND RH: 1700
Unit Type and Rh: 7300

## 2016-08-21 LAB — CBC
HEMATOCRIT: 28.6 % — AB (ref 39.0–52.0)
HEMOGLOBIN: 9.1 g/dL — AB (ref 13.0–17.0)
MCH: 23.2 pg — ABNORMAL LOW (ref 26.0–34.0)
MCHC: 31.8 g/dL (ref 30.0–36.0)
MCV: 73 fL — ABNORMAL LOW (ref 78.0–100.0)
Platelets: 184 10*3/uL (ref 150–400)
RBC: 3.92 MIL/uL — ABNORMAL LOW (ref 4.22–5.81)
RDW: 22.3 % — ABNORMAL HIGH (ref 11.5–15.5)
WBC: 8.3 10*3/uL (ref 4.0–10.5)

## 2016-08-21 LAB — TYPE AND SCREEN
ABO/RH(D): B POS
ANTIBODY SCREEN: NEGATIVE
UNIT DIVISION: 0
UNIT DIVISION: 0

## 2016-08-21 LAB — GLUCOSE, CAPILLARY
Glucose-Capillary: 117 mg/dL — ABNORMAL HIGH (ref 65–99)
Glucose-Capillary: 148 mg/dL — ABNORMAL HIGH (ref 65–99)

## 2016-08-21 MED ORDER — ATORVASTATIN CALCIUM 10 MG PO TABS: 10.0000 mg | ORAL_TABLET | Freq: Every day | ORAL | 11 refills | Status: DC

## 2016-08-21 MED ORDER — HYDROCODONE-ACETAMINOPHEN 7.5-325 MG PO TABS: 1.0000 | ORAL_TABLET | ORAL | 0 refills | Status: DC | PRN

## 2016-08-21 NOTE — Progress Notes (Signed)
Report called to [facility] at [facility]. All questions answered.   Berdine Dance BSN, RN

## 2016-08-21 NOTE — Progress Notes (Signed)
Clinical Social Worker facilitated patient discharge including contacting patient family and facility to confirm patient discharge plans.  Clinical information faxed to facility and family agreeable with plan.  CSW arranged ambulance transport via [facility] to [facility] .  RN Leotis Shames to call [phone] and ask for RN Betha Loa for report prior to discharge.  Clinical Social Worker will sign off for now as social work intervention is no longer needed. Please consult [place] again if new need arises.  Marrianne Mood, MSW, [employer] [phone]

## 2016-08-21 NOTE — Progress Notes (Signed)
VASCULAR LAB PRELIMINARY  ARTERIAL  ABI completed: Unable to obtain right ABI due to below the knee amputation. Left ABI of 0.87 is suggestive of mild arterial occlusive disease at rest. Previous ABI obtained on 06/23/2016 demonstrated a value of 0.7 on the left.   RIGHT    LEFT    PRESSURE WAVEFORM  PRESSURE WAVEFORM  BRACHIAL IV Triphasic BRACHIAL 159 Triphasic  DP BKA  DP 138 Monophasic  PT BKA  PT 137 Monophasic    RIGHT LEFT  ABI  0.87     Elsie Stain, RVT 08/21/2016, 8:54 AM

## 2016-08-21 NOTE — Progress Notes (Signed)
Pt discharged to [facility] by [facility]. Pt left with all of his belongings.   Berdine Dance BSN, RN

## 2016-08-21 NOTE — Clinical Social Work Placement (Signed)
   CLINICAL SOCIAL WORK PLACEMENT  NOTE  Date:  08/21/2016  Patient Details  Name: Terry Green MRN: [id number] Date of Birth: 11-May-1940  Clinical Social Work is seeking post-discharge placement for this patient at the Skilled  Nursing Facility level of care (*CSW will initial, date and re-position this form in  chart as items are completed):  No   Patient/family provided with [facility] Clinical Social Work Department's list of facilities offering this level of care within the geographic area requested by the patient (or if unable, by the patient's family).  No   Patient/family informed of their freedom to choose among providers that offer the needed level of care, that participate in Medicare, Medicaid or managed care program needed by the patient, have an available bed and are willing to accept the patient.  No   Patient/family informed of [facility]'s ownership interest in [facility] and [facility], as well as of the fact that they are under no obligation to receive care at these facilities.  PASRR submitted to EDS on       PASRR number received on       Existing PASRR number confirmed on 08/21/16     FL2 transmitted to all facilities in geographic area requested by pt/family on       FL2 transmitted to all facilities within larger geographic area on       Patient informed that his/her managed care company has contracts with or will negotiate with certain facilities, including the following:        No   Patient/family informed of bed offers received.  Patient chooses bed at [facility] (pt is a long term resident at [facility])     Physician recommends and patient chooses bed at      Patient to be transferred to [facility] on 08/21/16.  Patient to be transferred to facility by [facility]     Patient family notified on 08/21/16 of transfer.  Name of family member notified:  attempted to contact [facility]     PHYSICIAN       Additional  Comment:    _______________________________________________ Althea Charon, LCSW 08/21/2016, 12:51 PM

## 2016-08-21 NOTE — Progress Notes (Signed)
OT Cancellation Note  Patient Details Name: Terry Green MRN: [id number] DOB: 1940-05-26   Cancelled Treatment:    Reason Eval/Treat Not Completed:  Pt discharging to SNF. Will defer OT to SNF.  Evern Bio 08/21/2016, 1:25 PM  [phone]

## 2016-08-21 NOTE — Progress Notes (Addendum)
[facility] Progress Note  Subjective  - POD #2  No complaints. Ready to go back to facility.   Objective Vitals:   08/21/16 0417 08/21/16 0752  BP: (!) 143/60 (!) 155/60  Pulse: (!) 57 62  Resp: 17   Temp: 98.4 F (36.9 C)     Intake/Output Summary (Last 24 hours) at 08/21/16 0854 Last data filed at 08/21/16 0647  Gross per 24 hour  Intake             1537 ml  Output             1000 ml  Net              537 ml   Left leg incisions clean and intact. No hematoma. Monophasic left peroneal and AT signals.  Left great toe amputation site below. Old clot present at incision line. Sutures intact. No active bleeding.    DATA:  ABIs 08/21/16 Previously 0.7 on the left (06/23/16)   RIGHT    LEFT    PRESSURE WAVEFORM  PRESSURE WAVEFORM  BRACHIAL IV Triphasic BRACHIAL 159 Triphasic  DP BKA  DP 138 Monophasic  PT BKA  PT 137 Monophasic    RIGHT LEFT  ABI  0.87    Assessment/Planning: 76 y.o. male is s/p: Left below-knee popliteal to peroneal artery bypass and left great toe amputation.  2 Days Post-Op   Amputation site healing. Good doppler signals at ankle.  ABLA: Received 2 units of prbcs. Hgb improved to 9.1 Plan d/c back to SNF today.   Raymond Gurney 08/21/2016 8:54 AM --  Laboratory CBC    Component Value Date/Time   WBC 8.3 08/21/2016 0215   HGB 9.1 (L) 08/21/2016 0215   HGB 9.7 (L) 09/20/2012 1456   HCT 28.6 (L) 08/21/2016 0215   HCT 31.1 (L) 09/20/2012 1456   PLT 184 08/21/2016 0215   PLT 266 09/20/2012 1456    BMET    Component Value Date/Time   NA 138 08/20/2016 0530   NA 137 09/20/2012 1456   K 4.1 08/20/2016 0530   K 4.1 09/20/2012 1456   CL 104 08/20/2016 0530   CL 100 09/20/2012 1456   CO2 27 08/20/2016 0530   CO2 34 (H) 09/20/2012 1456   GLUCOSE 101 (H) 08/20/2016 0530   GLUCOSE 217 (H) 09/20/2012 1456   BUN 10 08/20/2016 0530   BUN 23 (H) 09/20/2012 1456   CREATININE 0.86 08/20/2016 0530   CREATININE 0.94 09/20/2012 1456   CALCIUM 7.9 (L) 08/20/2016 0530   CALCIUM 9.5 09/20/2012 1456   GFRNONAA >60 08/20/2016 0530   GFRNONAA >60 09/20/2012 1456   GFRAA >60 08/20/2016 0530   GFRAA >60 09/20/2012 1456    COAG Lab Results  Component Value Date   INR 1.08 08/19/2016   No results found for: PTT  Antibiotics Anti-infectives    Start     Dose/Rate Route Frequency Ordered Stop   08/20/16 0000  cefUROXime (ZINACEF) 1.5 g in dextrose 5 % 50 mL IVPB     1.5 g 100 mL/hr over 30 Minutes Intravenous Every 12 hours 08/19/16 1336 08/20/16 1248   08/19/16 1200  cefUROXime (ZINACEF) 1.5 g in dextrose 5 % 50 mL IVPB  Status:  Discontinued     1.5 g 100 mL/hr over 30 Minutes Intravenous To Surgery 08/19/16 1150 08/19/16 1316   08/19/16 0641  cefUROXime (ZINACEF) 1.5 g in dextrose 5 % 50 mL IVPB     1.5 g  100 mL/hr over 30 Minutes Intravenous 30 min pre-op 08/19/16 0641 08/19/16 1154       Maris Berger, PA-C [facility] Office: [phone] Pager: [phone] 08/21/2016 8:54 AM

## 2016-08-21 NOTE — Telephone Encounter (Signed)
-----   Message from Sharee Pimple, RN sent at 08/21/2016  9:35 AM EDT ----- Regarding: 2 weeks   ----- Message ----- From: Raymond Gurney, PA-C Sent: 08/21/2016   9:24 AM To: [facility] Charge Pool  S/p left below knee to peroneal bypass and left great toe amputation 08/19/16.   F/u with Dr. Edilia Bo in 2 weeks.   Thanks Selena Batten

## 2016-08-21 NOTE — Discharge Summary (Signed)
[facility] Discharge Summary  Terry Green 1940-07-23 76 y.o. male  [id number]  Admission Date: 08/19/2016  Discharge Date: 08/21/2016  Physician: Chuck Hint, *  Admission Diagnosis: Atherosclerosis of native arteries of right leg with ulceration of other part of foot [facility]) [I70.235]  HPI:   This is a 76 y.o. male who Dr. Edilia Bo last saw on 07/09/2016. He has had a previous right below the knee amputation. He presented with a wound on the left great toe and underwent an arteriogram which showed severe tibial artery occlusive disease. His only potential target for bypass was the peroneal artery. At the time of his last visit he had some slight drainage from the left great toe with no erythema. He was started him on Levaquin. I instructed the nursing facility to soak the foot daily and lukewarm dial soap soaks. If the wound did not improve the only option would be a popliteal to peroneal artery bypass based on arteriogram that was done 7 months ago.  Since Dr. Edilia Bo saw him last, the wound on the left great toe has worsened. He has a large wound on the left great toe. He denies fever or chills. He has been soaking the foot and has been on po antibiotics.  He denies any history of myocardial infarction or history of congestive heart failure. He denies any chest pain or chest pressure.  Hospital Course:  The patient was admitted to the hospital and taken to the operating room on 08/19/2016 and underwent:   1. LEFT BELOW KNEE POPLITEAL ARTERY TO PERONEAL ARTERY BYPASS WITH VEIN GRAFT 2. INTRAOPERATIVE ARTERIOGRAM 3. AMPUTATION LEFT GREAT TOE  The patient tolerated the procedure well and was transported to the PACU in stable condition.   On POD 1, he had a strong graft signal and strong doppler signals at the anterior tibial and peroneal. He had some hypotension and his hemoglobin had decreased down to 6.7 secondary to acute blood loss anemia. He was  transfused 2 units of prbcs.   His dressing was taken down on POD 2 and his toe amputation site was healing. His hemoglobin had increased appropriately after transfusion and his hypotension had resolved. He was recommended SNF by PT and OT. He was stable for discharge back to SNF on POD 2.   ABI completed on 08/21/16  Unable to obtain right ABI due to below the knee amputation. Left ABI of 0.87 is suggestive of mild arterial occlusive disease at rest. Previous ABI obtained on 06/23/2016 demonstrated a value of 0.7 on the left.   RIGHT    LEFT    PRESSURE WAVEFORM  PRESSURE WAVEFORM  BRACHIAL IV Triphasic BRACHIAL 159 Triphasic  DP BKA  DP 138 Monophasic  PT BKA  PT 137 Monophasic    RIGHT LEFT  ABI  0.87     CBC    Component Value Date/Time   WBC 8.3 08/21/2016 0215   RBC 3.92 (L) 08/21/2016 0215   HGB 9.1 (L) 08/21/2016 0215   HGB 9.7 (L) 09/20/2012 1456   HCT 28.6 (L) 08/21/2016 0215   HCT 31.1 (L) 09/20/2012 1456   PLT 184 08/21/2016 0215   PLT 266 09/20/2012 1456   MCV 73.0 (L) 08/21/2016 0215   MCV 71 (L) 09/20/2012 1456   MCH 23.2 (L) 08/21/2016 0215   MCHC 31.8 08/21/2016 0215   RDW 22.3 (H) 08/21/2016 0215   RDW 19.7 (H) 09/20/2012 1456    BMET    Component Value Date/Time  NA 138 08/20/2016 0530   NA 137 09/20/2012 1456   K 4.1 08/20/2016 0530   K 4.1 09/20/2012 1456   CL 104 08/20/2016 0530   CL 100 09/20/2012 1456   CO2 27 08/20/2016 0530   CO2 34 (H) 09/20/2012 1456   GLUCOSE 101 (H) 08/20/2016 0530   GLUCOSE 217 (H) 09/20/2012 1456   BUN 10 08/20/2016 0530   BUN 23 (H) 09/20/2012 1456   CREATININE 0.86 08/20/2016 0530   CREATININE 0.94 09/20/2012 1456   CALCIUM 7.9 (L) 08/20/2016 0530   CALCIUM 9.5 09/20/2012 1456   GFRNONAA >60 08/20/2016 0530   GFRNONAA >60 09/20/2012 1456   GFRAA >60 08/20/2016 0530   GFRAA >60 09/20/2012 1456     Discharge Instructions:   The patient is discharged to SNF with extensive instructions on  wound care and progressive ambulation.  They are instructed not to drive or perform any heavy lifting until returning to see the physician in his office.  Discharge Instructions    Call MD for:  redness, tenderness, or signs of infection (pain, swelling, bleeding, redness, odor or green/yellow discharge around incision site)    Complete by:  As directed    Call MD for:  severe or increased pain, loss or decreased feeling  in affected limb(s)    Complete by:  As directed    Call MD for:  temperature >100.5    Complete by:  As directed    Discharge wound care:    Complete by:  As directed    Wash leg and toe wounds daily with soap and water and pat dry. Do not apply any creams or ointments on your incisions.  Toe amputation site: keep dry gauze to suture line and wrap with kerlix and ACE daily.   Increase activity slowly    Complete by:  As directed    Walk with assistance use walker. Use post-op shoe with weighbearing heel only on left.   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 Protein-calorie malnutrition, severe (HCC) 06/08/2014  . PAD (peripheral artery disease) (HCC) 06/06/2014   Past Medical History:  Diagnosis Date  . Abnormality of gait and mobility   . Anemia    iron deficiency  . Anxiety   . Arthritis    "hands" (06/06/2014)  . Bipolar disorder (HCC)   . BPH (benign prostatic hyperplasia)   . Coronary artery disease   . Dementia   . Depression   . 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 Generalized weakness   . Hypercholesteremia   . Hypertension   . PAD (peripheral artery disease) (HCC)   . Peripheral vascular disease (HCC)   .  Psychosis   . Rheumatoid arthritis (HCC)   . Type II diabetes mellitus (HCC)    Type 2     Allergies as of 08/21/2016      Reactions   Lisinopril Swelling   SWELLING REACTION UNSPECIFIED       Medication List    TAKE these medications   acetaminophen 500 MG tablet Commonly known as:  TYLENOL Take 1,000 mg by mouth every 8 (eight) hours as needed for moderate pain.  amLODipine 5 MG tablet Commonly known as:  NORVASC Take 5 mg by mouth daily.   aspirin EC 81 MG tablet Take 81 mg by mouth daily.   atorvastatin 10 MG tablet Commonly known as:  LIPITOR Take 1 tablet (10 mg total) by mouth daily.   busPIRone 15 MG tablet Commonly known as:  BUSPAR Take 15 mg by mouth every evening.   carvedilol 12.5 MG tablet Commonly known as:  COREG Take 12.5 mg by mouth 2 (two) times daily with a meal.   CHLORASEPTIC SORE THROAT MT Use as directed 1 spray in the mouth or throat every 2 (two) hours as needed (sore throat).   clopidogrel 75 MG tablet Commonly known as:  PLAVIX Take 75 mg by mouth daily.   divalproex 500 MG 24 hr tablet Commonly known as:  DEPAKOTE ER Take 500 mg by mouth 2 (two) times daily.   gabapentin 300 MG capsule Commonly known as:  NEURONTIN Take 300 mg by mouth 3 (three) times daily.   guaiFENesin-dextromethorphan 100-10 MG/5ML syrup Commonly known as:  ROBITUSSIN DM Take 10 mLs by mouth every 4 (four) hours as needed for cough.   HYDROcodone-acetaminophen 7.5-325 MG tablet Commonly known as:  NORCO Take 1-2 tablets by mouth every 4 (four) hours as needed for moderate pain. What changed:  how much to take   insulin aspart 100 UNIT/ML FlexPen Commonly known as:  NOVOLOG Inject 0-20 Units into the skin 2 (two) times daily. 6:30am and 4pm per sliding scale:  CBG 150-249 3 units, 250-349 5 units, 350-449 8 units, 450-549 14 units, 550-551 20 units >551 call MD   Insulin Glargine 100 UNIT/ML Solostar Pen Commonly known as:  LANTUS Inject 15 Units into  the skin at bedtime.   loratadine 10 MG tablet Commonly known as:  CLARITIN Take 10 mg by mouth daily.   memantine 10 MG tablet Commonly known as:  NAMENDA Take 10 mg by mouth 2 (two) times daily.   metFORMIN 1000 MG tablet Commonly known as:  GLUCOPHAGE Take 1,000 mg by mouth 2 (two) times daily with a meal.   multivitamin with minerals tablet Take 1 tablet by mouth daily.   MYLANTA 200-200-20 MG/5ML suspension Generic drug:  alum & mag hydroxide-simeth Take 15 mLs by mouth every 6 (six) hours as needed for indigestion or heartburn.   oxybutynin 10 MG 24 hr tablet Commonly known as:  DITROPAN-XL Take 10 mg by mouth at bedtime.   risperiDONE 0.5 MG tablet Commonly known as:  RISPERDAL Take 0.5 mg by mouth at bedtime.   sertraline 100 MG tablet Commonly known as:  ZOLOFT Take 100 mg by mouth daily.   tamsulosin 0.4 MG Caps capsule Commonly known as:  FLOMAX Take 0.4 mg by mouth at bedtime.   vancomycin 1,000 mg in sodium chloride 0.9 % 250 mL Inject 1,000 mg into the vein every 12 (twelve) hours. For infection to left foot. Start date: 08/10/16   vitamin C 500 MG tablet Commonly known as:  ASCORBIC ACID Take 500 mg by mouth 2 (two) times daily.       Hydrocodone #30 No Refill  Disposition: Home  Patient's condition: is Good  Follow up: 1. Dr. Edilia Bo in 2 weeks   Maris Berger, PA-C [facility] [phone] 08/21/2016  9:22 AM  - For VQI Registry use --- Instructions: Press F2 to tab through selections.  Delete question if not applicable.   Post-op:  Wound infection: No  Graft infection: No  Transfusion:  Yes  If yes, 2 units given New Arrhythmia: No Ipsilateral amputation: No, [ ]  Minor, [ ]  BKA, [ ]  AKA Discharge patency: [ x] Primary, [ ]  Primary assisted, [ ]  Secondary, [ ]  Occluded Patency judged by: [ x] Dopper only, [ ]  Palpable graft pulse, [ ]  Palpable distal pulse, [ ]  ABI inc. > 0.15, [ ]  Duplex Discharge ABI: , L  0.8 D/C Ambulatory Status: Ambulatory with Assistance  Complications: MI: No, [ ]  Troponin only, [ ]  EKG or Clinical CHF: No Resp failure:No, [ ]  Pneumonia, [ ]  Ventilator Chg in renal function: No, [ ]  Inc. Cr > 0.5, [ ]  Temp. Dialysis, [ ]  Permanent dialysis Stroke: No, [ ]  Minor, [ ]  Major Return to OR: No  Reason for return to OR: [ ]  Bleeding, [ ]  Infection, [ ]  Thrombosis, [ ]  Revision  Discharge medications: Statin use:  yes ASA use:  yes Plavix use:  yes Beta blocker use: yes Coumadin use: no  I have interviewed the patient and examined the patient. I agree with the findings by the PA.  Cari Caraway, MD [phone]

## 2016-08-21 NOTE — Telephone Encounter (Signed)
Sched appt 09/10/16 at 1:45. Spoke to Byron at the [facility] and pt's daughter to confirm appt.

## 2016-08-22 DIAGNOSIS — F0390 Unspecified dementia without behavioral disturbance: Secondary | ICD-10-CM | POA: Diagnosis not present

## 2016-08-22 DIAGNOSIS — I70262 Atherosclerosis of native arteries of extremities with gangrene, left leg: Secondary | ICD-10-CM | POA: Diagnosis not present

## 2016-08-22 DIAGNOSIS — E1142 Type 2 diabetes mellitus with diabetic polyneuropathy: Secondary | ICD-10-CM | POA: Diagnosis not present

## 2016-08-22 DIAGNOSIS — Z79899 Other long term (current) drug therapy: Secondary | ICD-10-CM | POA: Diagnosis not present

## 2016-08-22 DIAGNOSIS — Z5181 Encounter for therapeutic drug level monitoring: Secondary | ICD-10-CM | POA: Diagnosis not present

## 2016-08-22 DIAGNOSIS — I10 Essential (primary) hypertension: Secondary | ICD-10-CM | POA: Diagnosis not present

## 2016-08-25 DIAGNOSIS — Z89412 Acquired absence of left great toe: Secondary | ICD-10-CM | POA: Diagnosis not present

## 2016-08-26 DIAGNOSIS — R262 Difficulty in walking, not elsewhere classified: Secondary | ICD-10-CM | POA: Diagnosis not present

## 2016-08-26 DIAGNOSIS — R279 Unspecified lack of coordination: Secondary | ICD-10-CM | POA: Diagnosis not present

## 2016-08-26 DIAGNOSIS — Z89511 Acquired absence of right leg below knee: Secondary | ICD-10-CM | POA: Diagnosis not present

## 2016-08-26 DIAGNOSIS — M24561 Contracture, right knee: Secondary | ICD-10-CM | POA: Diagnosis not present

## 2016-08-27 ENCOUNTER — Telehealth: Payer: Self-pay | Admitting: *Deleted

## 2016-08-27 NOTE — Telephone Encounter (Signed)
-----   Message from Chuck Hint, MD sent at 08/26/2016  1:01 PM EDT ----- Regarding: RE: Question 2 weeks so it can be stopped now. Thanks Thayer Ohm  ----- Message ----- From: Sharee Pimple, RN Sent: 08/25/2016   2:17 PM To: Chuck Hint, MD Subject: Question                                       This patient was d/c's to [facility] with order for vancomycin 1,000 mg in sodium chloride 0.9 % 250 mL Inject 1,000 mg into the vein every 12 (twelve) hours. For infection to left foot. Start date: 08/10/16  Per Kim's order.   [facility] wants to know how long do you want them to do Vancomycin?  You see him on 09-10-16

## 2016-08-27 NOTE — Telephone Encounter (Signed)
I called [facility] and spoke to [facility] regarding discontinuation of Vancomycin as per order from Dr. Edilia Bo.

## 2016-08-28 DIAGNOSIS — Z5181 Encounter for therapeutic drug level monitoring: Secondary | ICD-10-CM | POA: Diagnosis not present

## 2016-08-28 DIAGNOSIS — Z79899 Other long term (current) drug therapy: Secondary | ICD-10-CM | POA: Diagnosis not present

## 2016-08-29 ENCOUNTER — Encounter: Payer: Self-pay | Admitting: Vascular Surgery

## 2016-09-01 DIAGNOSIS — Z5181 Encounter for therapeutic drug level monitoring: Secondary | ICD-10-CM | POA: Diagnosis not present

## 2016-09-01 DIAGNOSIS — Z792 Long term (current) use of antibiotics: Secondary | ICD-10-CM | POA: Diagnosis not present

## 2016-09-09 DIAGNOSIS — E119 Type 2 diabetes mellitus without complications: Secondary | ICD-10-CM | POA: Diagnosis not present

## 2016-09-09 DIAGNOSIS — E782 Mixed hyperlipidemia: Secondary | ICD-10-CM | POA: Diagnosis not present

## 2016-09-09 DIAGNOSIS — Z79899 Other long term (current) drug therapy: Secondary | ICD-10-CM | POA: Diagnosis not present

## 2016-09-09 DIAGNOSIS — D518 Other vitamin B12 deficiency anemias: Secondary | ICD-10-CM | POA: Diagnosis not present

## 2016-09-10 ENCOUNTER — Ambulatory Visit (INDEPENDENT_AMBULATORY_CARE_PROVIDER_SITE_OTHER): Payer: Self-pay | Admitting: Vascular Surgery

## 2016-09-10 ENCOUNTER — Encounter: Payer: Self-pay | Admitting: Vascular Surgery

## 2016-09-10 VITALS — BP 115/65 | HR 58 | Temp 98.1°F | Resp 20 | Ht 73.0 in | Wt 175.0 lb

## 2016-09-10 DIAGNOSIS — I70245 Atherosclerosis of native arteries of left leg with ulceration of other part of foot: Secondary | ICD-10-CM

## 2016-09-10 NOTE — Progress Notes (Signed)
Patient name: Terry Green MRN: [id number] DOB: 07-Jul-1940 Sex: male  REASON FOR VISIT: post-op  HPI: Terry Green is a 76 y.o. male who presents for his first postoperative follow-up status post left below knee popliteal to peroneal artery bypass with vein graft and left great toe amputation on 08/19/2016 by Dr. Edilia Bo. This was done for a nonhealing wound of the left great toe. 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  The patient currently resides in a skilled nursing facility. He has been ambulating with a Darco shoe on the left and a prosthetic leg on the right. He denies any pain. He has been having minimal clear drainage from his left great toe amputation site. He has not been elevating his legs.  Current Outpatient Prescriptions  Medication Sig Dispense Refill  . amLODipine (NORVASC) 5 MG tablet Take 5 mg by mouth daily.    Marland Kitchen aspirin EC 81 MG tablet Take 81 mg by mouth daily.    Marland Kitchen atorvastatin (LIPITOR) 10 MG tablet Take 1 tablet (10 mg total) by mouth daily. 30 tablet 11  . busPIRone (BUSPAR) 15 MG tablet Take 15 mg by mouth every evening.    . carvedilol (COREG) 12.5 MG tablet Take 12.5 mg by mouth 2 (two) times daily with a meal.    . clopidogrel (PLAVIX) 75 MG tablet Take 75 mg by mouth daily.    . divalproex (DEPAKOTE ER) 500 MG 24 hr tablet Take 500 mg by mouth 2 (two) times daily.     . Insulin Glargine (LANTUS) 100 UNIT/ML Solostar Pen Inject 15 Units into the skin at bedtime.     . Insulin Isophane & Regular Human (HUMULIN 70/30 KWIKPEN) (70-30) 100 UNIT/ML PEN Inject into the skin.    Marland Kitchen loratadine (CLARITIN) 10 MG tablet Take 10 mg by mouth daily.    . memantine (NAMENDA) 10 MG tablet Take 10 mg by mouth 2 (two) times daily.    . metFORMIN (GLUCOPHAGE) 1000 MG tablet Take 1,000 mg by mouth 2 (two) times daily with a meal.     . Multiple Vitamins-Minerals (MULTIVITAMIN WITH MINERALS) tablet Take 1 tablet by mouth daily.    Marland Kitchen oxybutynin  (DITROPAN-XL) 10 MG 24 hr tablet Take 10 mg by mouth at bedtime.    . risperiDONE (RISPERDAL) 0.5 MG tablet Take 0.5 mg by mouth at bedtime.    . sertraline (ZOLOFT) 100 MG tablet Take 100 mg by mouth daily.    . tamsulosin (FLOMAX) 0.4 MG CAPS capsule Take 0.4 mg by mouth at bedtime.     . vitamin C (ASCORBIC ACID) 500 MG tablet Take 500 mg by mouth 2 (two) times daily.    Marland Kitchen acetaminophen (TYLENOL) 500 MG tablet Take 1,000 mg by mouth every 8 (eight) hours as needed for moderate pain.    Marland Kitchen alum & mag hydroxide-simeth (MYLANTA) 200-200-20 MG/5ML suspension Take 15 mLs by mouth every 6 (six) hours as needed for indigestion or heartburn.    . Benzocaine-Menthol (CHLORASEPTIC SORE THROAT MT) Use as directed 1 spray in the mouth or throat every 2 (two) hours as needed (sore throat).     . gabapentin (NEURONTIN) 300 MG capsule Take 300 mg by mouth 3 (three) times daily.    Marland Kitchen guaiFENesin-dextromethorphan (ROBITUSSIN DM) 100-10 MG/5ML syrup Take 10 mLs by mouth every 4 (four) hours as needed for cough.    Marland Kitchen HYDROcodone-acetaminophen (NORCO) 7.5-325 MG tablet Take 1-2 tablets by mouth every 4 (four) hours as needed for moderate  pain. (Patient not taking: Reported on 09/10/2016) 30 tablet 0  . insulin aspart (NOVOLOG) 100 UNIT/ML FlexPen Inject 0-20 Units into the skin 2 (two) times daily. 6:30am and 4pm per sliding scale:  CBG 150-249 3 units, 250-349 5 units, 350-449 8 units, 450-549 14 units, 550-551 20 units >551 call MD    . Sodium Chloride Flush (NORMAL SALINE FLUSH IV) Inject into the vein.    . vancomycin 1,000 mg in sodium chloride 0.9 % 250 mL Inject 1,000 mg into the vein every 12 (twelve) hours. For infection to left foot. Start date: 08/10/16     No current facility-administered medications for this visit.     REVIEW OF SYSTEMS:  [X]  denotes positive finding, [ ]  denotes negative finding Cardiac  Comments:  Chest pain or chest pressure:    Shortness of breath upon exertion:    Short of  breath when lying flat:    Irregular heart rhythm:    Constitutional    Fever or chills:      PHYSICAL EXAM: Vitals:   09/10/16 1320  BP: 115/65  Pulse: (!) 58  Resp: 20  Temp: 98.1 F (36.7 C)  TempSrc: Oral  SpO2: 98%  Weight: 175 lb (79.4 kg)  Height: 6\' 1"  (1.854 m)    GENERAL: The patient is a well-nourished male, in no acute distress. The vital signs are documented above. PULMONARY: Non labored respiratory effort.  VASCULAR: Left leg incisions intact. Moderate swelling left leg with some skin peeling around left medial calf. Left great toe amputation site with fibrinous exudate at incision and mild swelling. Monophasic left peroneal and DP signals.  MEDICAL ISSUES: Status post left below knee popliteal to peroneal artery bypass with vein graft and left great toe amputation  Bypass graft is patent. Leg incisions are healing well. He does have some moderate swelling in his left leg and foot. Left great toe amputation site with swelling. Sutures are not ready to be removed yet. Educated the patient on leg elevation to minimize swelling. Also orders written for daily lukewarm dial soap soaks and dry dressing placed afterwards. Continue ambulation with darco shoe and right leg prosthesis. He will follow up in two weeks to evaluate toe amputation incision.   , PA-C [facility] MD: 

## 2016-09-15 ENCOUNTER — Encounter: Payer: Self-pay | Admitting: Vascular Surgery

## 2016-09-23 DIAGNOSIS — R262 Difficulty in walking, not elsewhere classified: Secondary | ICD-10-CM | POA: Diagnosis not present

## 2016-09-23 DIAGNOSIS — I10 Essential (primary) hypertension: Secondary | ICD-10-CM | POA: Diagnosis not present

## 2016-09-23 DIAGNOSIS — M24561 Contracture, right knee: Secondary | ICD-10-CM | POA: Diagnosis not present

## 2016-09-23 DIAGNOSIS — R279 Unspecified lack of coordination: Secondary | ICD-10-CM | POA: Diagnosis not present

## 2016-09-24 ENCOUNTER — Ambulatory Visit (INDEPENDENT_AMBULATORY_CARE_PROVIDER_SITE_OTHER): Payer: Self-pay | Admitting: Vascular Surgery

## 2016-09-24 ENCOUNTER — Encounter: Payer: Self-pay | Admitting: Vascular Surgery

## 2016-09-24 VITALS — BP 158/78 | HR 56 | Temp 97.5°F | Resp 16 | Ht 73.0 in | Wt 175.0 lb

## 2016-09-24 DIAGNOSIS — I739 Peripheral vascular disease, unspecified: Secondary | ICD-10-CM

## 2016-09-24 DIAGNOSIS — I70245 Atherosclerosis of native arteries of left leg with ulceration of other part of foot: Secondary | ICD-10-CM

## 2016-09-24 NOTE — Progress Notes (Signed)
Patient name: Terry Green MRN: [id number] DOB: 04-26-1940 Sex: male  REASON FOR VISIT:    Follow up after left lower extremity bypass.  HPI:   TERRELL Green is a pleasant 76 y.o. male who underwent a left below knee popliteal to peroneal artery bypass with a vein graft and left great toe amputation on 08/19/2016. He was last seen in our office on 09/10/2016. His bypass graft was patent. His leg incisions were healing well. He gets a moderate swelling of the left leg and foot and was encouraged to elevate his leg. He was to come back in 2 weeks to evaluate the toe amputation site.  He has no specific complaints. He states that his swelling is much better as he has been elevating his leg.  Current Outpatient Prescriptions  Medication Sig Dispense Refill  . acetaminophen (TYLENOL) 500 MG tablet Take 1,000 mg by mouth every 8 (eight) hours as needed for moderate pain.    Marland Kitchen alum & mag hydroxide-simeth (MYLANTA) 200-200-20 MG/5ML suspension Take 15 mLs by mouth every 6 (six) hours as needed for indigestion or heartburn.    Marland Kitchen amLODipine (NORVASC) 5 MG tablet Take 5 mg by mouth daily.    Marland Kitchen aspirin EC 81 MG tablet Take 81 mg by mouth daily.    Marland Kitchen atorvastatin (LIPITOR) 10 MG tablet Take 1 tablet (10 mg total) by mouth daily. 30 tablet 11  . Benzocaine-Menthol (CHLORASEPTIC SORE THROAT MT) Use as directed 1 spray in the mouth or throat every 2 (two) hours as needed (sore throat).     . busPIRone (BUSPAR) 15 MG tablet Take 15 mg by mouth every evening.    . carvedilol (COREG) 12.5 MG tablet Take 12.5 mg by mouth 2 (two) times daily with a meal.    . clopidogrel (PLAVIX) 75 MG tablet Take 75 mg by mouth daily.    . divalproex (DEPAKOTE ER) 500 MG 24 hr tablet Take 500 mg by mouth 2 (two) times daily.     Marland Kitchen gabapentin (NEURONTIN) 300 MG capsule Take 300 mg by mouth 3 (three) times daily.    Marland Kitchen guaiFENesin-dextromethorphan (ROBITUSSIN DM) 100-10 MG/5ML syrup Take 10 mLs by mouth every 4 (four) hours  as needed for cough.    Marland Kitchen HYDROcodone-acetaminophen (NORCO) 7.5-325 MG tablet Take 1-2 tablets by mouth every 4 (four) hours as needed for moderate pain. 30 tablet 0  . insulin aspart (NOVOLOG) 100 UNIT/ML FlexPen Inject 0-20 Units into the skin 2 (two) times daily. 6:30am and 4pm per sliding scale:  CBG 150-249 3 units, 250-349 5 units, 350-449 8 units, 450-549 14 units, 550-551 20 units >551 call MD    . Insulin Glargine (LANTUS) 100 UNIT/ML Solostar Pen Inject 15 Units into the skin at bedtime.     . Insulin Isophane & Regular Human (HUMULIN 70/30 KWIKPEN) (70-30) 100 UNIT/ML PEN Inject into the skin.    Marland Kitchen loratadine (CLARITIN) 10 MG tablet Take 10 mg by mouth daily.    . memantine (NAMENDA) 10 MG tablet Take 10 mg by mouth 2 (two) times daily.    . metFORMIN (GLUCOPHAGE) 1000 MG tablet Take 1,000 mg by mouth 2 (two) times daily with a meal.     . Multiple Vitamins-Minerals (MULTIVITAMIN WITH MINERALS) tablet Take 1 tablet by mouth daily.    Marland Kitchen oxybutynin (DITROPAN-XL) 10 MG 24 hr tablet Take 10 mg by mouth at bedtime.    . risperiDONE (RISPERDAL) 0.5 MG tablet Take 0.5 mg by mouth at bedtime.    Marland Kitchen  sertraline (ZOLOFT) 100 MG tablet Take 100 mg by mouth daily.    . Sodium Chloride Flush (NORMAL SALINE FLUSH IV) Inject into the vein.    . tamsulosin (FLOMAX) 0.4 MG CAPS capsule Take 0.4 mg by mouth at bedtime.     . vancomycin 1,000 mg in sodium chloride 0.9 % 250 mL Inject 1,000 mg into the vein every 12 (twelve) hours. For infection to left foot. Start date: 08/10/16    . vitamin C (ASCORBIC ACID) 500 MG tablet Take 500 mg by mouth 2 (two) times daily.     No current facility-administered medications for this visit.     REVIEW OF SYSTEMS:  [X]  denotes positive finding, [ ]  denotes negative finding Cardiac  Comments:  Chest pain or chest pressure:    Shortness of breath upon exertion:    Short of breath when lying flat:    Irregular heart rhythm:    Constitutional    Fever or chills:      PHYSICAL EXAM:   Vitals:   09/24/16 1043  BP: (!) 158/78  Pulse: (!) 56  Resp: 16  Temp: (!) 97.5 F (36.4 C)  TempSrc: Oral  SpO2: 100%  Weight: 175 lb (79.4 kg)  Height: 6\' 1"  (1.854 m)    GENERAL: The patient is a well-nourished male, in no acute distress. The vital signs are documented above. CARDIOVASCULAR: There is a regular rate and rhythm. PULMONARY: There is good air exchange bilaterally without wheezing or rales. He has a brisk peroneal signal with the Doppler and a brisk graft signal with the Doppler. His toe indications height was healing nicely removed his sutures today.  DATA:   No new data.  MEDICAL ISSUES:   STATUS POST LEFT POPLITEAL TO PERONEAL ARTERY BYPASS WITH VEIN: His bypass graft is working well. I removed his sutures from the toe amputation site today. I've instructed the facility to continue to soak the foot daily and lukewarm dials soap soaks. I'll plan on seeing him back in October for a graft scan and ABIs and less than or any issues with his toe amputation site.  11/25/16 [facility] [place] [phone]

## 2016-09-25 DIAGNOSIS — R2689 Other abnormalities of gait and mobility: Secondary | ICD-10-CM | POA: Diagnosis not present

## 2016-09-25 DIAGNOSIS — R278 Other lack of coordination: Secondary | ICD-10-CM | POA: Diagnosis not present

## 2016-09-25 DIAGNOSIS — M79675 Pain in left toe(s): Secondary | ICD-10-CM | POA: Diagnosis not present

## 2016-09-25 DIAGNOSIS — I739 Peripheral vascular disease, unspecified: Secondary | ICD-10-CM | POA: Diagnosis not present

## 2016-09-25 DIAGNOSIS — E1121 Type 2 diabetes mellitus with diabetic nephropathy: Secondary | ICD-10-CM | POA: Diagnosis not present

## 2016-09-29 DIAGNOSIS — I739 Peripheral vascular disease, unspecified: Secondary | ICD-10-CM | POA: Diagnosis not present

## 2016-09-29 DIAGNOSIS — M79675 Pain in left toe(s): Secondary | ICD-10-CM | POA: Diagnosis not present

## 2016-09-29 DIAGNOSIS — R278 Other lack of coordination: Secondary | ICD-10-CM | POA: Diagnosis not present

## 2016-09-29 DIAGNOSIS — T8131XA Disruption of external operation (surgical) wound, not elsewhere classified, initial encounter: Secondary | ICD-10-CM | POA: Diagnosis not present

## 2016-09-29 DIAGNOSIS — R2689 Other abnormalities of gait and mobility: Secondary | ICD-10-CM | POA: Diagnosis not present

## 2016-09-29 DIAGNOSIS — E1121 Type 2 diabetes mellitus with diabetic nephropathy: Secondary | ICD-10-CM | POA: Diagnosis not present

## 2016-09-30 DIAGNOSIS — I739 Peripheral vascular disease, unspecified: Secondary | ICD-10-CM | POA: Diagnosis not present

## 2016-09-30 DIAGNOSIS — R278 Other lack of coordination: Secondary | ICD-10-CM | POA: Diagnosis not present

## 2016-09-30 DIAGNOSIS — R2689 Other abnormalities of gait and mobility: Secondary | ICD-10-CM | POA: Diagnosis not present

## 2016-09-30 DIAGNOSIS — E1121 Type 2 diabetes mellitus with diabetic nephropathy: Secondary | ICD-10-CM | POA: Diagnosis not present

## 2016-09-30 DIAGNOSIS — M79675 Pain in left toe(s): Secondary | ICD-10-CM | POA: Diagnosis not present

## 2016-10-01 DIAGNOSIS — I739 Peripheral vascular disease, unspecified: Secondary | ICD-10-CM | POA: Diagnosis not present

## 2016-10-01 DIAGNOSIS — E1121 Type 2 diabetes mellitus with diabetic nephropathy: Secondary | ICD-10-CM | POA: Diagnosis not present

## 2016-10-01 DIAGNOSIS — R2689 Other abnormalities of gait and mobility: Secondary | ICD-10-CM | POA: Diagnosis not present

## 2016-10-01 DIAGNOSIS — R278 Other lack of coordination: Secondary | ICD-10-CM | POA: Diagnosis not present

## 2016-10-01 DIAGNOSIS — M79675 Pain in left toe(s): Secondary | ICD-10-CM | POA: Diagnosis not present

## 2016-10-02 DIAGNOSIS — E1121 Type 2 diabetes mellitus with diabetic nephropathy: Secondary | ICD-10-CM | POA: Diagnosis not present

## 2016-10-02 DIAGNOSIS — R278 Other lack of coordination: Secondary | ICD-10-CM | POA: Diagnosis not present

## 2016-10-02 DIAGNOSIS — R2689 Other abnormalities of gait and mobility: Secondary | ICD-10-CM | POA: Diagnosis not present

## 2016-10-02 DIAGNOSIS — I739 Peripheral vascular disease, unspecified: Secondary | ICD-10-CM | POA: Diagnosis not present

## 2016-10-02 DIAGNOSIS — M79675 Pain in left toe(s): Secondary | ICD-10-CM | POA: Diagnosis not present

## 2016-10-03 DIAGNOSIS — M79675 Pain in left toe(s): Secondary | ICD-10-CM | POA: Diagnosis not present

## 2016-10-03 DIAGNOSIS — E1121 Type 2 diabetes mellitus with diabetic nephropathy: Secondary | ICD-10-CM | POA: Diagnosis not present

## 2016-10-03 DIAGNOSIS — I739 Peripheral vascular disease, unspecified: Secondary | ICD-10-CM | POA: Diagnosis not present

## 2016-10-03 DIAGNOSIS — R2689 Other abnormalities of gait and mobility: Secondary | ICD-10-CM | POA: Diagnosis not present

## 2016-10-03 DIAGNOSIS — R278 Other lack of coordination: Secondary | ICD-10-CM | POA: Diagnosis not present

## 2016-10-05 DIAGNOSIS — R2689 Other abnormalities of gait and mobility: Secondary | ICD-10-CM | POA: Diagnosis not present

## 2016-10-05 DIAGNOSIS — M79675 Pain in left toe(s): Secondary | ICD-10-CM | POA: Diagnosis not present

## 2016-10-05 DIAGNOSIS — R278 Other lack of coordination: Secondary | ICD-10-CM | POA: Diagnosis not present

## 2016-10-05 DIAGNOSIS — I739 Peripheral vascular disease, unspecified: Secondary | ICD-10-CM | POA: Diagnosis not present

## 2016-10-05 DIAGNOSIS — E1121 Type 2 diabetes mellitus with diabetic nephropathy: Secondary | ICD-10-CM | POA: Diagnosis not present

## 2016-10-06 DIAGNOSIS — R278 Other lack of coordination: Secondary | ICD-10-CM | POA: Diagnosis not present

## 2016-10-06 DIAGNOSIS — R2689 Other abnormalities of gait and mobility: Secondary | ICD-10-CM | POA: Diagnosis not present

## 2016-10-06 DIAGNOSIS — M79675 Pain in left toe(s): Secondary | ICD-10-CM | POA: Diagnosis not present

## 2016-10-06 DIAGNOSIS — E1121 Type 2 diabetes mellitus with diabetic nephropathy: Secondary | ICD-10-CM | POA: Diagnosis not present

## 2016-10-06 DIAGNOSIS — I739 Peripheral vascular disease, unspecified: Secondary | ICD-10-CM | POA: Diagnosis not present

## 2016-10-07 DIAGNOSIS — R2689 Other abnormalities of gait and mobility: Secondary | ICD-10-CM | POA: Diagnosis not present

## 2016-10-07 DIAGNOSIS — I739 Peripheral vascular disease, unspecified: Secondary | ICD-10-CM | POA: Diagnosis not present

## 2016-10-07 DIAGNOSIS — E1121 Type 2 diabetes mellitus with diabetic nephropathy: Secondary | ICD-10-CM | POA: Diagnosis not present

## 2016-10-07 DIAGNOSIS — M79675 Pain in left toe(s): Secondary | ICD-10-CM | POA: Diagnosis not present

## 2016-10-07 DIAGNOSIS — R278 Other lack of coordination: Secondary | ICD-10-CM | POA: Diagnosis not present

## 2016-10-08 DIAGNOSIS — M79675 Pain in left toe(s): Secondary | ICD-10-CM | POA: Diagnosis not present

## 2016-10-08 DIAGNOSIS — R278 Other lack of coordination: Secondary | ICD-10-CM | POA: Diagnosis not present

## 2016-10-08 DIAGNOSIS — E1121 Type 2 diabetes mellitus with diabetic nephropathy: Secondary | ICD-10-CM | POA: Diagnosis not present

## 2016-10-08 DIAGNOSIS — I739 Peripheral vascular disease, unspecified: Secondary | ICD-10-CM | POA: Diagnosis not present

## 2016-10-08 DIAGNOSIS — R2689 Other abnormalities of gait and mobility: Secondary | ICD-10-CM | POA: Diagnosis not present

## 2016-10-09 DIAGNOSIS — E1121 Type 2 diabetes mellitus with diabetic nephropathy: Secondary | ICD-10-CM | POA: Diagnosis not present

## 2016-10-09 DIAGNOSIS — M79675 Pain in left toe(s): Secondary | ICD-10-CM | POA: Diagnosis not present

## 2016-10-09 DIAGNOSIS — R2689 Other abnormalities of gait and mobility: Secondary | ICD-10-CM | POA: Diagnosis not present

## 2016-10-09 DIAGNOSIS — I739 Peripheral vascular disease, unspecified: Secondary | ICD-10-CM | POA: Diagnosis not present

## 2016-10-09 DIAGNOSIS — R278 Other lack of coordination: Secondary | ICD-10-CM | POA: Diagnosis not present

## 2016-10-10 NOTE — Addendum Note (Signed)
Addended by: Burton Apley A on: 10/10/2016 04:15 PM   Modules accepted: Orders

## 2016-10-13 DIAGNOSIS — R278 Other lack of coordination: Secondary | ICD-10-CM | POA: Diagnosis not present

## 2016-10-13 DIAGNOSIS — I739 Peripheral vascular disease, unspecified: Secondary | ICD-10-CM | POA: Diagnosis not present

## 2016-10-13 DIAGNOSIS — R2689 Other abnormalities of gait and mobility: Secondary | ICD-10-CM | POA: Diagnosis not present

## 2016-10-13 DIAGNOSIS — E1121 Type 2 diabetes mellitus with diabetic nephropathy: Secondary | ICD-10-CM | POA: Diagnosis not present

## 2016-10-13 DIAGNOSIS — M79675 Pain in left toe(s): Secondary | ICD-10-CM | POA: Diagnosis not present

## 2016-10-14 DIAGNOSIS — Z79899 Other long term (current) drug therapy: Secondary | ICD-10-CM | POA: Diagnosis not present

## 2016-10-14 DIAGNOSIS — R2689 Other abnormalities of gait and mobility: Secondary | ICD-10-CM | POA: Diagnosis not present

## 2016-10-14 DIAGNOSIS — I739 Peripheral vascular disease, unspecified: Secondary | ICD-10-CM | POA: Diagnosis not present

## 2016-10-14 DIAGNOSIS — R278 Other lack of coordination: Secondary | ICD-10-CM | POA: Diagnosis not present

## 2016-10-14 DIAGNOSIS — E1121 Type 2 diabetes mellitus with diabetic nephropathy: Secondary | ICD-10-CM | POA: Diagnosis not present

## 2016-10-14 DIAGNOSIS — M79675 Pain in left toe(s): Secondary | ICD-10-CM | POA: Diagnosis not present

## 2016-10-27 DIAGNOSIS — H26493 Other secondary cataract, bilateral: Secondary | ICD-10-CM | POA: Diagnosis not present

## 2016-10-27 DIAGNOSIS — E119 Type 2 diabetes mellitus without complications: Secondary | ICD-10-CM | POA: Diagnosis not present

## 2016-10-27 DIAGNOSIS — Z961 Presence of intraocular lens: Secondary | ICD-10-CM | POA: Diagnosis not present

## 2016-10-27 DIAGNOSIS — Z794 Long term (current) use of insulin: Secondary | ICD-10-CM | POA: Diagnosis not present

## 2016-11-05 ENCOUNTER — Ambulatory Visit: Payer: Medicare Other | Admitting: Vascular Surgery

## 2016-11-05 ENCOUNTER — Encounter (HOSPITAL_COMMUNITY): Payer: Medicare Other

## 2016-11-29 DIAGNOSIS — M25552 Pain in left hip: Secondary | ICD-10-CM | POA: Diagnosis not present

## 2016-11-29 DIAGNOSIS — M79652 Pain in left thigh: Secondary | ICD-10-CM | POA: Diagnosis not present

## 2016-11-29 DIAGNOSIS — R279 Unspecified lack of coordination: Secondary | ICD-10-CM | POA: Diagnosis not present

## 2016-11-29 DIAGNOSIS — Z7401 Bed confinement status: Secondary | ICD-10-CM | POA: Diagnosis not present

## 2016-12-15 DIAGNOSIS — R262 Difficulty in walking, not elsewhere classified: Secondary | ICD-10-CM | POA: Diagnosis not present

## 2016-12-15 DIAGNOSIS — I10 Essential (primary) hypertension: Secondary | ICD-10-CM | POA: Diagnosis not present

## 2016-12-15 DIAGNOSIS — I739 Peripheral vascular disease, unspecified: Secondary | ICD-10-CM | POA: Diagnosis not present

## 2016-12-15 DIAGNOSIS — F339 Major depressive disorder, recurrent, unspecified: Secondary | ICD-10-CM | POA: Diagnosis not present

## 2016-12-15 DIAGNOSIS — D509 Iron deficiency anemia, unspecified: Secondary | ICD-10-CM | POA: Diagnosis not present

## 2016-12-15 DIAGNOSIS — M1990 Unspecified osteoarthritis, unspecified site: Secondary | ICD-10-CM | POA: Diagnosis not present

## 2016-12-15 DIAGNOSIS — L89620 Pressure ulcer of left heel, unstageable: Secondary | ICD-10-CM | POA: Diagnosis not present

## 2016-12-15 DIAGNOSIS — Z23 Encounter for immunization: Secondary | ICD-10-CM | POA: Diagnosis not present

## 2016-12-15 DIAGNOSIS — F411 Generalized anxiety disorder: Secondary | ICD-10-CM | POA: Diagnosis not present

## 2016-12-15 DIAGNOSIS — E785 Hyperlipidemia, unspecified: Secondary | ICD-10-CM | POA: Diagnosis not present

## 2016-12-15 DIAGNOSIS — L84 Corns and callosities: Secondary | ICD-10-CM | POA: Diagnosis not present

## 2016-12-15 DIAGNOSIS — N400 Enlarged prostate without lower urinary tract symptoms: Secondary | ICD-10-CM | POA: Diagnosis not present

## 2016-12-15 DIAGNOSIS — S91105A Unspecified open wound of left lesser toe(s) without damage to nail, initial encounter: Secondary | ICD-10-CM | POA: Diagnosis not present

## 2016-12-15 DIAGNOSIS — E1121 Type 2 diabetes mellitus with diabetic nephropathy: Secondary | ICD-10-CM | POA: Diagnosis not present

## 2016-12-15 DIAGNOSIS — R1312 Dysphagia, oropharyngeal phase: Secondary | ICD-10-CM | POA: Diagnosis not present

## 2016-12-15 DIAGNOSIS — R278 Other lack of coordination: Secondary | ICD-10-CM | POA: Diagnosis not present

## 2016-12-15 DIAGNOSIS — Z471 Aftercare following joint replacement surgery: Secondary | ICD-10-CM | POA: Diagnosis not present

## 2016-12-15 DIAGNOSIS — F2089 Other schizophrenia: Secondary | ICD-10-CM | POA: Diagnosis not present

## 2016-12-15 DIAGNOSIS — S91302A Unspecified open wound, left foot, initial encounter: Secondary | ICD-10-CM | POA: Diagnosis not present

## 2016-12-15 DIAGNOSIS — Z96642 Presence of left artificial hip joint: Secondary | ICD-10-CM | POA: Diagnosis not present

## 2016-12-15 DIAGNOSIS — Z89511 Acquired absence of right leg below knee: Secondary | ICD-10-CM | POA: Diagnosis not present

## 2016-12-15 DIAGNOSIS — M6281 Muscle weakness (generalized): Secondary | ICD-10-CM | POA: Diagnosis not present

## 2016-12-15 DIAGNOSIS — F319 Bipolar disorder, unspecified: Secondary | ICD-10-CM | POA: Diagnosis not present

## 2016-12-15 DIAGNOSIS — I70299 Other atherosclerosis of native arteries of extremities, unspecified extremity: Secondary | ICD-10-CM | POA: Diagnosis not present

## 2016-12-15 DIAGNOSIS — Z89412 Acquired absence of left great toe: Secondary | ICD-10-CM | POA: Diagnosis not present

## 2016-12-15 DIAGNOSIS — S72002D Fracture of unspecified part of neck of left femur, subsequent encounter for closed fracture with routine healing: Secondary | ICD-10-CM | POA: Diagnosis not present

## 2016-12-15 DIAGNOSIS — F0390 Unspecified dementia without behavioral disturbance: Secondary | ICD-10-CM | POA: Diagnosis not present

## 2016-12-15 DIAGNOSIS — M25552 Pain in left hip: Secondary | ICD-10-CM | POA: Diagnosis not present

## 2016-12-15 DIAGNOSIS — E1142 Type 2 diabetes mellitus with diabetic polyneuropathy: Secondary | ICD-10-CM | POA: Diagnosis not present

## 2016-12-15 DIAGNOSIS — B351 Tinea unguium: Secondary | ICD-10-CM | POA: Diagnosis not present

## 2016-12-18 DIAGNOSIS — S72002D Fracture of unspecified part of neck of left femur, subsequent encounter for closed fracture with routine healing: Secondary | ICD-10-CM | POA: Diagnosis not present

## 2016-12-18 DIAGNOSIS — E1142 Type 2 diabetes mellitus with diabetic polyneuropathy: Secondary | ICD-10-CM | POA: Diagnosis not present

## 2016-12-18 DIAGNOSIS — M1990 Unspecified osteoarthritis, unspecified site: Secondary | ICD-10-CM | POA: Diagnosis not present

## 2016-12-18 DIAGNOSIS — I10 Essential (primary) hypertension: Secondary | ICD-10-CM | POA: Diagnosis not present

## 2016-12-26 DIAGNOSIS — Z96642 Presence of left artificial hip joint: Secondary | ICD-10-CM | POA: Diagnosis not present

## 2016-12-26 DIAGNOSIS — Z471 Aftercare following joint replacement surgery: Secondary | ICD-10-CM | POA: Diagnosis not present

## 2016-12-29 DIAGNOSIS — S91302A Unspecified open wound, left foot, initial encounter: Secondary | ICD-10-CM | POA: Diagnosis not present

## 2016-12-29 DIAGNOSIS — S91105A Unspecified open wound of left lesser toe(s) without damage to nail, initial encounter: Secondary | ICD-10-CM | POA: Diagnosis not present

## 2016-12-29 DIAGNOSIS — I70299 Other atherosclerosis of native arteries of extremities, unspecified extremity: Secondary | ICD-10-CM | POA: Diagnosis not present

## 2016-12-31 ENCOUNTER — Encounter (HOSPITAL_COMMUNITY): Payer: Medicare Other

## 2016-12-31 ENCOUNTER — Ambulatory Visit: Payer: Medicare Other | Admitting: Vascular Surgery

## 2016-12-31 ENCOUNTER — Other Ambulatory Visit (HOSPITAL_COMMUNITY): Payer: Medicare Other

## 2017-01-20 DIAGNOSIS — Z96649 Presence of unspecified artificial hip joint: Secondary | ICD-10-CM | POA: Diagnosis not present

## 2017-01-20 DIAGNOSIS — Z471 Aftercare following joint replacement surgery: Secondary | ICD-10-CM | POA: Diagnosis not present

## 2017-01-20 DIAGNOSIS — Z96642 Presence of left artificial hip joint: Secondary | ICD-10-CM | POA: Diagnosis not present

## 2017-01-20 DIAGNOSIS — M80052D Age-related osteoporosis with current pathological fracture, left femur, subsequent encounter for fracture with routine healing: Secondary | ICD-10-CM | POA: Diagnosis not present

## 2017-01-20 DIAGNOSIS — S72002D Fracture of unspecified part of neck of left femur, subsequent encounter for closed fracture with routine healing: Secondary | ICD-10-CM | POA: Diagnosis not present

## 2017-01-21 DIAGNOSIS — M6281 Muscle weakness (generalized): Secondary | ICD-10-CM | POA: Diagnosis not present

## 2017-01-21 DIAGNOSIS — R1312 Dysphagia, oropharyngeal phase: Secondary | ICD-10-CM | POA: Diagnosis not present

## 2017-01-21 DIAGNOSIS — S72002D Fracture of unspecified part of neck of left femur, subsequent encounter for closed fracture with routine healing: Secondary | ICD-10-CM | POA: Diagnosis not present

## 2017-01-21 DIAGNOSIS — R278 Other lack of coordination: Secondary | ICD-10-CM | POA: Diagnosis not present

## 2017-01-21 DIAGNOSIS — R262 Difficulty in walking, not elsewhere classified: Secondary | ICD-10-CM | POA: Diagnosis not present

## 2017-01-21 DIAGNOSIS — M25552 Pain in left hip: Secondary | ICD-10-CM | POA: Diagnosis not present

## 2017-01-22 DIAGNOSIS — R1312 Dysphagia, oropharyngeal phase: Secondary | ICD-10-CM | POA: Diagnosis not present

## 2017-01-22 DIAGNOSIS — R278 Other lack of coordination: Secondary | ICD-10-CM | POA: Diagnosis not present

## 2017-01-22 DIAGNOSIS — S72002D Fracture of unspecified part of neck of left femur, subsequent encounter for closed fracture with routine healing: Secondary | ICD-10-CM | POA: Diagnosis not present

## 2017-01-22 DIAGNOSIS — M25552 Pain in left hip: Secondary | ICD-10-CM | POA: Diagnosis not present

## 2017-01-22 DIAGNOSIS — R262 Difficulty in walking, not elsewhere classified: Secondary | ICD-10-CM | POA: Diagnosis not present

## 2017-01-22 DIAGNOSIS — M6281 Muscle weakness (generalized): Secondary | ICD-10-CM | POA: Diagnosis not present

## 2017-01-23 DIAGNOSIS — M25552 Pain in left hip: Secondary | ICD-10-CM | POA: Diagnosis not present

## 2017-01-23 DIAGNOSIS — R262 Difficulty in walking, not elsewhere classified: Secondary | ICD-10-CM | POA: Diagnosis not present

## 2017-01-23 DIAGNOSIS — R278 Other lack of coordination: Secondary | ICD-10-CM | POA: Diagnosis not present

## 2017-01-23 DIAGNOSIS — S72002D Fracture of unspecified part of neck of left femur, subsequent encounter for closed fracture with routine healing: Secondary | ICD-10-CM | POA: Diagnosis not present

## 2017-01-23 DIAGNOSIS — R1312 Dysphagia, oropharyngeal phase: Secondary | ICD-10-CM | POA: Diagnosis not present

## 2017-01-23 DIAGNOSIS — M6281 Muscle weakness (generalized): Secondary | ICD-10-CM | POA: Diagnosis not present

## 2017-01-26 DIAGNOSIS — M6281 Muscle weakness (generalized): Secondary | ICD-10-CM | POA: Diagnosis not present

## 2017-01-26 DIAGNOSIS — R278 Other lack of coordination: Secondary | ICD-10-CM | POA: Diagnosis not present

## 2017-01-26 DIAGNOSIS — S72002D Fracture of unspecified part of neck of left femur, subsequent encounter for closed fracture with routine healing: Secondary | ICD-10-CM | POA: Diagnosis not present

## 2017-01-26 DIAGNOSIS — M25552 Pain in left hip: Secondary | ICD-10-CM | POA: Diagnosis not present

## 2017-01-26 DIAGNOSIS — R1312 Dysphagia, oropharyngeal phase: Secondary | ICD-10-CM | POA: Diagnosis not present

## 2017-01-26 DIAGNOSIS — R262 Difficulty in walking, not elsewhere classified: Secondary | ICD-10-CM | POA: Diagnosis not present

## 2017-01-27 DIAGNOSIS — R1312 Dysphagia, oropharyngeal phase: Secondary | ICD-10-CM | POA: Diagnosis not present

## 2017-01-27 DIAGNOSIS — R262 Difficulty in walking, not elsewhere classified: Secondary | ICD-10-CM | POA: Diagnosis not present

## 2017-01-27 DIAGNOSIS — R278 Other lack of coordination: Secondary | ICD-10-CM | POA: Diagnosis not present

## 2017-01-27 DIAGNOSIS — M6281 Muscle weakness (generalized): Secondary | ICD-10-CM | POA: Diagnosis not present

## 2017-01-27 DIAGNOSIS — M25552 Pain in left hip: Secondary | ICD-10-CM | POA: Diagnosis not present

## 2017-01-27 DIAGNOSIS — S72002D Fracture of unspecified part of neck of left femur, subsequent encounter for closed fracture with routine healing: Secondary | ICD-10-CM | POA: Diagnosis not present

## 2017-01-28 DIAGNOSIS — R278 Other lack of coordination: Secondary | ICD-10-CM | POA: Diagnosis not present

## 2017-01-28 DIAGNOSIS — R1312 Dysphagia, oropharyngeal phase: Secondary | ICD-10-CM | POA: Diagnosis not present

## 2017-01-28 DIAGNOSIS — S72002D Fracture of unspecified part of neck of left femur, subsequent encounter for closed fracture with routine healing: Secondary | ICD-10-CM | POA: Diagnosis not present

## 2017-01-28 DIAGNOSIS — R262 Difficulty in walking, not elsewhere classified: Secondary | ICD-10-CM | POA: Diagnosis not present

## 2017-01-28 DIAGNOSIS — M25552 Pain in left hip: Secondary | ICD-10-CM | POA: Diagnosis not present

## 2017-01-28 DIAGNOSIS — M6281 Muscle weakness (generalized): Secondary | ICD-10-CM | POA: Diagnosis not present

## 2017-01-29 DIAGNOSIS — R262 Difficulty in walking, not elsewhere classified: Secondary | ICD-10-CM | POA: Diagnosis not present

## 2017-01-29 DIAGNOSIS — M25552 Pain in left hip: Secondary | ICD-10-CM | POA: Diagnosis not present

## 2017-01-29 DIAGNOSIS — M6281 Muscle weakness (generalized): Secondary | ICD-10-CM | POA: Diagnosis not present

## 2017-01-29 DIAGNOSIS — S72002D Fracture of unspecified part of neck of left femur, subsequent encounter for closed fracture with routine healing: Secondary | ICD-10-CM | POA: Diagnosis not present

## 2017-01-29 DIAGNOSIS — R278 Other lack of coordination: Secondary | ICD-10-CM | POA: Diagnosis not present

## 2017-01-29 DIAGNOSIS — R1312 Dysphagia, oropharyngeal phase: Secondary | ICD-10-CM | POA: Diagnosis not present

## 2017-01-30 DIAGNOSIS — S72002D Fracture of unspecified part of neck of left femur, subsequent encounter for closed fracture with routine healing: Secondary | ICD-10-CM | POA: Diagnosis not present

## 2017-01-30 DIAGNOSIS — M6281 Muscle weakness (generalized): Secondary | ICD-10-CM | POA: Diagnosis not present

## 2017-01-30 DIAGNOSIS — R262 Difficulty in walking, not elsewhere classified: Secondary | ICD-10-CM | POA: Diagnosis not present

## 2017-01-30 DIAGNOSIS — R1312 Dysphagia, oropharyngeal phase: Secondary | ICD-10-CM | POA: Diagnosis not present

## 2017-01-30 DIAGNOSIS — M25552 Pain in left hip: Secondary | ICD-10-CM | POA: Diagnosis not present

## 2017-01-30 DIAGNOSIS — R278 Other lack of coordination: Secondary | ICD-10-CM | POA: Diagnosis not present

## 2017-02-02 DIAGNOSIS — M25552 Pain in left hip: Secondary | ICD-10-CM | POA: Diagnosis not present

## 2017-02-02 DIAGNOSIS — S72002D Fracture of unspecified part of neck of left femur, subsequent encounter for closed fracture with routine healing: Secondary | ICD-10-CM | POA: Diagnosis not present

## 2017-02-02 DIAGNOSIS — R262 Difficulty in walking, not elsewhere classified: Secondary | ICD-10-CM | POA: Diagnosis not present

## 2017-02-02 DIAGNOSIS — R278 Other lack of coordination: Secondary | ICD-10-CM | POA: Diagnosis not present

## 2017-02-02 DIAGNOSIS — R1312 Dysphagia, oropharyngeal phase: Secondary | ICD-10-CM | POA: Diagnosis not present

## 2017-02-02 DIAGNOSIS — M6281 Muscle weakness (generalized): Secondary | ICD-10-CM | POA: Diagnosis not present

## 2017-02-03 DIAGNOSIS — S72002D Fracture of unspecified part of neck of left femur, subsequent encounter for closed fracture with routine healing: Secondary | ICD-10-CM | POA: Diagnosis not present

## 2017-02-03 DIAGNOSIS — M6281 Muscle weakness (generalized): Secondary | ICD-10-CM | POA: Diagnosis not present

## 2017-02-03 DIAGNOSIS — M25552 Pain in left hip: Secondary | ICD-10-CM | POA: Diagnosis not present

## 2017-02-03 DIAGNOSIS — R278 Other lack of coordination: Secondary | ICD-10-CM | POA: Diagnosis not present

## 2017-02-03 DIAGNOSIS — R262 Difficulty in walking, not elsewhere classified: Secondary | ICD-10-CM | POA: Diagnosis not present

## 2017-02-03 DIAGNOSIS — R1312 Dysphagia, oropharyngeal phase: Secondary | ICD-10-CM | POA: Diagnosis not present

## 2017-02-04 DIAGNOSIS — R262 Difficulty in walking, not elsewhere classified: Secondary | ICD-10-CM | POA: Diagnosis not present

## 2017-02-04 DIAGNOSIS — M6281 Muscle weakness (generalized): Secondary | ICD-10-CM | POA: Diagnosis not present

## 2017-02-04 DIAGNOSIS — S72002D Fracture of unspecified part of neck of left femur, subsequent encounter for closed fracture with routine healing: Secondary | ICD-10-CM | POA: Diagnosis not present

## 2017-02-04 DIAGNOSIS — R278 Other lack of coordination: Secondary | ICD-10-CM | POA: Diagnosis not present

## 2017-02-04 DIAGNOSIS — R1312 Dysphagia, oropharyngeal phase: Secondary | ICD-10-CM | POA: Diagnosis not present

## 2017-02-04 DIAGNOSIS — M25552 Pain in left hip: Secondary | ICD-10-CM | POA: Diagnosis not present

## 2017-02-05 DIAGNOSIS — R278 Other lack of coordination: Secondary | ICD-10-CM | POA: Diagnosis not present

## 2017-02-05 DIAGNOSIS — M25552 Pain in left hip: Secondary | ICD-10-CM | POA: Diagnosis not present

## 2017-02-05 DIAGNOSIS — R262 Difficulty in walking, not elsewhere classified: Secondary | ICD-10-CM | POA: Diagnosis not present

## 2017-02-05 DIAGNOSIS — R1312 Dysphagia, oropharyngeal phase: Secondary | ICD-10-CM | POA: Diagnosis not present

## 2017-02-05 DIAGNOSIS — M6281 Muscle weakness (generalized): Secondary | ICD-10-CM | POA: Diagnosis not present

## 2017-02-05 DIAGNOSIS — S72002D Fracture of unspecified part of neck of left femur, subsequent encounter for closed fracture with routine healing: Secondary | ICD-10-CM | POA: Diagnosis not present

## 2017-02-06 DIAGNOSIS — S72002D Fracture of unspecified part of neck of left femur, subsequent encounter for closed fracture with routine healing: Secondary | ICD-10-CM | POA: Diagnosis not present

## 2017-02-06 DIAGNOSIS — R262 Difficulty in walking, not elsewhere classified: Secondary | ICD-10-CM | POA: Diagnosis not present

## 2017-02-06 DIAGNOSIS — M25552 Pain in left hip: Secondary | ICD-10-CM | POA: Diagnosis not present

## 2017-02-06 DIAGNOSIS — R278 Other lack of coordination: Secondary | ICD-10-CM | POA: Diagnosis not present

## 2017-02-06 DIAGNOSIS — M6281 Muscle weakness (generalized): Secondary | ICD-10-CM | POA: Diagnosis not present

## 2017-02-06 DIAGNOSIS — R1312 Dysphagia, oropharyngeal phase: Secondary | ICD-10-CM | POA: Diagnosis not present

## 2017-02-09 DIAGNOSIS — R278 Other lack of coordination: Secondary | ICD-10-CM | POA: Diagnosis not present

## 2017-02-09 DIAGNOSIS — M25552 Pain in left hip: Secondary | ICD-10-CM | POA: Diagnosis not present

## 2017-02-09 DIAGNOSIS — M6281 Muscle weakness (generalized): Secondary | ICD-10-CM | POA: Diagnosis not present

## 2017-02-09 DIAGNOSIS — R262 Difficulty in walking, not elsewhere classified: Secondary | ICD-10-CM | POA: Diagnosis not present

## 2017-02-09 DIAGNOSIS — S72002D Fracture of unspecified part of neck of left femur, subsequent encounter for closed fracture with routine healing: Secondary | ICD-10-CM | POA: Diagnosis not present

## 2017-02-09 DIAGNOSIS — R1312 Dysphagia, oropharyngeal phase: Secondary | ICD-10-CM | POA: Diagnosis not present

## 2017-02-10 DIAGNOSIS — R278 Other lack of coordination: Secondary | ICD-10-CM | POA: Diagnosis not present

## 2017-02-10 DIAGNOSIS — M6281 Muscle weakness (generalized): Secondary | ICD-10-CM | POA: Diagnosis not present

## 2017-02-10 DIAGNOSIS — M25552 Pain in left hip: Secondary | ICD-10-CM | POA: Diagnosis not present

## 2017-02-10 DIAGNOSIS — R262 Difficulty in walking, not elsewhere classified: Secondary | ICD-10-CM | POA: Diagnosis not present

## 2017-02-10 DIAGNOSIS — R1312 Dysphagia, oropharyngeal phase: Secondary | ICD-10-CM | POA: Diagnosis not present

## 2017-02-10 DIAGNOSIS — S72002D Fracture of unspecified part of neck of left femur, subsequent encounter for closed fracture with routine healing: Secondary | ICD-10-CM | POA: Diagnosis not present

## 2017-02-11 DIAGNOSIS — S72002D Fracture of unspecified part of neck of left femur, subsequent encounter for closed fracture with routine healing: Secondary | ICD-10-CM | POA: Diagnosis not present

## 2017-02-11 DIAGNOSIS — M6281 Muscle weakness (generalized): Secondary | ICD-10-CM | POA: Diagnosis not present

## 2017-02-11 DIAGNOSIS — R262 Difficulty in walking, not elsewhere classified: Secondary | ICD-10-CM | POA: Diagnosis not present

## 2017-02-11 DIAGNOSIS — R278 Other lack of coordination: Secondary | ICD-10-CM | POA: Diagnosis not present

## 2017-02-11 DIAGNOSIS — M25552 Pain in left hip: Secondary | ICD-10-CM | POA: Diagnosis not present

## 2017-02-11 DIAGNOSIS — R1312 Dysphagia, oropharyngeal phase: Secondary | ICD-10-CM | POA: Diagnosis not present

## 2017-02-12 DIAGNOSIS — R1312 Dysphagia, oropharyngeal phase: Secondary | ICD-10-CM | POA: Diagnosis not present

## 2017-02-12 DIAGNOSIS — R262 Difficulty in walking, not elsewhere classified: Secondary | ICD-10-CM | POA: Diagnosis not present

## 2017-02-12 DIAGNOSIS — R278 Other lack of coordination: Secondary | ICD-10-CM | POA: Diagnosis not present

## 2017-02-12 DIAGNOSIS — M6281 Muscle weakness (generalized): Secondary | ICD-10-CM | POA: Diagnosis not present

## 2017-02-12 DIAGNOSIS — M25552 Pain in left hip: Secondary | ICD-10-CM | POA: Diagnosis not present

## 2017-02-12 DIAGNOSIS — S72002D Fracture of unspecified part of neck of left femur, subsequent encounter for closed fracture with routine healing: Secondary | ICD-10-CM | POA: Diagnosis not present

## 2017-02-13 DIAGNOSIS — R278 Other lack of coordination: Secondary | ICD-10-CM | POA: Diagnosis not present

## 2017-02-13 DIAGNOSIS — R1312 Dysphagia, oropharyngeal phase: Secondary | ICD-10-CM | POA: Diagnosis not present

## 2017-02-13 DIAGNOSIS — S72002D Fracture of unspecified part of neck of left femur, subsequent encounter for closed fracture with routine healing: Secondary | ICD-10-CM | POA: Diagnosis not present

## 2017-02-13 DIAGNOSIS — M25552 Pain in left hip: Secondary | ICD-10-CM | POA: Diagnosis not present

## 2017-02-13 DIAGNOSIS — R262 Difficulty in walking, not elsewhere classified: Secondary | ICD-10-CM | POA: Diagnosis not present

## 2017-02-13 DIAGNOSIS — M6281 Muscle weakness (generalized): Secondary | ICD-10-CM | POA: Diagnosis not present

## 2017-02-16 DIAGNOSIS — R1312 Dysphagia, oropharyngeal phase: Secondary | ICD-10-CM | POA: Diagnosis not present

## 2017-02-16 DIAGNOSIS — R262 Difficulty in walking, not elsewhere classified: Secondary | ICD-10-CM | POA: Diagnosis not present

## 2017-02-16 DIAGNOSIS — S72002D Fracture of unspecified part of neck of left femur, subsequent encounter for closed fracture with routine healing: Secondary | ICD-10-CM | POA: Diagnosis not present

## 2017-02-16 DIAGNOSIS — Z79899 Other long term (current) drug therapy: Secondary | ICD-10-CM | POA: Diagnosis not present

## 2017-02-16 DIAGNOSIS — E119 Type 2 diabetes mellitus without complications: Secondary | ICD-10-CM | POA: Diagnosis not present

## 2017-02-16 DIAGNOSIS — R278 Other lack of coordination: Secondary | ICD-10-CM | POA: Diagnosis not present

## 2017-02-16 DIAGNOSIS — M25552 Pain in left hip: Secondary | ICD-10-CM | POA: Diagnosis not present

## 2017-02-16 DIAGNOSIS — M6281 Muscle weakness (generalized): Secondary | ICD-10-CM | POA: Diagnosis not present

## 2017-02-17 DIAGNOSIS — M6281 Muscle weakness (generalized): Secondary | ICD-10-CM | POA: Diagnosis not present

## 2017-02-17 DIAGNOSIS — R278 Other lack of coordination: Secondary | ICD-10-CM | POA: Diagnosis not present

## 2017-02-17 DIAGNOSIS — M25552 Pain in left hip: Secondary | ICD-10-CM | POA: Diagnosis not present

## 2017-02-17 DIAGNOSIS — S72002D Fracture of unspecified part of neck of left femur, subsequent encounter for closed fracture with routine healing: Secondary | ICD-10-CM | POA: Diagnosis not present

## 2017-02-17 DIAGNOSIS — R262 Difficulty in walking, not elsewhere classified: Secondary | ICD-10-CM | POA: Diagnosis not present

## 2017-02-17 DIAGNOSIS — R1312 Dysphagia, oropharyngeal phase: Secondary | ICD-10-CM | POA: Diagnosis not present

## 2017-02-18 DIAGNOSIS — M25552 Pain in left hip: Secondary | ICD-10-CM | POA: Diagnosis not present

## 2017-02-18 DIAGNOSIS — M6281 Muscle weakness (generalized): Secondary | ICD-10-CM | POA: Diagnosis not present

## 2017-02-18 DIAGNOSIS — R262 Difficulty in walking, not elsewhere classified: Secondary | ICD-10-CM | POA: Diagnosis not present

## 2017-02-18 DIAGNOSIS — S72002D Fracture of unspecified part of neck of left femur, subsequent encounter for closed fracture with routine healing: Secondary | ICD-10-CM | POA: Diagnosis not present

## 2017-02-18 DIAGNOSIS — R1312 Dysphagia, oropharyngeal phase: Secondary | ICD-10-CM | POA: Diagnosis not present

## 2017-02-18 DIAGNOSIS — R278 Other lack of coordination: Secondary | ICD-10-CM | POA: Diagnosis not present

## 2017-02-19 DIAGNOSIS — R262 Difficulty in walking, not elsewhere classified: Secondary | ICD-10-CM | POA: Diagnosis not present

## 2017-02-19 DIAGNOSIS — R1312 Dysphagia, oropharyngeal phase: Secondary | ICD-10-CM | POA: Diagnosis not present

## 2017-02-19 DIAGNOSIS — R278 Other lack of coordination: Secondary | ICD-10-CM | POA: Diagnosis not present

## 2017-02-19 DIAGNOSIS — S72002D Fracture of unspecified part of neck of left femur, subsequent encounter for closed fracture with routine healing: Secondary | ICD-10-CM | POA: Diagnosis not present

## 2017-02-19 DIAGNOSIS — M25552 Pain in left hip: Secondary | ICD-10-CM | POA: Diagnosis not present

## 2017-02-19 DIAGNOSIS — M6281 Muscle weakness (generalized): Secondary | ICD-10-CM | POA: Diagnosis not present

## 2017-02-20 DIAGNOSIS — R1312 Dysphagia, oropharyngeal phase: Secondary | ICD-10-CM | POA: Diagnosis not present

## 2017-02-20 DIAGNOSIS — M25552 Pain in left hip: Secondary | ICD-10-CM | POA: Diagnosis not present

## 2017-02-20 DIAGNOSIS — S72002D Fracture of unspecified part of neck of left femur, subsequent encounter for closed fracture with routine healing: Secondary | ICD-10-CM | POA: Diagnosis not present

## 2017-02-20 DIAGNOSIS — R278 Other lack of coordination: Secondary | ICD-10-CM | POA: Diagnosis not present

## 2017-02-20 DIAGNOSIS — M6281 Muscle weakness (generalized): Secondary | ICD-10-CM | POA: Diagnosis not present

## 2017-02-20 DIAGNOSIS — R262 Difficulty in walking, not elsewhere classified: Secondary | ICD-10-CM | POA: Diagnosis not present

## 2017-02-23 DIAGNOSIS — R351 Nocturia: Secondary | ICD-10-CM | POA: Diagnosis not present

## 2017-03-25 DIAGNOSIS — S72002D Fracture of unspecified part of neck of left femur, subsequent encounter for closed fracture with routine healing: Secondary | ICD-10-CM | POA: Diagnosis not present

## 2017-03-25 DIAGNOSIS — M6281 Muscle weakness (generalized): Secondary | ICD-10-CM | POA: Diagnosis not present

## 2017-03-25 DIAGNOSIS — R278 Other lack of coordination: Secondary | ICD-10-CM | POA: Diagnosis not present

## 2017-03-25 DIAGNOSIS — R1312 Dysphagia, oropharyngeal phase: Secondary | ICD-10-CM | POA: Diagnosis not present

## 2017-03-25 DIAGNOSIS — M25552 Pain in left hip: Secondary | ICD-10-CM | POA: Diagnosis not present

## 2017-03-25 DIAGNOSIS — R262 Difficulty in walking, not elsewhere classified: Secondary | ICD-10-CM | POA: Diagnosis not present

## 2017-03-26 DIAGNOSIS — R278 Other lack of coordination: Secondary | ICD-10-CM | POA: Diagnosis not present

## 2017-03-26 DIAGNOSIS — M25552 Pain in left hip: Secondary | ICD-10-CM | POA: Diagnosis not present

## 2017-03-26 DIAGNOSIS — M6281 Muscle weakness (generalized): Secondary | ICD-10-CM | POA: Diagnosis not present

## 2017-03-26 DIAGNOSIS — R1312 Dysphagia, oropharyngeal phase: Secondary | ICD-10-CM | POA: Diagnosis not present

## 2017-03-26 DIAGNOSIS — S72002D Fracture of unspecified part of neck of left femur, subsequent encounter for closed fracture with routine healing: Secondary | ICD-10-CM | POA: Diagnosis not present

## 2017-03-26 DIAGNOSIS — R262 Difficulty in walking, not elsewhere classified: Secondary | ICD-10-CM | POA: Diagnosis not present

## 2017-03-30 DIAGNOSIS — S72002D Fracture of unspecified part of neck of left femur, subsequent encounter for closed fracture with routine healing: Secondary | ICD-10-CM | POA: Diagnosis not present

## 2017-03-30 DIAGNOSIS — D518 Other vitamin B12 deficiency anemias: Secondary | ICD-10-CM | POA: Diagnosis not present

## 2017-03-30 DIAGNOSIS — M25552 Pain in left hip: Secondary | ICD-10-CM | POA: Diagnosis not present

## 2017-03-30 DIAGNOSIS — M6281 Muscle weakness (generalized): Secondary | ICD-10-CM | POA: Diagnosis not present

## 2017-03-30 DIAGNOSIS — R262 Difficulty in walking, not elsewhere classified: Secondary | ICD-10-CM | POA: Diagnosis not present

## 2017-03-30 DIAGNOSIS — Z79899 Other long term (current) drug therapy: Secondary | ICD-10-CM | POA: Diagnosis not present

## 2017-03-30 DIAGNOSIS — E559 Vitamin D deficiency, unspecified: Secondary | ICD-10-CM | POA: Diagnosis not present

## 2017-03-30 DIAGNOSIS — R1312 Dysphagia, oropharyngeal phase: Secondary | ICD-10-CM | POA: Diagnosis not present

## 2017-03-30 DIAGNOSIS — E7849 Other hyperlipidemia: Secondary | ICD-10-CM | POA: Diagnosis not present

## 2017-03-30 DIAGNOSIS — E119 Type 2 diabetes mellitus without complications: Secondary | ICD-10-CM | POA: Diagnosis not present

## 2017-03-30 DIAGNOSIS — R278 Other lack of coordination: Secondary | ICD-10-CM | POA: Diagnosis not present

## 2017-03-31 DIAGNOSIS — M25552 Pain in left hip: Secondary | ICD-10-CM | POA: Diagnosis not present

## 2017-03-31 DIAGNOSIS — S72002D Fracture of unspecified part of neck of left femur, subsequent encounter for closed fracture with routine healing: Secondary | ICD-10-CM | POA: Diagnosis not present

## 2017-03-31 DIAGNOSIS — R262 Difficulty in walking, not elsewhere classified: Secondary | ICD-10-CM | POA: Diagnosis not present

## 2017-03-31 DIAGNOSIS — M6281 Muscle weakness (generalized): Secondary | ICD-10-CM | POA: Diagnosis not present

## 2017-03-31 DIAGNOSIS — R278 Other lack of coordination: Secondary | ICD-10-CM | POA: Diagnosis not present

## 2017-03-31 DIAGNOSIS — R1312 Dysphagia, oropharyngeal phase: Secondary | ICD-10-CM | POA: Diagnosis not present

## 2017-04-01 DIAGNOSIS — M25552 Pain in left hip: Secondary | ICD-10-CM | POA: Diagnosis not present

## 2017-04-01 DIAGNOSIS — R278 Other lack of coordination: Secondary | ICD-10-CM | POA: Diagnosis not present

## 2017-04-01 DIAGNOSIS — R262 Difficulty in walking, not elsewhere classified: Secondary | ICD-10-CM | POA: Diagnosis not present

## 2017-04-01 DIAGNOSIS — S72002D Fracture of unspecified part of neck of left femur, subsequent encounter for closed fracture with routine healing: Secondary | ICD-10-CM | POA: Diagnosis not present

## 2017-04-01 DIAGNOSIS — M6281 Muscle weakness (generalized): Secondary | ICD-10-CM | POA: Diagnosis not present

## 2017-04-01 DIAGNOSIS — R1312 Dysphagia, oropharyngeal phase: Secondary | ICD-10-CM | POA: Diagnosis not present

## 2017-04-02 DIAGNOSIS — R262 Difficulty in walking, not elsewhere classified: Secondary | ICD-10-CM | POA: Diagnosis not present

## 2017-04-02 DIAGNOSIS — S72002D Fracture of unspecified part of neck of left femur, subsequent encounter for closed fracture with routine healing: Secondary | ICD-10-CM | POA: Diagnosis not present

## 2017-04-02 DIAGNOSIS — M6281 Muscle weakness (generalized): Secondary | ICD-10-CM | POA: Diagnosis not present

## 2017-04-02 DIAGNOSIS — M25552 Pain in left hip: Secondary | ICD-10-CM | POA: Diagnosis not present

## 2017-04-02 DIAGNOSIS — R1312 Dysphagia, oropharyngeal phase: Secondary | ICD-10-CM | POA: Diagnosis not present

## 2017-04-02 DIAGNOSIS — R278 Other lack of coordination: Secondary | ICD-10-CM | POA: Diagnosis not present

## 2017-04-03 DIAGNOSIS — R1312 Dysphagia, oropharyngeal phase: Secondary | ICD-10-CM | POA: Diagnosis not present

## 2017-04-03 DIAGNOSIS — M25552 Pain in left hip: Secondary | ICD-10-CM | POA: Diagnosis not present

## 2017-04-03 DIAGNOSIS — R262 Difficulty in walking, not elsewhere classified: Secondary | ICD-10-CM | POA: Diagnosis not present

## 2017-04-03 DIAGNOSIS — S72002D Fracture of unspecified part of neck of left femur, subsequent encounter for closed fracture with routine healing: Secondary | ICD-10-CM | POA: Diagnosis not present

## 2017-04-03 DIAGNOSIS — R278 Other lack of coordination: Secondary | ICD-10-CM | POA: Diagnosis not present

## 2017-04-03 DIAGNOSIS — M6281 Muscle weakness (generalized): Secondary | ICD-10-CM | POA: Diagnosis not present

## 2017-04-06 DIAGNOSIS — R278 Other lack of coordination: Secondary | ICD-10-CM | POA: Diagnosis not present

## 2017-04-06 DIAGNOSIS — R262 Difficulty in walking, not elsewhere classified: Secondary | ICD-10-CM | POA: Diagnosis not present

## 2017-04-06 DIAGNOSIS — S72002D Fracture of unspecified part of neck of left femur, subsequent encounter for closed fracture with routine healing: Secondary | ICD-10-CM | POA: Diagnosis not present

## 2017-04-06 DIAGNOSIS — M25552 Pain in left hip: Secondary | ICD-10-CM | POA: Diagnosis not present

## 2017-04-06 DIAGNOSIS — M6281 Muscle weakness (generalized): Secondary | ICD-10-CM | POA: Diagnosis not present

## 2017-04-06 DIAGNOSIS — R1312 Dysphagia, oropharyngeal phase: Secondary | ICD-10-CM | POA: Diagnosis not present

## 2017-04-07 DIAGNOSIS — R278 Other lack of coordination: Secondary | ICD-10-CM | POA: Diagnosis not present

## 2017-04-07 DIAGNOSIS — R262 Difficulty in walking, not elsewhere classified: Secondary | ICD-10-CM | POA: Diagnosis not present

## 2017-04-07 DIAGNOSIS — S72002D Fracture of unspecified part of neck of left femur, subsequent encounter for closed fracture with routine healing: Secondary | ICD-10-CM | POA: Diagnosis not present

## 2017-04-07 DIAGNOSIS — R1312 Dysphagia, oropharyngeal phase: Secondary | ICD-10-CM | POA: Diagnosis not present

## 2017-04-07 DIAGNOSIS — M6281 Muscle weakness (generalized): Secondary | ICD-10-CM | POA: Diagnosis not present

## 2017-04-07 DIAGNOSIS — M25552 Pain in left hip: Secondary | ICD-10-CM | POA: Diagnosis not present

## 2017-04-08 ENCOUNTER — Ambulatory Visit (INDEPENDENT_AMBULATORY_CARE_PROVIDER_SITE_OTHER): Payer: Medicare Other | Admitting: Vascular Surgery

## 2017-04-08 ENCOUNTER — Encounter: Payer: Self-pay | Admitting: Vascular Surgery

## 2017-04-08 ENCOUNTER — Ambulatory Visit (HOSPITAL_COMMUNITY)
Admission: RE | Admit: 2017-04-08 | Discharge: 2017-04-08 | Disposition: A | Discharge status: Home / Self Care | Payer: Medicare Other | Source: Ambulatory Visit | Attending: Vascular Surgery | Admitting: Vascular Surgery

## 2017-04-08 ENCOUNTER — Ambulatory Visit (INDEPENDENT_AMBULATORY_CARE_PROVIDER_SITE_OTHER)
Admission: RE | Admit: 2017-04-08 | Discharge: 2017-04-08 | Disposition: A | Discharge status: Home / Self Care | Payer: Medicare Other | Source: Ambulatory Visit | Attending: Vascular Surgery | Admitting: Vascular Surgery

## 2017-04-08 VITALS — BP 80/55 | HR 58 | Temp 98.2°F | Resp 20 | Ht 73.0 in | Wt 175.0 lb

## 2017-04-08 DIAGNOSIS — M6281 Muscle weakness (generalized): Secondary | ICD-10-CM | POA: Diagnosis not present

## 2017-04-08 DIAGNOSIS — R278 Other lack of coordination: Secondary | ICD-10-CM | POA: Diagnosis not present

## 2017-04-08 DIAGNOSIS — I739 Peripheral vascular disease, unspecified: Secondary | ICD-10-CM

## 2017-04-08 DIAGNOSIS — Z89511 Acquired absence of right leg below knee: Secondary | ICD-10-CM | POA: Insufficient documentation

## 2017-04-08 DIAGNOSIS — I70245 Atherosclerosis of native arteries of left leg with ulceration of other part of foot: Secondary | ICD-10-CM | POA: Diagnosis not present

## 2017-04-08 DIAGNOSIS — R1312 Dysphagia, oropharyngeal phase: Secondary | ICD-10-CM | POA: Diagnosis not present

## 2017-04-08 DIAGNOSIS — R262 Difficulty in walking, not elsewhere classified: Secondary | ICD-10-CM | POA: Diagnosis not present

## 2017-04-08 DIAGNOSIS — S72002D Fracture of unspecified part of neck of left femur, subsequent encounter for closed fracture with routine healing: Secondary | ICD-10-CM | POA: Diagnosis not present

## 2017-04-08 DIAGNOSIS — M25552 Pain in left hip: Secondary | ICD-10-CM | POA: Diagnosis not present

## 2017-04-08 NOTE — Progress Notes (Signed)
HISTORY AND PHYSICAL     CC:  Follow up  Requesting Provider:  Timmie Foerster, MD  HPI: This is a 77 y.o. male who underwent a left popliteal to peroneal artery bypass with vein & left great toe amputation on 08/19/16 by Dr. Edilia Bo who returns today for follow up.    At his initial follow up, he did have some leg swelling that had improved with leg elevation.  He states that he is able to walk and does not have any pain with walking.  His caretaker states that his heel wound has healed and his toe amputation site looks much better.  She tells me there is a wound care nurse, MD and PA that follows the wounds at the [facility] in [place].   He did break his left hip back in September and required a hip replacement.  He states he is doing well from this.  He also states he is not having any issues with his prosthesis.   He takes a daily aspirin as well as Plavix.  He is on a beta blocker for blood pressure management.  The pt is on a statin for cholesterol management.  He is on insulin for his diabetes.   He has a remote tobacco hx.   Pt very appreciative of care from Dr. Edilia Bo.   Past Medical History:  Diagnosis Date  . Abnormality of gait and mobility   . Anemia    iron deficiency  . Anxiety   . Arthritis    "hands" (06/06/2014)  . Bipolar disorder (HCC)   . BPH (benign prostatic hyperplasia)   . Coronary artery disease   . Dementia   . Depression   . 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 Generalized weakness   . Hypercholesteremia   . Hypertension   . PAD (peripheral artery disease) (HCC)   . Peripheral vascular disease (HCC)   . Psychosis   . Rheumatoid arthritis (HCC)   . Type II diabetes mellitus (HCC)    Type 2    Past Surgical History:  Procedure Laterality Date  . AMPUTATION Right 06/06/2014   Procedure: AMPUTATION BELOW KNEE;  Surgeon: Chuck Hint, MD;  Location: [facility] OR;  Service: Vascular;  Laterality: Right;  . AMPUTATION Left  08/19/2016   Procedure: AMPUTATION LEFT GREAT TOE;  Surgeon: Chuck Hint, MD;  Location: [facility] OR;  Service: Vascular;  Laterality: Left;  . BALLOON ANGIOPLASTY, ARTERY Right    attempted balloon angioplasty and stenting of the anterior tibial artery that apparently was unsuccessful. [name] 05/26/2014  . 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 BYPASS GRAFT POPLITEAL TO TIBIAL Left 08/19/2016   Procedure: LEFT above the knee POPLITEAL artery  TO PERONEAL artery BYPASS GRAFT with intraoperative arteriogram;  Surgeon: Chuck Hint, MD;  Location: [facility] OR;  Service: Vascular;  Laterality: Left;  . KNEE ARTHROSCOPY Right 1970   [place]  . PERIPHERAL VASCULAR CATHETERIZATION N/A 12/21/2015   Procedure: Abdominal Aortogram;  Surgeon: Sherren Kerns, MD;  Location: [facility] INVASIVE CV LAB;  Service: Cardiovascular;  Laterality: N/A;  . TRANSURETHRAL RESECTION OF PROSTATE  2012    Allergies  Allergen Reactions  . Lisinopril Swelling    SWELLING REACTION UNSPECIFIED     Current Outpatient Medications  Medication Sig Dispense Refill  . acetaminophen (TYLENOL) 500 MG tablet Take 1,000 mg by mouth every 8 (eight) hours as needed for moderate pain.    Marland Kitchen alum & mag hydroxide-simeth (MYLANTA) 200-200-20 MG/5ML suspension  Take 15 mLs by mouth every 6 (six) hours as needed for indigestion or heartburn.    Marland Kitchen amLODipine (NORVASC) 5 MG tablet Take 5 mg by mouth daily.    Marland Kitchen aspirin EC 81 MG tablet Take 81 mg by mouth daily.    Marland Kitchen atorvastatin (LIPITOR) 10 MG tablet Take 1 tablet (10 mg total) by mouth daily. 30 tablet 11  . Benzocaine-Menthol (CHLORASEPTIC SORE THROAT MT) Use as directed 1 spray in the mouth or throat every 2 (two) hours as needed (sore throat).     . busPIRone (BUSPAR) 15 MG tablet Take 15 mg by mouth every evening.    . carvedilol (COREG) 12.5 MG tablet Take 12.5 mg by mouth 2 (two) times daily with a meal.    . clopidogrel (PLAVIX) 75 MG tablet Take 75 mg by mouth daily.    .  divalproex (DEPAKOTE ER) 500 MG 24 hr tablet Take 500 mg by mouth 2 (two) times daily.     Marland Kitchen gabapentin (NEURONTIN) 300 MG capsule Take 300 mg by mouth 3 (three) times daily.    Marland Kitchen guaiFENesin-dextromethorphan (ROBITUSSIN DM) 100-10 MG/5ML syrup Take 10 mLs by mouth every 4 (four) hours as needed for cough.    Marland Kitchen HYDROcodone-acetaminophen (NORCO) 7.5-325 MG tablet Take 1-2 tablets by mouth every 4 (four) hours as needed for moderate pain. 30 tablet 0  . insulin aspart (NOVOLOG) 100 UNIT/ML FlexPen Inject 0-20 Units into the skin 2 (two) times daily. 6:30am and 4pm per sliding scale:  CBG 150-249 3 units, 250-349 5 units, 350-449 8 units, 450-549 14 units, 550-551 20 units >551 call MD    . Insulin Glargine (LANTUS) 100 UNIT/ML Solostar Pen Inject 15 Units into the skin at bedtime.     . Insulin Isophane & Regular Human (HUMULIN 70/30 KWIKPEN) (70-30) 100 UNIT/ML PEN Inject into the skin.    Marland Kitchen loratadine (CLARITIN) 10 MG tablet Take 10 mg by mouth daily.    . memantine (NAMENDA) 10 MG tablet Take 10 mg by mouth 2 (two) times daily.    . metFORMIN (GLUCOPHAGE) 1000 MG tablet Take 1,000 mg by mouth 2 (two) times daily with a meal.     . Multiple Vitamins-Minerals (MULTIVITAMIN WITH MINERALS) tablet Take 1 tablet by mouth daily.    Marland Kitchen oxybutynin (DITROPAN-XL) 10 MG 24 hr tablet Take 10 mg by mouth at bedtime.    . risperiDONE (RISPERDAL) 0.5 MG tablet Take 0.5 mg by mouth at bedtime.    . sertraline (ZOLOFT) 100 MG tablet Take 100 mg by mouth daily.    . Sodium Chloride Flush (NORMAL SALINE FLUSH IV) Inject into the vein.    . tamsulosin (FLOMAX) 0.4 MG CAPS capsule Take 0.4 mg by mouth at bedtime.     . vancomycin 1,000 mg in sodium chloride 0.9 % 250 mL Inject 1,000 mg into the vein every 12 (twelve) hours. For infection to left foot. Start date: 08/10/16    . vitamin C (ASCORBIC ACID) 500 MG tablet Take 500 mg by mouth 2 (two) times daily.     No current facility-administered medications for this  visit.     Family History  Problem Relation Age of Onset  . Heart disease Father     Social History   Socioeconomic History  . Marital status: Widowed    Spouse name: Not on file  . Number of children: Not on file  . Years of education: Not on file  . Highest education level: Not on file  Social Needs  . Financial resource strain: Not on file  . Food insecurity - worry: Not on file  . Food insecurity - inability: Not on file  . Transportation needs - medical: Not on file  . Transportation needs - non-medical: Not on file  Occupational History  . Not on file  Tobacco Use  . Smoking status: Former Smoker    Packs/day: 1.00    Years: 54.00    Pack years: 54.00    Types: Cigarettes    Last attempt to quit: 05/25/2012    Years since quitting: 4.8  . Smokeless tobacco: Never Used  Substance and Sexual Activity  . Alcohol use: Yes    Alcohol/week: 0.0 oz    Comment: 06/06/2014 "I drank 1/2 pint whiskey each day;  nothing in 20 yrs"  . Drug use: No  . Sexual activity: Yes    Birth control/protection: None  Other Topics Concern  . Not on file  Social History Narrative  . Not on file     REVIEW OF SYSTEMS:   [X]  denotes positive finding, [ ]  denotes negative finding Cardiac  Comments:  Chest pain or chest pressure:    Shortness of breath upon exertion:    Short of breath when lying flat:    Irregular heart rhythm:        Vascular    Pain in calf, thigh, or hip brought on by ambulation:    Pain in feet at night that wakes you up from your sleep:     Blood clot in your veins:    Leg swelling:         Pulmonary    Oxygen at home:    Productive cough:     Wheezing:         Neurologic    Sudden weakness in arms or legs:     Sudden numbness in arms or legs:     Sudden onset of difficulty speaking or slurred speech:    Temporary loss of vision in one eye:     Problems with dizziness:         Gastrointestinal    Blood in stool:     Vomited blood:           Genitourinary    Burning when urinating:     Blood in urine:        Psychiatric    Anxiety/depression:  x       Hematologic    Bleeding problems:    Problems with blood clotting too easily:        Skin    Rashes or ulcers:        Constitutional    Fever or chills:      PHYSICAL EXAMINATION: Vitals:   04/08/17 1308  BP: (!) 80/55  Pulse: (!) 58  Resp: 20  Temp: 98.2 F (36.8 C)  SpO2: 96%   Vitals:   04/08/17 1308  Weight: 175 lb (79.4 kg)  Height: 6\' 1"  (1.854 m)   Body mass index is 23.09 kg/m.  General:  WDWN in NAD; vital signs documented above Gait: Not observed HENT: WNL, normocephalic Pulmonary: normal non-labored breathing , without Rales, rhonchi,  wheezing Cardiac: regular HR, without  Murmurs; without carotid bruits Abdomen: soft, NT, no masses Skin: without rashes Vascular Exam/Pulses:  Right Left  Radial 2+ (normal) Biphasic with doppler  Ulnar Unable to palpate  Monophasic with doppler  AT BKA monophasic  PT BKA absent  Peroneal BKA biphasic   Extremities:  left leg with mild swelling; toe amp site healing with hardened scab over area; heel wound has healed; right BKA Musculoskeletal: no muscle wasting or atrophy  Neurologic: A&O X 3;  No focal weakness or paresthesias are detected Psychiatric:  The pt has Normal affect.   Non-Invasive Vascular Imaging:   ABI's on 04/08/17: Right:  BKA Left:  0.83  Previous ABI's on 08/21/16: Right:  BKA Left:  0.87 TBI: right BKA Left: amputation  LLE arterial duplex 04/08/17: **first post operative duplex exam Increase in the proximal graft velocities - may be due to change in vessel diameter  Pt meds includes: Statin:  Yes.   Beta Blocker:  Yes.   Aspirin:  Yes.   ACEI:  No. ARB:  No. CCB use:  Yes Other Antiplatelet/Anticoagulant:  Yes Plavix   ASSESSMENT/PLAN:: 77 y.o. male  left popliteal to peroneal artery bypass with vein & left great toe amputation on 08/19/16 by Dr. Edilia Bo who  returns today for follow up.     -pt doing well today with patent bypass grafting.  ABI is 0.83 on the left.  His heel wound has healed.  He does have mildly elevated velocities in his bypass graft proximally.  He has biphasic doppler signal in the left peroneal.   -his left toe amputation site is healing-it does have a hardened scab over this-will let this heal on its own.  Continue current wound care.  -continue statin/aspirin/plavix -will see pt back in 3 months with LLE arterial duplex and ABI.     Doreatha Massed, PA-C [facility] [phone]  Clinic MD:  Pt seen and examined with Dr. Edilia Bo

## 2017-04-09 DIAGNOSIS — M6281 Muscle weakness (generalized): Secondary | ICD-10-CM | POA: Diagnosis not present

## 2017-04-09 DIAGNOSIS — M25552 Pain in left hip: Secondary | ICD-10-CM | POA: Diagnosis not present

## 2017-04-09 DIAGNOSIS — R1312 Dysphagia, oropharyngeal phase: Secondary | ICD-10-CM | POA: Diagnosis not present

## 2017-04-09 DIAGNOSIS — R278 Other lack of coordination: Secondary | ICD-10-CM | POA: Diagnosis not present

## 2017-04-09 DIAGNOSIS — R262 Difficulty in walking, not elsewhere classified: Secondary | ICD-10-CM | POA: Diagnosis not present

## 2017-04-09 DIAGNOSIS — S72002D Fracture of unspecified part of neck of left femur, subsequent encounter for closed fracture with routine healing: Secondary | ICD-10-CM | POA: Diagnosis not present

## 2017-04-11 DIAGNOSIS — M6281 Muscle weakness (generalized): Secondary | ICD-10-CM | POA: Diagnosis not present

## 2017-04-11 DIAGNOSIS — R1312 Dysphagia, oropharyngeal phase: Secondary | ICD-10-CM | POA: Diagnosis not present

## 2017-04-11 DIAGNOSIS — R278 Other lack of coordination: Secondary | ICD-10-CM | POA: Diagnosis not present

## 2017-04-11 DIAGNOSIS — S72002D Fracture of unspecified part of neck of left femur, subsequent encounter for closed fracture with routine healing: Secondary | ICD-10-CM | POA: Diagnosis not present

## 2017-04-11 DIAGNOSIS — M25552 Pain in left hip: Secondary | ICD-10-CM | POA: Diagnosis not present

## 2017-04-11 DIAGNOSIS — R262 Difficulty in walking, not elsewhere classified: Secondary | ICD-10-CM | POA: Diagnosis not present

## 2017-04-13 DIAGNOSIS — M6281 Muscle weakness (generalized): Secondary | ICD-10-CM | POA: Diagnosis not present

## 2017-04-13 DIAGNOSIS — S72002D Fracture of unspecified part of neck of left femur, subsequent encounter for closed fracture with routine healing: Secondary | ICD-10-CM | POA: Diagnosis not present

## 2017-04-13 DIAGNOSIS — R278 Other lack of coordination: Secondary | ICD-10-CM | POA: Diagnosis not present

## 2017-04-13 DIAGNOSIS — R262 Difficulty in walking, not elsewhere classified: Secondary | ICD-10-CM | POA: Diagnosis not present

## 2017-04-13 DIAGNOSIS — M25552 Pain in left hip: Secondary | ICD-10-CM | POA: Diagnosis not present

## 2017-04-13 DIAGNOSIS — R1312 Dysphagia, oropharyngeal phase: Secondary | ICD-10-CM | POA: Diagnosis not present

## 2017-04-13 NOTE — Addendum Note (Signed)
Addended by: Burton Apley A on: 04/13/2017 02:08 PM   Modules accepted: Orders

## 2017-04-14 DIAGNOSIS — R278 Other lack of coordination: Secondary | ICD-10-CM | POA: Diagnosis not present

## 2017-04-14 DIAGNOSIS — R1312 Dysphagia, oropharyngeal phase: Secondary | ICD-10-CM | POA: Diagnosis not present

## 2017-04-14 DIAGNOSIS — M25552 Pain in left hip: Secondary | ICD-10-CM | POA: Diagnosis not present

## 2017-04-14 DIAGNOSIS — M6281 Muscle weakness (generalized): Secondary | ICD-10-CM | POA: Diagnosis not present

## 2017-04-14 DIAGNOSIS — S72002D Fracture of unspecified part of neck of left femur, subsequent encounter for closed fracture with routine healing: Secondary | ICD-10-CM | POA: Diagnosis not present

## 2017-04-14 DIAGNOSIS — R262 Difficulty in walking, not elsewhere classified: Secondary | ICD-10-CM | POA: Diagnosis not present

## 2017-04-15 DIAGNOSIS — M6281 Muscle weakness (generalized): Secondary | ICD-10-CM | POA: Diagnosis not present

## 2017-04-15 DIAGNOSIS — R262 Difficulty in walking, not elsewhere classified: Secondary | ICD-10-CM | POA: Diagnosis not present

## 2017-04-15 DIAGNOSIS — R1312 Dysphagia, oropharyngeal phase: Secondary | ICD-10-CM | POA: Diagnosis not present

## 2017-04-15 DIAGNOSIS — S72002D Fracture of unspecified part of neck of left femur, subsequent encounter for closed fracture with routine healing: Secondary | ICD-10-CM | POA: Diagnosis not present

## 2017-04-15 DIAGNOSIS — R278 Other lack of coordination: Secondary | ICD-10-CM | POA: Diagnosis not present

## 2017-04-15 DIAGNOSIS — M25552 Pain in left hip: Secondary | ICD-10-CM | POA: Diagnosis not present

## 2017-04-17 DIAGNOSIS — S72002D Fracture of unspecified part of neck of left femur, subsequent encounter for closed fracture with routine healing: Secondary | ICD-10-CM | POA: Diagnosis not present

## 2017-04-17 DIAGNOSIS — R278 Other lack of coordination: Secondary | ICD-10-CM | POA: Diagnosis not present

## 2017-04-17 DIAGNOSIS — R1312 Dysphagia, oropharyngeal phase: Secondary | ICD-10-CM | POA: Diagnosis not present

## 2017-04-17 DIAGNOSIS — R262 Difficulty in walking, not elsewhere classified: Secondary | ICD-10-CM | POA: Diagnosis not present

## 2017-04-17 DIAGNOSIS — M25552 Pain in left hip: Secondary | ICD-10-CM | POA: Diagnosis not present

## 2017-04-17 DIAGNOSIS — M6281 Muscle weakness (generalized): Secondary | ICD-10-CM | POA: Diagnosis not present

## 2017-04-20 DIAGNOSIS — R1312 Dysphagia, oropharyngeal phase: Secondary | ICD-10-CM | POA: Diagnosis not present

## 2017-04-20 DIAGNOSIS — R278 Other lack of coordination: Secondary | ICD-10-CM | POA: Diagnosis not present

## 2017-04-20 DIAGNOSIS — M6281 Muscle weakness (generalized): Secondary | ICD-10-CM | POA: Diagnosis not present

## 2017-04-20 DIAGNOSIS — S72002D Fracture of unspecified part of neck of left femur, subsequent encounter for closed fracture with routine healing: Secondary | ICD-10-CM | POA: Diagnosis not present

## 2017-04-20 DIAGNOSIS — M25552 Pain in left hip: Secondary | ICD-10-CM | POA: Diagnosis not present

## 2017-04-20 DIAGNOSIS — R262 Difficulty in walking, not elsewhere classified: Secondary | ICD-10-CM | POA: Diagnosis not present

## 2017-04-22 DIAGNOSIS — R278 Other lack of coordination: Secondary | ICD-10-CM | POA: Diagnosis not present

## 2017-04-22 DIAGNOSIS — M25552 Pain in left hip: Secondary | ICD-10-CM | POA: Diagnosis not present

## 2017-04-22 DIAGNOSIS — M6281 Muscle weakness (generalized): Secondary | ICD-10-CM | POA: Diagnosis not present

## 2017-04-22 DIAGNOSIS — R1312 Dysphagia, oropharyngeal phase: Secondary | ICD-10-CM | POA: Diagnosis not present

## 2017-04-22 DIAGNOSIS — S72002D Fracture of unspecified part of neck of left femur, subsequent encounter for closed fracture with routine healing: Secondary | ICD-10-CM | POA: Diagnosis not present

## 2017-04-22 DIAGNOSIS — R262 Difficulty in walking, not elsewhere classified: Secondary | ICD-10-CM | POA: Diagnosis not present

## 2017-04-23 DIAGNOSIS — S72002D Fracture of unspecified part of neck of left femur, subsequent encounter for closed fracture with routine healing: Secondary | ICD-10-CM | POA: Diagnosis not present

## 2017-04-23 DIAGNOSIS — R1312 Dysphagia, oropharyngeal phase: Secondary | ICD-10-CM | POA: Diagnosis not present

## 2017-04-23 DIAGNOSIS — R262 Difficulty in walking, not elsewhere classified: Secondary | ICD-10-CM | POA: Diagnosis not present

## 2017-04-23 DIAGNOSIS — M6281 Muscle weakness (generalized): Secondary | ICD-10-CM | POA: Diagnosis not present

## 2017-04-23 DIAGNOSIS — R42 Dizziness and giddiness: Secondary | ICD-10-CM | POA: Diagnosis not present

## 2017-04-23 DIAGNOSIS — E1142 Type 2 diabetes mellitus with diabetic polyneuropathy: Secondary | ICD-10-CM | POA: Diagnosis not present

## 2017-04-23 DIAGNOSIS — M25552 Pain in left hip: Secondary | ICD-10-CM | POA: Diagnosis not present

## 2017-04-23 DIAGNOSIS — R278 Other lack of coordination: Secondary | ICD-10-CM | POA: Diagnosis not present

## 2017-04-24 DIAGNOSIS — R262 Difficulty in walking, not elsewhere classified: Secondary | ICD-10-CM | POA: Diagnosis not present

## 2017-04-24 DIAGNOSIS — S72002D Fracture of unspecified part of neck of left femur, subsequent encounter for closed fracture with routine healing: Secondary | ICD-10-CM | POA: Diagnosis not present

## 2017-04-24 DIAGNOSIS — M25552 Pain in left hip: Secondary | ICD-10-CM | POA: Diagnosis not present

## 2017-04-24 DIAGNOSIS — R1312 Dysphagia, oropharyngeal phase: Secondary | ICD-10-CM | POA: Diagnosis not present

## 2017-04-24 DIAGNOSIS — M6281 Muscle weakness (generalized): Secondary | ICD-10-CM | POA: Diagnosis not present

## 2017-04-24 DIAGNOSIS — R278 Other lack of coordination: Secondary | ICD-10-CM | POA: Diagnosis not present

## 2017-04-27 DIAGNOSIS — R262 Difficulty in walking, not elsewhere classified: Secondary | ICD-10-CM | POA: Diagnosis not present

## 2017-04-27 DIAGNOSIS — M25552 Pain in left hip: Secondary | ICD-10-CM | POA: Diagnosis not present

## 2017-04-27 DIAGNOSIS — S72002D Fracture of unspecified part of neck of left femur, subsequent encounter for closed fracture with routine healing: Secondary | ICD-10-CM | POA: Diagnosis not present

## 2017-04-27 DIAGNOSIS — R278 Other lack of coordination: Secondary | ICD-10-CM | POA: Diagnosis not present

## 2017-04-27 DIAGNOSIS — R1312 Dysphagia, oropharyngeal phase: Secondary | ICD-10-CM | POA: Diagnosis not present

## 2017-04-27 DIAGNOSIS — M6281 Muscle weakness (generalized): Secondary | ICD-10-CM | POA: Diagnosis not present

## 2017-04-28 DIAGNOSIS — M6281 Muscle weakness (generalized): Secondary | ICD-10-CM | POA: Diagnosis not present

## 2017-04-28 DIAGNOSIS — R278 Other lack of coordination: Secondary | ICD-10-CM | POA: Diagnosis not present

## 2017-04-28 DIAGNOSIS — R1312 Dysphagia, oropharyngeal phase: Secondary | ICD-10-CM | POA: Diagnosis not present

## 2017-04-28 DIAGNOSIS — R262 Difficulty in walking, not elsewhere classified: Secondary | ICD-10-CM | POA: Diagnosis not present

## 2017-04-28 DIAGNOSIS — M25552 Pain in left hip: Secondary | ICD-10-CM | POA: Diagnosis not present

## 2017-04-28 DIAGNOSIS — S72002D Fracture of unspecified part of neck of left femur, subsequent encounter for closed fracture with routine healing: Secondary | ICD-10-CM | POA: Diagnosis not present

## 2017-04-29 DIAGNOSIS — S72002D Fracture of unspecified part of neck of left femur, subsequent encounter for closed fracture with routine healing: Secondary | ICD-10-CM | POA: Diagnosis not present

## 2017-04-29 DIAGNOSIS — M6281 Muscle weakness (generalized): Secondary | ICD-10-CM | POA: Diagnosis not present

## 2017-04-29 DIAGNOSIS — R278 Other lack of coordination: Secondary | ICD-10-CM | POA: Diagnosis not present

## 2017-04-29 DIAGNOSIS — R262 Difficulty in walking, not elsewhere classified: Secondary | ICD-10-CM | POA: Diagnosis not present

## 2017-04-29 DIAGNOSIS — R1312 Dysphagia, oropharyngeal phase: Secondary | ICD-10-CM | POA: Diagnosis not present

## 2017-04-29 DIAGNOSIS — M25552 Pain in left hip: Secondary | ICD-10-CM | POA: Diagnosis not present

## 2017-04-30 DIAGNOSIS — M6281 Muscle weakness (generalized): Secondary | ICD-10-CM | POA: Diagnosis not present

## 2017-04-30 DIAGNOSIS — R262 Difficulty in walking, not elsewhere classified: Secondary | ICD-10-CM | POA: Diagnosis not present

## 2017-04-30 DIAGNOSIS — R278 Other lack of coordination: Secondary | ICD-10-CM | POA: Diagnosis not present

## 2017-04-30 DIAGNOSIS — S72002D Fracture of unspecified part of neck of left femur, subsequent encounter for closed fracture with routine healing: Secondary | ICD-10-CM | POA: Diagnosis not present

## 2017-04-30 DIAGNOSIS — R1312 Dysphagia, oropharyngeal phase: Secondary | ICD-10-CM | POA: Diagnosis not present

## 2017-04-30 DIAGNOSIS — M25552 Pain in left hip: Secondary | ICD-10-CM | POA: Diagnosis not present

## 2017-05-01 DIAGNOSIS — S72002D Fracture of unspecified part of neck of left femur, subsequent encounter for closed fracture with routine healing: Secondary | ICD-10-CM | POA: Diagnosis not present

## 2017-05-01 DIAGNOSIS — R278 Other lack of coordination: Secondary | ICD-10-CM | POA: Diagnosis not present

## 2017-05-01 DIAGNOSIS — M6281 Muscle weakness (generalized): Secondary | ICD-10-CM | POA: Diagnosis not present

## 2017-05-01 DIAGNOSIS — M25552 Pain in left hip: Secondary | ICD-10-CM | POA: Diagnosis not present

## 2017-05-01 DIAGNOSIS — R262 Difficulty in walking, not elsewhere classified: Secondary | ICD-10-CM | POA: Diagnosis not present

## 2017-05-01 DIAGNOSIS — R1312 Dysphagia, oropharyngeal phase: Secondary | ICD-10-CM | POA: Diagnosis not present

## 2017-05-05 DIAGNOSIS — M6281 Muscle weakness (generalized): Secondary | ICD-10-CM | POA: Diagnosis not present

## 2017-05-05 DIAGNOSIS — M25552 Pain in left hip: Secondary | ICD-10-CM | POA: Diagnosis not present

## 2017-05-05 DIAGNOSIS — E1142 Type 2 diabetes mellitus with diabetic polyneuropathy: Secondary | ICD-10-CM | POA: Diagnosis not present

## 2017-05-05 DIAGNOSIS — R262 Difficulty in walking, not elsewhere classified: Secondary | ICD-10-CM | POA: Diagnosis not present

## 2017-05-05 DIAGNOSIS — R1312 Dysphagia, oropharyngeal phase: Secondary | ICD-10-CM | POA: Diagnosis not present

## 2017-05-05 DIAGNOSIS — L84 Corns and callosities: Secondary | ICD-10-CM | POA: Diagnosis not present

## 2017-05-05 DIAGNOSIS — S72002D Fracture of unspecified part of neck of left femur, subsequent encounter for closed fracture with routine healing: Secondary | ICD-10-CM | POA: Diagnosis not present

## 2017-05-05 DIAGNOSIS — B351 Tinea unguium: Secondary | ICD-10-CM | POA: Diagnosis not present

## 2017-05-05 DIAGNOSIS — R278 Other lack of coordination: Secondary | ICD-10-CM | POA: Diagnosis not present

## 2017-05-06 DIAGNOSIS — S72002D Fracture of unspecified part of neck of left femur, subsequent encounter for closed fracture with routine healing: Secondary | ICD-10-CM | POA: Diagnosis not present

## 2017-05-06 DIAGNOSIS — M6281 Muscle weakness (generalized): Secondary | ICD-10-CM | POA: Diagnosis not present

## 2017-05-06 DIAGNOSIS — M25552 Pain in left hip: Secondary | ICD-10-CM | POA: Diagnosis not present

## 2017-05-06 DIAGNOSIS — R1312 Dysphagia, oropharyngeal phase: Secondary | ICD-10-CM | POA: Diagnosis not present

## 2017-05-06 DIAGNOSIS — R278 Other lack of coordination: Secondary | ICD-10-CM | POA: Diagnosis not present

## 2017-05-06 DIAGNOSIS — R262 Difficulty in walking, not elsewhere classified: Secondary | ICD-10-CM | POA: Diagnosis not present

## 2017-05-07 DIAGNOSIS — M25552 Pain in left hip: Secondary | ICD-10-CM | POA: Diagnosis not present

## 2017-05-07 DIAGNOSIS — R262 Difficulty in walking, not elsewhere classified: Secondary | ICD-10-CM | POA: Diagnosis not present

## 2017-05-07 DIAGNOSIS — S72002D Fracture of unspecified part of neck of left femur, subsequent encounter for closed fracture with routine healing: Secondary | ICD-10-CM | POA: Diagnosis not present

## 2017-05-07 DIAGNOSIS — M6281 Muscle weakness (generalized): Secondary | ICD-10-CM | POA: Diagnosis not present

## 2017-05-07 DIAGNOSIS — R278 Other lack of coordination: Secondary | ICD-10-CM | POA: Diagnosis not present

## 2017-05-07 DIAGNOSIS — R1312 Dysphagia, oropharyngeal phase: Secondary | ICD-10-CM | POA: Diagnosis not present

## 2017-05-08 DIAGNOSIS — R1312 Dysphagia, oropharyngeal phase: Secondary | ICD-10-CM | POA: Diagnosis not present

## 2017-05-08 DIAGNOSIS — R262 Difficulty in walking, not elsewhere classified: Secondary | ICD-10-CM | POA: Diagnosis not present

## 2017-05-08 DIAGNOSIS — M6281 Muscle weakness (generalized): Secondary | ICD-10-CM | POA: Diagnosis not present

## 2017-05-08 DIAGNOSIS — R278 Other lack of coordination: Secondary | ICD-10-CM | POA: Diagnosis not present

## 2017-05-08 DIAGNOSIS — M25552 Pain in left hip: Secondary | ICD-10-CM | POA: Diagnosis not present

## 2017-05-08 DIAGNOSIS — S72002D Fracture of unspecified part of neck of left femur, subsequent encounter for closed fracture with routine healing: Secondary | ICD-10-CM | POA: Diagnosis not present

## 2017-05-11 DIAGNOSIS — M25552 Pain in left hip: Secondary | ICD-10-CM | POA: Diagnosis not present

## 2017-05-11 DIAGNOSIS — R278 Other lack of coordination: Secondary | ICD-10-CM | POA: Diagnosis not present

## 2017-05-11 DIAGNOSIS — R262 Difficulty in walking, not elsewhere classified: Secondary | ICD-10-CM | POA: Diagnosis not present

## 2017-05-11 DIAGNOSIS — S72002D Fracture of unspecified part of neck of left femur, subsequent encounter for closed fracture with routine healing: Secondary | ICD-10-CM | POA: Diagnosis not present

## 2017-05-11 DIAGNOSIS — R1312 Dysphagia, oropharyngeal phase: Secondary | ICD-10-CM | POA: Diagnosis not present

## 2017-05-11 DIAGNOSIS — M6281 Muscle weakness (generalized): Secondary | ICD-10-CM | POA: Diagnosis not present

## 2017-05-12 DIAGNOSIS — R278 Other lack of coordination: Secondary | ICD-10-CM | POA: Diagnosis not present

## 2017-05-12 DIAGNOSIS — R1312 Dysphagia, oropharyngeal phase: Secondary | ICD-10-CM | POA: Diagnosis not present

## 2017-05-12 DIAGNOSIS — M25552 Pain in left hip: Secondary | ICD-10-CM | POA: Diagnosis not present

## 2017-05-12 DIAGNOSIS — R262 Difficulty in walking, not elsewhere classified: Secondary | ICD-10-CM | POA: Diagnosis not present

## 2017-05-12 DIAGNOSIS — M6281 Muscle weakness (generalized): Secondary | ICD-10-CM | POA: Diagnosis not present

## 2017-05-12 DIAGNOSIS — S72002D Fracture of unspecified part of neck of left femur, subsequent encounter for closed fracture with routine healing: Secondary | ICD-10-CM | POA: Diagnosis not present

## 2017-05-13 DIAGNOSIS — M6281 Muscle weakness (generalized): Secondary | ICD-10-CM | POA: Diagnosis not present

## 2017-05-13 DIAGNOSIS — R278 Other lack of coordination: Secondary | ICD-10-CM | POA: Diagnosis not present

## 2017-05-13 DIAGNOSIS — R262 Difficulty in walking, not elsewhere classified: Secondary | ICD-10-CM | POA: Diagnosis not present

## 2017-05-13 DIAGNOSIS — M25552 Pain in left hip: Secondary | ICD-10-CM | POA: Diagnosis not present

## 2017-05-13 DIAGNOSIS — S72002D Fracture of unspecified part of neck of left femur, subsequent encounter for closed fracture with routine healing: Secondary | ICD-10-CM | POA: Diagnosis not present

## 2017-05-13 DIAGNOSIS — R1312 Dysphagia, oropharyngeal phase: Secondary | ICD-10-CM | POA: Diagnosis not present

## 2017-05-14 DIAGNOSIS — R262 Difficulty in walking, not elsewhere classified: Secondary | ICD-10-CM | POA: Diagnosis not present

## 2017-05-14 DIAGNOSIS — M25552 Pain in left hip: Secondary | ICD-10-CM | POA: Diagnosis not present

## 2017-05-14 DIAGNOSIS — M6281 Muscle weakness (generalized): Secondary | ICD-10-CM | POA: Diagnosis not present

## 2017-05-14 DIAGNOSIS — R1312 Dysphagia, oropharyngeal phase: Secondary | ICD-10-CM | POA: Diagnosis not present

## 2017-05-14 DIAGNOSIS — S72002D Fracture of unspecified part of neck of left femur, subsequent encounter for closed fracture with routine healing: Secondary | ICD-10-CM | POA: Diagnosis not present

## 2017-05-14 DIAGNOSIS — R278 Other lack of coordination: Secondary | ICD-10-CM | POA: Diagnosis not present

## 2017-05-19 DIAGNOSIS — Z888 Allergy status to other drugs, medicaments and biological substances status: Secondary | ICD-10-CM | POA: Diagnosis not present

## 2017-05-19 DIAGNOSIS — Z471 Aftercare following joint replacement surgery: Secondary | ICD-10-CM | POA: Diagnosis not present

## 2017-05-19 DIAGNOSIS — Z96642 Presence of left artificial hip joint: Secondary | ICD-10-CM | POA: Diagnosis not present

## 2017-05-19 DIAGNOSIS — M80052D Age-related osteoporosis with current pathological fracture, left femur, subsequent encounter for fracture with routine healing: Secondary | ICD-10-CM | POA: Diagnosis not present

## 2017-05-20 DIAGNOSIS — R208 Other disturbances of skin sensation: Secondary | ICD-10-CM | POA: Diagnosis not present

## 2017-05-27 DIAGNOSIS — M1990 Unspecified osteoarthritis, unspecified site: Secondary | ICD-10-CM | POA: Diagnosis not present

## 2017-05-27 DIAGNOSIS — I70262 Atherosclerosis of native arteries of extremities with gangrene, left leg: Secondary | ICD-10-CM | POA: Diagnosis not present

## 2017-05-27 DIAGNOSIS — E1142 Type 2 diabetes mellitus with diabetic polyneuropathy: Secondary | ICD-10-CM | POA: Diagnosis not present

## 2017-05-27 DIAGNOSIS — I10 Essential (primary) hypertension: Secondary | ICD-10-CM | POA: Diagnosis not present

## 2017-06-03 DIAGNOSIS — E119 Type 2 diabetes mellitus without complications: Secondary | ICD-10-CM | POA: Diagnosis not present

## 2017-06-04 DIAGNOSIS — R278 Other lack of coordination: Secondary | ICD-10-CM | POA: Diagnosis not present

## 2017-06-04 DIAGNOSIS — R262 Difficulty in walking, not elsewhere classified: Secondary | ICD-10-CM | POA: Diagnosis not present

## 2017-06-04 DIAGNOSIS — S72002D Fracture of unspecified part of neck of left femur, subsequent encounter for closed fracture with routine healing: Secondary | ICD-10-CM | POA: Diagnosis not present

## 2017-06-04 DIAGNOSIS — R1312 Dysphagia, oropharyngeal phase: Secondary | ICD-10-CM | POA: Diagnosis not present

## 2017-06-04 DIAGNOSIS — M6281 Muscle weakness (generalized): Secondary | ICD-10-CM | POA: Diagnosis not present

## 2017-06-04 DIAGNOSIS — M25552 Pain in left hip: Secondary | ICD-10-CM | POA: Diagnosis not present

## 2017-06-09 DIAGNOSIS — M6281 Muscle weakness (generalized): Secondary | ICD-10-CM | POA: Diagnosis not present

## 2017-06-09 DIAGNOSIS — R1312 Dysphagia, oropharyngeal phase: Secondary | ICD-10-CM | POA: Diagnosis not present

## 2017-06-09 DIAGNOSIS — R278 Other lack of coordination: Secondary | ICD-10-CM | POA: Diagnosis not present

## 2017-06-09 DIAGNOSIS — M25552 Pain in left hip: Secondary | ICD-10-CM | POA: Diagnosis not present

## 2017-06-09 DIAGNOSIS — S72002D Fracture of unspecified part of neck of left femur, subsequent encounter for closed fracture with routine healing: Secondary | ICD-10-CM | POA: Diagnosis not present

## 2017-06-09 DIAGNOSIS — R262 Difficulty in walking, not elsewhere classified: Secondary | ICD-10-CM | POA: Diagnosis not present

## 2017-06-10 DIAGNOSIS — R262 Difficulty in walking, not elsewhere classified: Secondary | ICD-10-CM | POA: Diagnosis not present

## 2017-06-10 DIAGNOSIS — R1312 Dysphagia, oropharyngeal phase: Secondary | ICD-10-CM | POA: Diagnosis not present

## 2017-06-10 DIAGNOSIS — M6281 Muscle weakness (generalized): Secondary | ICD-10-CM | POA: Diagnosis not present

## 2017-06-10 DIAGNOSIS — S72002D Fracture of unspecified part of neck of left femur, subsequent encounter for closed fracture with routine healing: Secondary | ICD-10-CM | POA: Diagnosis not present

## 2017-06-10 DIAGNOSIS — M25552 Pain in left hip: Secondary | ICD-10-CM | POA: Diagnosis not present

## 2017-06-10 DIAGNOSIS — R278 Other lack of coordination: Secondary | ICD-10-CM | POA: Diagnosis not present

## 2017-06-11 DIAGNOSIS — R262 Difficulty in walking, not elsewhere classified: Secondary | ICD-10-CM | POA: Diagnosis not present

## 2017-06-11 DIAGNOSIS — M6281 Muscle weakness (generalized): Secondary | ICD-10-CM | POA: Diagnosis not present

## 2017-06-11 DIAGNOSIS — R1312 Dysphagia, oropharyngeal phase: Secondary | ICD-10-CM | POA: Diagnosis not present

## 2017-06-11 DIAGNOSIS — S72002D Fracture of unspecified part of neck of left femur, subsequent encounter for closed fracture with routine healing: Secondary | ICD-10-CM | POA: Diagnosis not present

## 2017-06-11 DIAGNOSIS — M25552 Pain in left hip: Secondary | ICD-10-CM | POA: Diagnosis not present

## 2017-06-11 DIAGNOSIS — R278 Other lack of coordination: Secondary | ICD-10-CM | POA: Diagnosis not present

## 2017-06-12 DIAGNOSIS — R262 Difficulty in walking, not elsewhere classified: Secondary | ICD-10-CM | POA: Diagnosis not present

## 2017-06-12 DIAGNOSIS — S72002D Fracture of unspecified part of neck of left femur, subsequent encounter for closed fracture with routine healing: Secondary | ICD-10-CM | POA: Diagnosis not present

## 2017-06-12 DIAGNOSIS — R1312 Dysphagia, oropharyngeal phase: Secondary | ICD-10-CM | POA: Diagnosis not present

## 2017-06-12 DIAGNOSIS — R278 Other lack of coordination: Secondary | ICD-10-CM | POA: Diagnosis not present

## 2017-06-12 DIAGNOSIS — M6281 Muscle weakness (generalized): Secondary | ICD-10-CM | POA: Diagnosis not present

## 2017-06-12 DIAGNOSIS — M25552 Pain in left hip: Secondary | ICD-10-CM | POA: Diagnosis not present

## 2017-06-15 DIAGNOSIS — M25552 Pain in left hip: Secondary | ICD-10-CM | POA: Diagnosis not present

## 2017-06-15 DIAGNOSIS — R262 Difficulty in walking, not elsewhere classified: Secondary | ICD-10-CM | POA: Diagnosis not present

## 2017-06-15 DIAGNOSIS — R1312 Dysphagia, oropharyngeal phase: Secondary | ICD-10-CM | POA: Diagnosis not present

## 2017-06-15 DIAGNOSIS — R278 Other lack of coordination: Secondary | ICD-10-CM | POA: Diagnosis not present

## 2017-06-15 DIAGNOSIS — S72002D Fracture of unspecified part of neck of left femur, subsequent encounter for closed fracture with routine healing: Secondary | ICD-10-CM | POA: Diagnosis not present

## 2017-06-15 DIAGNOSIS — M6281 Muscle weakness (generalized): Secondary | ICD-10-CM | POA: Diagnosis not present

## 2017-06-16 DIAGNOSIS — R262 Difficulty in walking, not elsewhere classified: Secondary | ICD-10-CM | POA: Diagnosis not present

## 2017-06-16 DIAGNOSIS — M6281 Muscle weakness (generalized): Secondary | ICD-10-CM | POA: Diagnosis not present

## 2017-06-16 DIAGNOSIS — R1312 Dysphagia, oropharyngeal phase: Secondary | ICD-10-CM | POA: Diagnosis not present

## 2017-06-16 DIAGNOSIS — S72002D Fracture of unspecified part of neck of left femur, subsequent encounter for closed fracture with routine healing: Secondary | ICD-10-CM | POA: Diagnosis not present

## 2017-06-16 DIAGNOSIS — R278 Other lack of coordination: Secondary | ICD-10-CM | POA: Diagnosis not present

## 2017-06-16 DIAGNOSIS — M25552 Pain in left hip: Secondary | ICD-10-CM | POA: Diagnosis not present

## 2017-06-17 DIAGNOSIS — M25552 Pain in left hip: Secondary | ICD-10-CM | POA: Diagnosis not present

## 2017-06-17 DIAGNOSIS — M6281 Muscle weakness (generalized): Secondary | ICD-10-CM | POA: Diagnosis not present

## 2017-06-17 DIAGNOSIS — S72002D Fracture of unspecified part of neck of left femur, subsequent encounter for closed fracture with routine healing: Secondary | ICD-10-CM | POA: Diagnosis not present

## 2017-06-17 DIAGNOSIS — R278 Other lack of coordination: Secondary | ICD-10-CM | POA: Diagnosis not present

## 2017-06-17 DIAGNOSIS — R1312 Dysphagia, oropharyngeal phase: Secondary | ICD-10-CM | POA: Diagnosis not present

## 2017-06-17 DIAGNOSIS — R262 Difficulty in walking, not elsewhere classified: Secondary | ICD-10-CM | POA: Diagnosis not present

## 2017-06-18 DIAGNOSIS — M6281 Muscle weakness (generalized): Secondary | ICD-10-CM | POA: Diagnosis not present

## 2017-06-18 DIAGNOSIS — S72002D Fracture of unspecified part of neck of left femur, subsequent encounter for closed fracture with routine healing: Secondary | ICD-10-CM | POA: Diagnosis not present

## 2017-06-18 DIAGNOSIS — R1312 Dysphagia, oropharyngeal phase: Secondary | ICD-10-CM | POA: Diagnosis not present

## 2017-06-18 DIAGNOSIS — M25552 Pain in left hip: Secondary | ICD-10-CM | POA: Diagnosis not present

## 2017-06-18 DIAGNOSIS — R262 Difficulty in walking, not elsewhere classified: Secondary | ICD-10-CM | POA: Diagnosis not present

## 2017-06-18 DIAGNOSIS — R278 Other lack of coordination: Secondary | ICD-10-CM | POA: Diagnosis not present

## 2017-06-19 DIAGNOSIS — M25552 Pain in left hip: Secondary | ICD-10-CM | POA: Diagnosis not present

## 2017-06-19 DIAGNOSIS — R278 Other lack of coordination: Secondary | ICD-10-CM | POA: Diagnosis not present

## 2017-06-19 DIAGNOSIS — R1312 Dysphagia, oropharyngeal phase: Secondary | ICD-10-CM | POA: Diagnosis not present

## 2017-06-19 DIAGNOSIS — S72002D Fracture of unspecified part of neck of left femur, subsequent encounter for closed fracture with routine healing: Secondary | ICD-10-CM | POA: Diagnosis not present

## 2017-06-19 DIAGNOSIS — R262 Difficulty in walking, not elsewhere classified: Secondary | ICD-10-CM | POA: Diagnosis not present

## 2017-06-19 DIAGNOSIS — M6281 Muscle weakness (generalized): Secondary | ICD-10-CM | POA: Diagnosis not present

## 2017-06-22 DIAGNOSIS — R278 Other lack of coordination: Secondary | ICD-10-CM | POA: Diagnosis not present

## 2017-06-22 DIAGNOSIS — M25552 Pain in left hip: Secondary | ICD-10-CM | POA: Diagnosis not present

## 2017-06-22 DIAGNOSIS — R1312 Dysphagia, oropharyngeal phase: Secondary | ICD-10-CM | POA: Diagnosis not present

## 2017-06-22 DIAGNOSIS — S72002D Fracture of unspecified part of neck of left femur, subsequent encounter for closed fracture with routine healing: Secondary | ICD-10-CM | POA: Diagnosis not present

## 2017-06-22 DIAGNOSIS — R262 Difficulty in walking, not elsewhere classified: Secondary | ICD-10-CM | POA: Diagnosis not present

## 2017-06-22 DIAGNOSIS — M6281 Muscle weakness (generalized): Secondary | ICD-10-CM | POA: Diagnosis not present

## 2017-06-23 DIAGNOSIS — R262 Difficulty in walking, not elsewhere classified: Secondary | ICD-10-CM | POA: Diagnosis not present

## 2017-06-23 DIAGNOSIS — M25552 Pain in left hip: Secondary | ICD-10-CM | POA: Diagnosis not present

## 2017-06-23 DIAGNOSIS — R1312 Dysphagia, oropharyngeal phase: Secondary | ICD-10-CM | POA: Diagnosis not present

## 2017-06-23 DIAGNOSIS — R278 Other lack of coordination: Secondary | ICD-10-CM | POA: Diagnosis not present

## 2017-06-23 DIAGNOSIS — S72002D Fracture of unspecified part of neck of left femur, subsequent encounter for closed fracture with routine healing: Secondary | ICD-10-CM | POA: Diagnosis not present

## 2017-06-23 DIAGNOSIS — M6281 Muscle weakness (generalized): Secondary | ICD-10-CM | POA: Diagnosis not present

## 2017-06-24 DIAGNOSIS — R278 Other lack of coordination: Secondary | ICD-10-CM | POA: Diagnosis not present

## 2017-06-24 DIAGNOSIS — D508 Other iron deficiency anemias: Secondary | ICD-10-CM | POA: Diagnosis not present

## 2017-06-24 DIAGNOSIS — S72002D Fracture of unspecified part of neck of left femur, subsequent encounter for closed fracture with routine healing: Secondary | ICD-10-CM | POA: Diagnosis not present

## 2017-06-24 DIAGNOSIS — R1312 Dysphagia, oropharyngeal phase: Secondary | ICD-10-CM | POA: Diagnosis not present

## 2017-06-24 DIAGNOSIS — M6281 Muscle weakness (generalized): Secondary | ICD-10-CM | POA: Diagnosis not present

## 2017-06-24 DIAGNOSIS — M069 Rheumatoid arthritis, unspecified: Secondary | ICD-10-CM | POA: Diagnosis not present

## 2017-06-24 DIAGNOSIS — M25552 Pain in left hip: Secondary | ICD-10-CM | POA: Diagnosis not present

## 2017-06-24 DIAGNOSIS — M1990 Unspecified osteoarthritis, unspecified site: Secondary | ICD-10-CM | POA: Diagnosis not present

## 2017-06-24 DIAGNOSIS — E1142 Type 2 diabetes mellitus with diabetic polyneuropathy: Secondary | ICD-10-CM | POA: Diagnosis not present

## 2017-06-24 DIAGNOSIS — R262 Difficulty in walking, not elsewhere classified: Secondary | ICD-10-CM | POA: Diagnosis not present

## 2017-06-25 DIAGNOSIS — R1312 Dysphagia, oropharyngeal phase: Secondary | ICD-10-CM | POA: Diagnosis not present

## 2017-06-25 DIAGNOSIS — R278 Other lack of coordination: Secondary | ICD-10-CM | POA: Diagnosis not present

## 2017-06-25 DIAGNOSIS — S72002D Fracture of unspecified part of neck of left femur, subsequent encounter for closed fracture with routine healing: Secondary | ICD-10-CM | POA: Diagnosis not present

## 2017-06-25 DIAGNOSIS — M6281 Muscle weakness (generalized): Secondary | ICD-10-CM | POA: Diagnosis not present

## 2017-06-25 DIAGNOSIS — R262 Difficulty in walking, not elsewhere classified: Secondary | ICD-10-CM | POA: Diagnosis not present

## 2017-06-25 DIAGNOSIS — M25552 Pain in left hip: Secondary | ICD-10-CM | POA: Diagnosis not present

## 2017-06-26 DIAGNOSIS — S72002D Fracture of unspecified part of neck of left femur, subsequent encounter for closed fracture with routine healing: Secondary | ICD-10-CM | POA: Diagnosis not present

## 2017-06-26 DIAGNOSIS — M6281 Muscle weakness (generalized): Secondary | ICD-10-CM | POA: Diagnosis not present

## 2017-06-26 DIAGNOSIS — R1312 Dysphagia, oropharyngeal phase: Secondary | ICD-10-CM | POA: Diagnosis not present

## 2017-06-26 DIAGNOSIS — M25552 Pain in left hip: Secondary | ICD-10-CM | POA: Diagnosis not present

## 2017-06-26 DIAGNOSIS — R278 Other lack of coordination: Secondary | ICD-10-CM | POA: Diagnosis not present

## 2017-06-26 DIAGNOSIS — R262 Difficulty in walking, not elsewhere classified: Secondary | ICD-10-CM | POA: Diagnosis not present

## 2017-06-29 DIAGNOSIS — R262 Difficulty in walking, not elsewhere classified: Secondary | ICD-10-CM | POA: Diagnosis not present

## 2017-06-29 DIAGNOSIS — M6281 Muscle weakness (generalized): Secondary | ICD-10-CM | POA: Diagnosis not present

## 2017-06-29 DIAGNOSIS — E785 Hyperlipidemia, unspecified: Secondary | ICD-10-CM | POA: Diagnosis not present

## 2017-06-29 DIAGNOSIS — R569 Unspecified convulsions: Secondary | ICD-10-CM | POA: Diagnosis not present

## 2017-06-29 DIAGNOSIS — I10 Essential (primary) hypertension: Secondary | ICD-10-CM | POA: Diagnosis not present

## 2017-06-29 DIAGNOSIS — S72002D Fracture of unspecified part of neck of left femur, subsequent encounter for closed fracture with routine healing: Secondary | ICD-10-CM | POA: Diagnosis not present

## 2017-06-29 DIAGNOSIS — D649 Anemia, unspecified: Secondary | ICD-10-CM | POA: Diagnosis not present

## 2017-06-29 DIAGNOSIS — Z5181 Encounter for therapeutic drug level monitoring: Secondary | ICD-10-CM | POA: Diagnosis not present

## 2017-06-29 DIAGNOSIS — R278 Other lack of coordination: Secondary | ICD-10-CM | POA: Diagnosis not present

## 2017-06-29 DIAGNOSIS — R1312 Dysphagia, oropharyngeal phase: Secondary | ICD-10-CM | POA: Diagnosis not present

## 2017-06-29 DIAGNOSIS — M25552 Pain in left hip: Secondary | ICD-10-CM | POA: Diagnosis not present

## 2017-06-30 DIAGNOSIS — R278 Other lack of coordination: Secondary | ICD-10-CM | POA: Diagnosis not present

## 2017-06-30 DIAGNOSIS — E785 Hyperlipidemia, unspecified: Secondary | ICD-10-CM | POA: Diagnosis not present

## 2017-06-30 DIAGNOSIS — F2089 Other schizophrenia: Secondary | ICD-10-CM | POA: Diagnosis not present

## 2017-06-30 DIAGNOSIS — R262 Difficulty in walking, not elsewhere classified: Secondary | ICD-10-CM | POA: Diagnosis not present

## 2017-06-30 DIAGNOSIS — M6281 Muscle weakness (generalized): Secondary | ICD-10-CM | POA: Diagnosis not present

## 2017-06-30 DIAGNOSIS — F333 Major depressive disorder, recurrent, severe with psychotic symptoms: Secondary | ICD-10-CM | POA: Diagnosis not present

## 2017-06-30 DIAGNOSIS — F319 Bipolar disorder, unspecified: Secondary | ICD-10-CM | POA: Diagnosis not present

## 2017-06-30 DIAGNOSIS — F0390 Unspecified dementia without behavioral disturbance: Secondary | ICD-10-CM | POA: Diagnosis not present

## 2017-06-30 DIAGNOSIS — I10 Essential (primary) hypertension: Secondary | ICD-10-CM | POA: Diagnosis not present

## 2017-06-30 DIAGNOSIS — R1312 Dysphagia, oropharyngeal phase: Secondary | ICD-10-CM | POA: Diagnosis not present

## 2017-06-30 DIAGNOSIS — S72002D Fracture of unspecified part of neck of left femur, subsequent encounter for closed fracture with routine healing: Secondary | ICD-10-CM | POA: Diagnosis not present

## 2017-06-30 DIAGNOSIS — M25552 Pain in left hip: Secondary | ICD-10-CM | POA: Diagnosis not present

## 2017-07-01 DIAGNOSIS — R1312 Dysphagia, oropharyngeal phase: Secondary | ICD-10-CM | POA: Diagnosis not present

## 2017-07-01 DIAGNOSIS — S72002D Fracture of unspecified part of neck of left femur, subsequent encounter for closed fracture with routine healing: Secondary | ICD-10-CM | POA: Diagnosis not present

## 2017-07-01 DIAGNOSIS — M6281 Muscle weakness (generalized): Secondary | ICD-10-CM | POA: Diagnosis not present

## 2017-07-01 DIAGNOSIS — R262 Difficulty in walking, not elsewhere classified: Secondary | ICD-10-CM | POA: Diagnosis not present

## 2017-07-01 DIAGNOSIS — M25552 Pain in left hip: Secondary | ICD-10-CM | POA: Diagnosis not present

## 2017-07-01 DIAGNOSIS — R278 Other lack of coordination: Secondary | ICD-10-CM | POA: Diagnosis not present

## 2017-07-02 DIAGNOSIS — S72002D Fracture of unspecified part of neck of left femur, subsequent encounter for closed fracture with routine healing: Secondary | ICD-10-CM | POA: Diagnosis not present

## 2017-07-02 DIAGNOSIS — R262 Difficulty in walking, not elsewhere classified: Secondary | ICD-10-CM | POA: Diagnosis not present

## 2017-07-02 DIAGNOSIS — M6281 Muscle weakness (generalized): Secondary | ICD-10-CM | POA: Diagnosis not present

## 2017-07-02 DIAGNOSIS — R278 Other lack of coordination: Secondary | ICD-10-CM | POA: Diagnosis not present

## 2017-07-02 DIAGNOSIS — M25552 Pain in left hip: Secondary | ICD-10-CM | POA: Diagnosis not present

## 2017-07-02 DIAGNOSIS — R1312 Dysphagia, oropharyngeal phase: Secondary | ICD-10-CM | POA: Diagnosis not present

## 2017-07-03 DIAGNOSIS — R1312 Dysphagia, oropharyngeal phase: Secondary | ICD-10-CM | POA: Diagnosis not present

## 2017-07-03 DIAGNOSIS — S72002D Fracture of unspecified part of neck of left femur, subsequent encounter for closed fracture with routine healing: Secondary | ICD-10-CM | POA: Diagnosis not present

## 2017-07-03 DIAGNOSIS — M6281 Muscle weakness (generalized): Secondary | ICD-10-CM | POA: Diagnosis not present

## 2017-07-03 DIAGNOSIS — R278 Other lack of coordination: Secondary | ICD-10-CM | POA: Diagnosis not present

## 2017-07-03 DIAGNOSIS — M25552 Pain in left hip: Secondary | ICD-10-CM | POA: Diagnosis not present

## 2017-07-03 DIAGNOSIS — R262 Difficulty in walking, not elsewhere classified: Secondary | ICD-10-CM | POA: Diagnosis not present

## 2017-07-06 DIAGNOSIS — R278 Other lack of coordination: Secondary | ICD-10-CM | POA: Diagnosis not present

## 2017-07-06 DIAGNOSIS — M25552 Pain in left hip: Secondary | ICD-10-CM | POA: Diagnosis not present

## 2017-07-06 DIAGNOSIS — M6281 Muscle weakness (generalized): Secondary | ICD-10-CM | POA: Diagnosis not present

## 2017-07-06 DIAGNOSIS — R262 Difficulty in walking, not elsewhere classified: Secondary | ICD-10-CM | POA: Diagnosis not present

## 2017-07-06 DIAGNOSIS — S72002D Fracture of unspecified part of neck of left femur, subsequent encounter for closed fracture with routine healing: Secondary | ICD-10-CM | POA: Diagnosis not present

## 2017-07-06 DIAGNOSIS — R1312 Dysphagia, oropharyngeal phase: Secondary | ICD-10-CM | POA: Diagnosis not present

## 2017-07-07 DIAGNOSIS — R1312 Dysphagia, oropharyngeal phase: Secondary | ICD-10-CM | POA: Diagnosis not present

## 2017-07-07 DIAGNOSIS — R262 Difficulty in walking, not elsewhere classified: Secondary | ICD-10-CM | POA: Diagnosis not present

## 2017-07-07 DIAGNOSIS — R278 Other lack of coordination: Secondary | ICD-10-CM | POA: Diagnosis not present

## 2017-07-07 DIAGNOSIS — S72002D Fracture of unspecified part of neck of left femur, subsequent encounter for closed fracture with routine healing: Secondary | ICD-10-CM | POA: Diagnosis not present

## 2017-07-07 DIAGNOSIS — M25552 Pain in left hip: Secondary | ICD-10-CM | POA: Diagnosis not present

## 2017-07-07 DIAGNOSIS — M6281 Muscle weakness (generalized): Secondary | ICD-10-CM | POA: Diagnosis not present

## 2017-07-08 DIAGNOSIS — R278 Other lack of coordination: Secondary | ICD-10-CM | POA: Diagnosis not present

## 2017-07-08 DIAGNOSIS — R262 Difficulty in walking, not elsewhere classified: Secondary | ICD-10-CM | POA: Diagnosis not present

## 2017-07-08 DIAGNOSIS — S72002D Fracture of unspecified part of neck of left femur, subsequent encounter for closed fracture with routine healing: Secondary | ICD-10-CM | POA: Diagnosis not present

## 2017-07-08 DIAGNOSIS — M25552 Pain in left hip: Secondary | ICD-10-CM | POA: Diagnosis not present

## 2017-07-08 DIAGNOSIS — M6281 Muscle weakness (generalized): Secondary | ICD-10-CM | POA: Diagnosis not present

## 2017-07-08 DIAGNOSIS — R1312 Dysphagia, oropharyngeal phase: Secondary | ICD-10-CM | POA: Diagnosis not present

## 2017-07-14 DIAGNOSIS — F333 Major depressive disorder, recurrent, severe with psychotic symptoms: Secondary | ICD-10-CM | POA: Diagnosis not present

## 2017-07-14 DIAGNOSIS — F0390 Unspecified dementia without behavioral disturbance: Secondary | ICD-10-CM | POA: Diagnosis not present

## 2017-07-15 ENCOUNTER — Ambulatory Visit (INDEPENDENT_AMBULATORY_CARE_PROVIDER_SITE_OTHER)
Admission: RE | Admit: 2017-07-15 | Discharge: 2017-07-15 | Disposition: A | Discharge status: Home / Self Care | Payer: Medicare Other | Source: Ambulatory Visit | Attending: Vascular Surgery | Admitting: Vascular Surgery

## 2017-07-15 ENCOUNTER — Ambulatory Visit (HOSPITAL_COMMUNITY)
Admission: RE | Admit: 2017-07-15 | Discharge: 2017-07-15 | Disposition: A | Discharge status: Home / Self Care | Payer: Medicare Other | Source: Ambulatory Visit | Attending: Vascular Surgery | Admitting: Vascular Surgery

## 2017-07-15 ENCOUNTER — Other Ambulatory Visit: Payer: Self-pay

## 2017-07-15 ENCOUNTER — Encounter: Payer: Self-pay | Admitting: Vascular Surgery

## 2017-07-15 ENCOUNTER — Ambulatory Visit (INDEPENDENT_AMBULATORY_CARE_PROVIDER_SITE_OTHER): Payer: Medicare Other | Admitting: Vascular Surgery

## 2017-07-15 VITALS — BP 154/72 | HR 55 | Temp 97.4°F | Resp 20 | Ht 73.0 in | Wt 175.0 lb

## 2017-07-15 DIAGNOSIS — I739 Peripheral vascular disease, unspecified: Secondary | ICD-10-CM

## 2017-07-15 DIAGNOSIS — I70245 Atherosclerosis of native arteries of left leg with ulceration of other part of foot: Secondary | ICD-10-CM | POA: Diagnosis not present

## 2017-07-15 NOTE — Progress Notes (Signed)
Patient name: Terry Green MRN: [id number] DOB: 01/30/1941 Sex: male  REASON FOR VISIT:   Follow-up of peripheral vascular disease.  HPI:   Terry Green is a pleasant 77 y.o. male who was seen by a physician's assistant on 04/08/2017.  The patient underwent a left popliteal to peroneal artery bypass with vein and a left great toe amputation on 08/19/2016.  The patient has a right below the knee amputation.  ABI on the left with 83% last visit.  The patient also had a heel wound which was healing at that point.  There were some mildly elevated velocities in the proximal bypass graft.  He had biphasic Doppler signals in the left peroneal artery.  Comes in for 74-month follow-up visit.  Since I saw him last he has been doing well and denies any significant pain in the left leg.  He denies claudication or rest pain.  He does ambulate some with his prosthesis.  He has a wound over the left third toe at the tip and also over the toe amputation site at the first and second toe.  Dressing changes are being done at the skilled nursing facility.  Past Medical History:  Diagnosis Date  . Abnormality of gait and mobility   . Anemia    iron deficiency  . Anxiety   . Arthritis    "hands" (06/06/2014)  . Bipolar disorder (HCC)   . BPH (benign prostatic hyperplasia)   . Coronary artery disease   . Dementia   . Depression   . 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 Generalized weakness   . Hypercholesteremia   . Hypertension   . PAD (peripheral artery disease) (HCC)   . Peripheral vascular disease (HCC)   . Psychosis (HCC)   . Rheumatoid arthritis (HCC)   . Type II diabetes mellitus (HCC)    Type 2    Family History  Problem Relation Age of Onset  . Heart disease Father     SOCIAL HISTORY: Social History   Tobacco Use  . Smoking status: Former Smoker    Packs/day: 1.00    Years: 54.00    Pack years: 54.00    Types: Cigarettes    Last attempt to quit: 05/25/2012   Years since quitting: 5.1  . Smokeless tobacco: Never Used  Substance Use Topics  . Alcohol use: Yes    Alcohol/week: 0.0 oz    Comment: 06/06/2014 "I drank 1/2 pint whiskey each day;  nothing in 20 yrs"    Allergies  Allergen Reactions  . Lisinopril Swelling    SWELLING REACTION UNSPECIFIED     Current Outpatient Medications  Medication Sig Dispense Refill  . acetaminophen (TYLENOL) 500 MG tablet Take 1,000 mg by mouth every 8 (eight) hours as needed for moderate pain.    Marland Kitchen alum & mag hydroxide-simeth (MYLANTA) 200-200-20 MG/5ML suspension Take 15 mLs by mouth every 6 (six) hours as needed for indigestion or heartburn.    Marland Kitchen amLODipine (NORVASC) 5 MG tablet Take 5 mg by mouth daily.    Marland Kitchen aspirin EC 81 MG tablet Take 81 mg by mouth daily.    Marland Kitchen atorvastatin (LIPITOR) 10 MG tablet Take 1 tablet (10 mg total) by mouth daily. 30 tablet 11  . Benzocaine-Menthol (CHLORASEPTIC SORE THROAT MT) Use as directed 1 spray in the mouth or throat every 2 (two) hours as needed (sore throat).     . busPIRone (BUSPAR) 15 MG tablet Take 15 mg by mouth every evening.    Marland Kitchen  carvedilol (COREG) 12.5 MG tablet Take 12.5 mg by mouth 2 (two) times daily with a meal.    . clopidogrel (PLAVIX) 75 MG tablet Take 75 mg by mouth daily.    . divalproex (DEPAKOTE ER) 500 MG 24 hr tablet Take 500 mg by mouth 2 (two) times daily.     Marland Kitchen gabapentin (NEURONTIN) 300 MG capsule Take 300 mg by mouth 3 (three) times daily.    Marland Kitchen guaiFENesin-dextromethorphan (ROBITUSSIN DM) 100-10 MG/5ML syrup Take 10 mLs by mouth every 4 (four) hours as needed for cough.    Marland Kitchen HYDROcodone-acetaminophen (NORCO) 7.5-325 MG tablet Take 1-2 tablets by mouth every 4 (four) hours as needed for moderate pain. 30 tablet 0  . insulin aspart (NOVOLOG) 100 UNIT/ML FlexPen Inject 0-20 Units into the skin 2 (two) times daily. 6:30am and 4pm per sliding scale:  CBG 150-249 3 units, 250-349 5 units, 350-449 8 units, 450-549 14 units, 550-551 20 units >551 call MD     . Insulin Glargine (LANTUS) 100 UNIT/ML Solostar Pen Inject 15 Units into the skin at bedtime.     . Insulin Isophane & Regular Human (HUMULIN 70/30 KWIKPEN) (70-30) 100 UNIT/ML PEN Inject into the skin.    Marland Kitchen loratadine (CLARITIN) 10 MG tablet Take 10 mg by mouth daily.    . memantine (NAMENDA) 10 MG tablet Take 10 mg by mouth 2 (two) times daily.    . metFORMIN (GLUCOPHAGE) 1000 MG tablet Take 1,000 mg by mouth 2 (two) times daily with a meal.     . Multiple Vitamins-Minerals (MULTIVITAMIN WITH MINERALS) tablet Take 1 tablet by mouth daily.    Marland Kitchen oxybutynin (DITROPAN-XL) 10 MG 24 hr tablet Take 10 mg by mouth at bedtime.    . risperiDONE (RISPERDAL) 0.5 MG tablet Take 0.5 mg by mouth at bedtime.    . sertraline (ZOLOFT) 100 MG tablet Take 100 mg by mouth daily.    . Sodium Chloride Flush (NORMAL SALINE FLUSH IV) Inject into the vein.    . tamsulosin (FLOMAX) 0.4 MG CAPS capsule Take 0.4 mg by mouth at bedtime.     . vancomycin 1,000 mg in sodium chloride 0.9 % 250 mL Inject 1,000 mg into the vein every 12 (twelve) hours. For infection to left foot. Start date: 08/10/16    . vitamin C (ASCORBIC ACID) 500 MG tablet Take 500 mg by mouth 2 (two) times daily.     No current facility-administered medications for this visit.     REVIEW OF SYSTEMS:  [X]  denotes positive finding, [ ]  denotes negative finding Cardiac  Comments:  Chest pain or chest pressure:    Shortness of breath upon exertion:    Short of breath when lying flat:    Irregular heart rhythm:        Vascular    Pain in calf, thigh, or hip brought on by ambulation:    Pain in feet at night that wakes you up from your sleep:     Blood clot in your veins:    Leg swelling:         Pulmonary    Oxygen at home:    Productive cough:     Wheezing:         Neurologic    Sudden weakness in arms or legs:     Sudden numbness in arms or legs:     Sudden onset of difficulty speaking or slurred speech:    Temporary loss of vision in  one eye:  Problems with dizziness:         Gastrointestinal    Blood in stool:     Vomited blood:         Genitourinary    Burning when urinating:     Blood in urine:        Psychiatric    Major depression:         Hematologic    Bleeding problems:    Problems with blood clotting too easily:        Skin    Rashes or ulcers:        Constitutional    Fever or chills:     PHYSICAL EXAM:   Vitals:   07/15/17 1331 07/15/17 1332  BP: (!) 160/75 (!) 154/72  Pulse: (!) 55   Resp: 20   Temp: (!) 97.4 F (36.3 C)   TempSrc: Oral   SpO2: 98%   Weight: 175 lb (79.4 kg)   Height: 6\' 1"  (1.854 m)     GENERAL: The patient is a well-nourished male, in no acute distress. The vital signs are documented above. CARDIAC: There is a regular rate and rhythm.  VASCULAR: I do not detect carotid bruits. The left foot is warm and well-perfused. He has moderate left lower extremity swelling. PULMONARY: There is good air exchange bilaterally without wheezing or rales. ABDOMEN: Soft and non-tender with normal pitched bowel sounds.  MUSCULOSKELETAL: He has a right below the knee amputation. NEUROLOGIC: No focal weakness or paresthesias are detected. SKIN: There is a superficial wound over the left first and second toe amp site and also a superficial wound on the end of the third toe which has good granulation tissue but appears well perfused. PSYCHIATRIC: The patient has a normal affect.  DATA:    LOWER EXTREMITY DUPLEX: I have been apparently interpreted the duplex of the left lower extremity bypass graft.  The graft is patent with increased velocities noted near the proximal segment of the graft which may be secondary to a change in vessel diameter.  There is biphasic flow noted in the inflow and proximal graft.  ARTERIAL DOPPLER STUDY: I have independently interpreted his arterial Doppler study today.  There are monophasic Doppler signals in the posterior tibial and dorsalis pedis  positions with an ABI of 92%.  MEDICAL ISSUES:   PERIPHERAL VASCULAR DISEASE: The patient's left popliteal to peroneal artery bypass graft is patent.  He has a small wound on the tip of his third toe which I think will heal with leg elevation and continued aggressive wound care.  His bypass graft is patent.  He has peroneal runoff only so I think his circulation is as good as we can get it.  ABI is 92% although this may be falsely elevated because of his calcific disease.  I written specific instructions for the nursing facility.  I have ordered a follow-up ABI and duplex in 6 months and I will see him back at that time.  He knows to call sooner if he has problems.  Fortunately he is not a smoker.  He is on aspirin and is on a statin.  [facility] [phone]

## 2017-07-22 DIAGNOSIS — F0390 Unspecified dementia without behavioral disturbance: Secondary | ICD-10-CM | POA: Diagnosis not present

## 2017-07-22 DIAGNOSIS — M1990 Unspecified osteoarthritis, unspecified site: Secondary | ICD-10-CM | POA: Diagnosis not present

## 2017-07-22 DIAGNOSIS — N400 Enlarged prostate without lower urinary tract symptoms: Secondary | ICD-10-CM | POA: Diagnosis not present

## 2017-07-22 DIAGNOSIS — D508 Other iron deficiency anemias: Secondary | ICD-10-CM | POA: Diagnosis not present

## 2017-07-28 DIAGNOSIS — F0390 Unspecified dementia without behavioral disturbance: Secondary | ICD-10-CM | POA: Diagnosis not present

## 2017-07-28 DIAGNOSIS — F333 Major depressive disorder, recurrent, severe with psychotic symptoms: Secondary | ICD-10-CM | POA: Diagnosis not present

## 2017-07-29 DIAGNOSIS — F0150 Vascular dementia without behavioral disturbance: Secondary | ICD-10-CM | POA: Diagnosis not present

## 2017-07-29 DIAGNOSIS — F333 Major depressive disorder, recurrent, severe with psychotic symptoms: Secondary | ICD-10-CM | POA: Diagnosis not present

## 2017-08-11 DIAGNOSIS — F0390 Unspecified dementia without behavioral disturbance: Secondary | ICD-10-CM | POA: Diagnosis not present

## 2017-08-11 DIAGNOSIS — F333 Major depressive disorder, recurrent, severe with psychotic symptoms: Secondary | ICD-10-CM | POA: Diagnosis not present

## 2017-08-13 DIAGNOSIS — Z5181 Encounter for therapeutic drug level monitoring: Secondary | ICD-10-CM | POA: Diagnosis not present

## 2017-08-13 DIAGNOSIS — R569 Unspecified convulsions: Secondary | ICD-10-CM | POA: Diagnosis not present

## 2017-08-17 DIAGNOSIS — E1142 Type 2 diabetes mellitus with diabetic polyneuropathy: Secondary | ICD-10-CM | POA: Diagnosis not present

## 2017-08-17 DIAGNOSIS — M069 Rheumatoid arthritis, unspecified: Secondary | ICD-10-CM | POA: Diagnosis not present

## 2017-08-17 DIAGNOSIS — M1990 Unspecified osteoarthritis, unspecified site: Secondary | ICD-10-CM | POA: Diagnosis not present

## 2017-08-17 DIAGNOSIS — I10 Essential (primary) hypertension: Secondary | ICD-10-CM | POA: Diagnosis not present

## 2017-08-25 DIAGNOSIS — F0390 Unspecified dementia without behavioral disturbance: Secondary | ICD-10-CM | POA: Diagnosis not present

## 2017-08-25 DIAGNOSIS — M25552 Pain in left hip: Secondary | ICD-10-CM | POA: Diagnosis not present

## 2017-08-25 DIAGNOSIS — R1312 Dysphagia, oropharyngeal phase: Secondary | ICD-10-CM | POA: Diagnosis not present

## 2017-08-25 DIAGNOSIS — M6281 Muscle weakness (generalized): Secondary | ICD-10-CM | POA: Diagnosis not present

## 2017-08-25 DIAGNOSIS — S72002D Fracture of unspecified part of neck of left femur, subsequent encounter for closed fracture with routine healing: Secondary | ICD-10-CM | POA: Diagnosis not present

## 2017-08-25 DIAGNOSIS — F333 Major depressive disorder, recurrent, severe with psychotic symptoms: Secondary | ICD-10-CM | POA: Diagnosis not present

## 2017-08-25 DIAGNOSIS — R262 Difficulty in walking, not elsewhere classified: Secondary | ICD-10-CM | POA: Diagnosis not present

## 2017-08-25 DIAGNOSIS — R278 Other lack of coordination: Secondary | ICD-10-CM | POA: Diagnosis not present

## 2017-08-27 DIAGNOSIS — M25552 Pain in left hip: Secondary | ICD-10-CM | POA: Diagnosis not present

## 2017-08-27 DIAGNOSIS — S72002D Fracture of unspecified part of neck of left femur, subsequent encounter for closed fracture with routine healing: Secondary | ICD-10-CM | POA: Diagnosis not present

## 2017-08-27 DIAGNOSIS — R1312 Dysphagia, oropharyngeal phase: Secondary | ICD-10-CM | POA: Diagnosis not present

## 2017-08-27 DIAGNOSIS — M6281 Muscle weakness (generalized): Secondary | ICD-10-CM | POA: Diagnosis not present

## 2017-08-27 DIAGNOSIS — R278 Other lack of coordination: Secondary | ICD-10-CM | POA: Diagnosis not present

## 2017-08-27 DIAGNOSIS — R262 Difficulty in walking, not elsewhere classified: Secondary | ICD-10-CM | POA: Diagnosis not present

## 2017-08-28 DIAGNOSIS — R569 Unspecified convulsions: Secondary | ICD-10-CM | POA: Diagnosis not present

## 2017-08-28 DIAGNOSIS — R1312 Dysphagia, oropharyngeal phase: Secondary | ICD-10-CM | POA: Diagnosis not present

## 2017-08-28 DIAGNOSIS — Z5181 Encounter for therapeutic drug level monitoring: Secondary | ICD-10-CM | POA: Diagnosis not present

## 2017-08-28 DIAGNOSIS — S72002D Fracture of unspecified part of neck of left femur, subsequent encounter for closed fracture with routine healing: Secondary | ICD-10-CM | POA: Diagnosis not present

## 2017-08-28 DIAGNOSIS — R262 Difficulty in walking, not elsewhere classified: Secondary | ICD-10-CM | POA: Diagnosis not present

## 2017-08-28 DIAGNOSIS — M6281 Muscle weakness (generalized): Secondary | ICD-10-CM | POA: Diagnosis not present

## 2017-08-28 DIAGNOSIS — E119 Type 2 diabetes mellitus without complications: Secondary | ICD-10-CM | POA: Diagnosis not present

## 2017-08-28 DIAGNOSIS — R278 Other lack of coordination: Secondary | ICD-10-CM | POA: Diagnosis not present

## 2017-08-28 DIAGNOSIS — M25552 Pain in left hip: Secondary | ICD-10-CM | POA: Diagnosis not present

## 2017-08-31 DIAGNOSIS — F333 Major depressive disorder, recurrent, severe with psychotic symptoms: Secondary | ICD-10-CM | POA: Diagnosis not present

## 2017-08-31 DIAGNOSIS — F0150 Vascular dementia without behavioral disturbance: Secondary | ICD-10-CM | POA: Diagnosis not present

## 2017-08-31 DIAGNOSIS — F411 Generalized anxiety disorder: Secondary | ICD-10-CM | POA: Diagnosis not present

## 2017-09-01 DIAGNOSIS — I70262 Atherosclerosis of native arteries of extremities with gangrene, left leg: Secondary | ICD-10-CM | POA: Diagnosis not present

## 2017-09-01 DIAGNOSIS — E1142 Type 2 diabetes mellitus with diabetic polyneuropathy: Secondary | ICD-10-CM | POA: Diagnosis not present

## 2017-09-03 DIAGNOSIS — M25552 Pain in left hip: Secondary | ICD-10-CM | POA: Diagnosis not present

## 2017-09-03 DIAGNOSIS — E119 Type 2 diabetes mellitus without complications: Secondary | ICD-10-CM | POA: Diagnosis not present

## 2017-09-03 DIAGNOSIS — M6281 Muscle weakness (generalized): Secondary | ICD-10-CM | POA: Diagnosis not present

## 2017-09-03 DIAGNOSIS — R278 Other lack of coordination: Secondary | ICD-10-CM | POA: Diagnosis not present

## 2017-09-03 DIAGNOSIS — E785 Hyperlipidemia, unspecified: Secondary | ICD-10-CM | POA: Diagnosis not present

## 2017-09-03 DIAGNOSIS — I10 Essential (primary) hypertension: Secondary | ICD-10-CM | POA: Diagnosis not present

## 2017-09-03 DIAGNOSIS — S72002D Fracture of unspecified part of neck of left femur, subsequent encounter for closed fracture with routine healing: Secondary | ICD-10-CM | POA: Diagnosis not present

## 2017-09-03 DIAGNOSIS — R262 Difficulty in walking, not elsewhere classified: Secondary | ICD-10-CM | POA: Diagnosis not present

## 2017-09-03 DIAGNOSIS — R1312 Dysphagia, oropharyngeal phase: Secondary | ICD-10-CM | POA: Diagnosis not present

## 2017-09-04 DIAGNOSIS — M6281 Muscle weakness (generalized): Secondary | ICD-10-CM | POA: Diagnosis not present

## 2017-09-04 DIAGNOSIS — R262 Difficulty in walking, not elsewhere classified: Secondary | ICD-10-CM | POA: Diagnosis not present

## 2017-09-04 DIAGNOSIS — R1312 Dysphagia, oropharyngeal phase: Secondary | ICD-10-CM | POA: Diagnosis not present

## 2017-09-04 DIAGNOSIS — S72002D Fracture of unspecified part of neck of left femur, subsequent encounter for closed fracture with routine healing: Secondary | ICD-10-CM | POA: Diagnosis not present

## 2017-09-04 DIAGNOSIS — M25552 Pain in left hip: Secondary | ICD-10-CM | POA: Diagnosis not present

## 2017-09-04 DIAGNOSIS — R278 Other lack of coordination: Secondary | ICD-10-CM | POA: Diagnosis not present

## 2017-09-05 DIAGNOSIS — R1312 Dysphagia, oropharyngeal phase: Secondary | ICD-10-CM | POA: Diagnosis not present

## 2017-09-05 DIAGNOSIS — R262 Difficulty in walking, not elsewhere classified: Secondary | ICD-10-CM | POA: Diagnosis not present

## 2017-09-05 DIAGNOSIS — M25552 Pain in left hip: Secondary | ICD-10-CM | POA: Diagnosis not present

## 2017-09-05 DIAGNOSIS — M6281 Muscle weakness (generalized): Secondary | ICD-10-CM | POA: Diagnosis not present

## 2017-09-05 DIAGNOSIS — S72002D Fracture of unspecified part of neck of left femur, subsequent encounter for closed fracture with routine healing: Secondary | ICD-10-CM | POA: Diagnosis not present

## 2017-09-05 DIAGNOSIS — R278 Other lack of coordination: Secondary | ICD-10-CM | POA: Diagnosis not present

## 2017-09-07 DIAGNOSIS — S72002D Fracture of unspecified part of neck of left femur, subsequent encounter for closed fracture with routine healing: Secondary | ICD-10-CM | POA: Diagnosis not present

## 2017-09-07 DIAGNOSIS — R1312 Dysphagia, oropharyngeal phase: Secondary | ICD-10-CM | POA: Diagnosis not present

## 2017-09-07 DIAGNOSIS — M25552 Pain in left hip: Secondary | ICD-10-CM | POA: Diagnosis not present

## 2017-09-07 DIAGNOSIS — R278 Other lack of coordination: Secondary | ICD-10-CM | POA: Diagnosis not present

## 2017-09-07 DIAGNOSIS — M6281 Muscle weakness (generalized): Secondary | ICD-10-CM | POA: Diagnosis not present

## 2017-09-07 DIAGNOSIS — R262 Difficulty in walking, not elsewhere classified: Secondary | ICD-10-CM | POA: Diagnosis not present

## 2017-09-08 DIAGNOSIS — R262 Difficulty in walking, not elsewhere classified: Secondary | ICD-10-CM | POA: Diagnosis not present

## 2017-09-08 DIAGNOSIS — S72002D Fracture of unspecified part of neck of left femur, subsequent encounter for closed fracture with routine healing: Secondary | ICD-10-CM | POA: Diagnosis not present

## 2017-09-08 DIAGNOSIS — M25552 Pain in left hip: Secondary | ICD-10-CM | POA: Diagnosis not present

## 2017-09-08 DIAGNOSIS — M6281 Muscle weakness (generalized): Secondary | ICD-10-CM | POA: Diagnosis not present

## 2017-09-08 DIAGNOSIS — R278 Other lack of coordination: Secondary | ICD-10-CM | POA: Diagnosis not present

## 2017-09-08 DIAGNOSIS — F0390 Unspecified dementia without behavioral disturbance: Secondary | ICD-10-CM | POA: Diagnosis not present

## 2017-09-08 DIAGNOSIS — R1312 Dysphagia, oropharyngeal phase: Secondary | ICD-10-CM | POA: Diagnosis not present

## 2017-09-08 DIAGNOSIS — F333 Major depressive disorder, recurrent, severe with psychotic symptoms: Secondary | ICD-10-CM | POA: Diagnosis not present

## 2017-09-09 DIAGNOSIS — R569 Unspecified convulsions: Secondary | ICD-10-CM | POA: Diagnosis not present

## 2017-09-09 DIAGNOSIS — Z5181 Encounter for therapeutic drug level monitoring: Secondary | ICD-10-CM | POA: Diagnosis not present

## 2017-09-10 DIAGNOSIS — R278 Other lack of coordination: Secondary | ICD-10-CM | POA: Diagnosis not present

## 2017-09-10 DIAGNOSIS — R1312 Dysphagia, oropharyngeal phase: Secondary | ICD-10-CM | POA: Diagnosis not present

## 2017-09-10 DIAGNOSIS — M25552 Pain in left hip: Secondary | ICD-10-CM | POA: Diagnosis not present

## 2017-09-10 DIAGNOSIS — R262 Difficulty in walking, not elsewhere classified: Secondary | ICD-10-CM | POA: Diagnosis not present

## 2017-09-10 DIAGNOSIS — M6281 Muscle weakness (generalized): Secondary | ICD-10-CM | POA: Diagnosis not present

## 2017-09-10 DIAGNOSIS — S72002D Fracture of unspecified part of neck of left femur, subsequent encounter for closed fracture with routine healing: Secondary | ICD-10-CM | POA: Diagnosis not present

## 2017-09-15 DIAGNOSIS — M6281 Muscle weakness (generalized): Secondary | ICD-10-CM | POA: Diagnosis not present

## 2017-09-15 DIAGNOSIS — R278 Other lack of coordination: Secondary | ICD-10-CM | POA: Diagnosis not present

## 2017-09-15 DIAGNOSIS — R1312 Dysphagia, oropharyngeal phase: Secondary | ICD-10-CM | POA: Diagnosis not present

## 2017-09-15 DIAGNOSIS — R262 Difficulty in walking, not elsewhere classified: Secondary | ICD-10-CM | POA: Diagnosis not present

## 2017-09-15 DIAGNOSIS — M25552 Pain in left hip: Secondary | ICD-10-CM | POA: Diagnosis not present

## 2017-09-15 DIAGNOSIS — S72002D Fracture of unspecified part of neck of left femur, subsequent encounter for closed fracture with routine healing: Secondary | ICD-10-CM | POA: Diagnosis not present

## 2017-09-16 DIAGNOSIS — D508 Other iron deficiency anemias: Secondary | ICD-10-CM | POA: Diagnosis not present

## 2017-09-16 DIAGNOSIS — I70262 Atherosclerosis of native arteries of extremities with gangrene, left leg: Secondary | ICD-10-CM | POA: Diagnosis not present

## 2017-09-16 DIAGNOSIS — E1142 Type 2 diabetes mellitus with diabetic polyneuropathy: Secondary | ICD-10-CM | POA: Diagnosis not present

## 2017-09-16 DIAGNOSIS — M1990 Unspecified osteoarthritis, unspecified site: Secondary | ICD-10-CM | POA: Diagnosis not present

## 2017-09-17 DIAGNOSIS — R1312 Dysphagia, oropharyngeal phase: Secondary | ICD-10-CM | POA: Diagnosis not present

## 2017-09-17 DIAGNOSIS — M6281 Muscle weakness (generalized): Secondary | ICD-10-CM | POA: Diagnosis not present

## 2017-09-17 DIAGNOSIS — R262 Difficulty in walking, not elsewhere classified: Secondary | ICD-10-CM | POA: Diagnosis not present

## 2017-09-17 DIAGNOSIS — S72002D Fracture of unspecified part of neck of left femur, subsequent encounter for closed fracture with routine healing: Secondary | ICD-10-CM | POA: Diagnosis not present

## 2017-09-17 DIAGNOSIS — R278 Other lack of coordination: Secondary | ICD-10-CM | POA: Diagnosis not present

## 2017-09-17 DIAGNOSIS — M25552 Pain in left hip: Secondary | ICD-10-CM | POA: Diagnosis not present

## 2017-09-18 DIAGNOSIS — S91105A Unspecified open wound of left lesser toe(s) without damage to nail, initial encounter: Secondary | ICD-10-CM | POA: Diagnosis not present

## 2017-09-18 DIAGNOSIS — M25552 Pain in left hip: Secondary | ICD-10-CM | POA: Diagnosis not present

## 2017-09-18 DIAGNOSIS — R262 Difficulty in walking, not elsewhere classified: Secondary | ICD-10-CM | POA: Diagnosis not present

## 2017-09-18 DIAGNOSIS — R278 Other lack of coordination: Secondary | ICD-10-CM | POA: Diagnosis not present

## 2017-09-18 DIAGNOSIS — S72002D Fracture of unspecified part of neck of left femur, subsequent encounter for closed fracture with routine healing: Secondary | ICD-10-CM | POA: Diagnosis not present

## 2017-09-18 DIAGNOSIS — M6281 Muscle weakness (generalized): Secondary | ICD-10-CM | POA: Diagnosis not present

## 2017-09-18 DIAGNOSIS — E1142 Type 2 diabetes mellitus with diabetic polyneuropathy: Secondary | ICD-10-CM | POA: Diagnosis not present

## 2017-09-18 DIAGNOSIS — R1312 Dysphagia, oropharyngeal phase: Secondary | ICD-10-CM | POA: Diagnosis not present

## 2017-09-21 DIAGNOSIS — S72002D Fracture of unspecified part of neck of left femur, subsequent encounter for closed fracture with routine healing: Secondary | ICD-10-CM | POA: Diagnosis not present

## 2017-09-21 DIAGNOSIS — R278 Other lack of coordination: Secondary | ICD-10-CM | POA: Diagnosis not present

## 2017-09-21 DIAGNOSIS — I10 Essential (primary) hypertension: Secondary | ICD-10-CM | POA: Diagnosis not present

## 2017-09-21 DIAGNOSIS — R262 Difficulty in walking, not elsewhere classified: Secondary | ICD-10-CM | POA: Diagnosis not present

## 2017-09-21 DIAGNOSIS — D649 Anemia, unspecified: Secondary | ICD-10-CM | POA: Diagnosis not present

## 2017-09-21 DIAGNOSIS — M25552 Pain in left hip: Secondary | ICD-10-CM | POA: Diagnosis not present

## 2017-09-21 DIAGNOSIS — E785 Hyperlipidemia, unspecified: Secondary | ICD-10-CM | POA: Diagnosis not present

## 2017-09-21 DIAGNOSIS — R1312 Dysphagia, oropharyngeal phase: Secondary | ICD-10-CM | POA: Diagnosis not present

## 2017-09-21 DIAGNOSIS — M6281 Muscle weakness (generalized): Secondary | ICD-10-CM | POA: Diagnosis not present

## 2017-09-22 DIAGNOSIS — R262 Difficulty in walking, not elsewhere classified: Secondary | ICD-10-CM | POA: Diagnosis not present

## 2017-09-22 DIAGNOSIS — S72002D Fracture of unspecified part of neck of left femur, subsequent encounter for closed fracture with routine healing: Secondary | ICD-10-CM | POA: Diagnosis not present

## 2017-09-22 DIAGNOSIS — M25552 Pain in left hip: Secondary | ICD-10-CM | POA: Diagnosis not present

## 2017-09-22 DIAGNOSIS — F0390 Unspecified dementia without behavioral disturbance: Secondary | ICD-10-CM | POA: Diagnosis not present

## 2017-09-22 DIAGNOSIS — R278 Other lack of coordination: Secondary | ICD-10-CM | POA: Diagnosis not present

## 2017-09-22 DIAGNOSIS — R1312 Dysphagia, oropharyngeal phase: Secondary | ICD-10-CM | POA: Diagnosis not present

## 2017-09-22 DIAGNOSIS — F333 Major depressive disorder, recurrent, severe with psychotic symptoms: Secondary | ICD-10-CM | POA: Diagnosis not present

## 2017-09-22 DIAGNOSIS — M6281 Muscle weakness (generalized): Secondary | ICD-10-CM | POA: Diagnosis not present

## 2017-09-23 DIAGNOSIS — S72002D Fracture of unspecified part of neck of left femur, subsequent encounter for closed fracture with routine healing: Secondary | ICD-10-CM | POA: Diagnosis not present

## 2017-09-23 DIAGNOSIS — R1312 Dysphagia, oropharyngeal phase: Secondary | ICD-10-CM | POA: Diagnosis not present

## 2017-09-23 DIAGNOSIS — M6281 Muscle weakness (generalized): Secondary | ICD-10-CM | POA: Diagnosis not present

## 2017-09-23 DIAGNOSIS — R278 Other lack of coordination: Secondary | ICD-10-CM | POA: Diagnosis not present

## 2017-09-23 DIAGNOSIS — R262 Difficulty in walking, not elsewhere classified: Secondary | ICD-10-CM | POA: Diagnosis not present

## 2017-09-23 DIAGNOSIS — M25552 Pain in left hip: Secondary | ICD-10-CM | POA: Diagnosis not present

## 2017-09-28 DIAGNOSIS — F0150 Vascular dementia without behavioral disturbance: Secondary | ICD-10-CM | POA: Diagnosis not present

## 2017-09-28 DIAGNOSIS — F333 Major depressive disorder, recurrent, severe with psychotic symptoms: Secondary | ICD-10-CM | POA: Diagnosis not present

## 2017-09-28 DIAGNOSIS — F411 Generalized anxiety disorder: Secondary | ICD-10-CM | POA: Diagnosis not present

## 2017-09-28 DIAGNOSIS — N400 Enlarged prostate without lower urinary tract symptoms: Secondary | ICD-10-CM | POA: Diagnosis not present

## 2017-09-30 DIAGNOSIS — R262 Difficulty in walking, not elsewhere classified: Secondary | ICD-10-CM | POA: Diagnosis not present

## 2017-09-30 DIAGNOSIS — S72002D Fracture of unspecified part of neck of left femur, subsequent encounter for closed fracture with routine healing: Secondary | ICD-10-CM | POA: Diagnosis not present

## 2017-09-30 DIAGNOSIS — R1312 Dysphagia, oropharyngeal phase: Secondary | ICD-10-CM | POA: Diagnosis not present

## 2017-09-30 DIAGNOSIS — M6281 Muscle weakness (generalized): Secondary | ICD-10-CM | POA: Diagnosis not present

## 2017-09-30 DIAGNOSIS — R278 Other lack of coordination: Secondary | ICD-10-CM | POA: Diagnosis not present

## 2017-09-30 DIAGNOSIS — M25552 Pain in left hip: Secondary | ICD-10-CM | POA: Diagnosis not present

## 2017-10-01 DIAGNOSIS — R1312 Dysphagia, oropharyngeal phase: Secondary | ICD-10-CM | POA: Diagnosis not present

## 2017-10-01 DIAGNOSIS — S72002D Fracture of unspecified part of neck of left femur, subsequent encounter for closed fracture with routine healing: Secondary | ICD-10-CM | POA: Diagnosis not present

## 2017-10-01 DIAGNOSIS — M6281 Muscle weakness (generalized): Secondary | ICD-10-CM | POA: Diagnosis not present

## 2017-10-01 DIAGNOSIS — M25552 Pain in left hip: Secondary | ICD-10-CM | POA: Diagnosis not present

## 2017-10-01 DIAGNOSIS — R278 Other lack of coordination: Secondary | ICD-10-CM | POA: Diagnosis not present

## 2017-10-01 DIAGNOSIS — R262 Difficulty in walking, not elsewhere classified: Secondary | ICD-10-CM | POA: Diagnosis not present

## 2017-10-03 DIAGNOSIS — R278 Other lack of coordination: Secondary | ICD-10-CM | POA: Diagnosis not present

## 2017-10-03 DIAGNOSIS — S72002D Fracture of unspecified part of neck of left femur, subsequent encounter for closed fracture with routine healing: Secondary | ICD-10-CM | POA: Diagnosis not present

## 2017-10-03 DIAGNOSIS — R262 Difficulty in walking, not elsewhere classified: Secondary | ICD-10-CM | POA: Diagnosis not present

## 2017-10-03 DIAGNOSIS — M25552 Pain in left hip: Secondary | ICD-10-CM | POA: Diagnosis not present

## 2017-10-03 DIAGNOSIS — M6281 Muscle weakness (generalized): Secondary | ICD-10-CM | POA: Diagnosis not present

## 2017-10-03 DIAGNOSIS — R1312 Dysphagia, oropharyngeal phase: Secondary | ICD-10-CM | POA: Diagnosis not present

## 2017-10-07 DIAGNOSIS — F0390 Unspecified dementia without behavioral disturbance: Secondary | ICD-10-CM | POA: Diagnosis not present

## 2017-10-07 DIAGNOSIS — F333 Major depressive disorder, recurrent, severe with psychotic symptoms: Secondary | ICD-10-CM | POA: Diagnosis not present

## 2017-10-09 DIAGNOSIS — Z79899 Other long term (current) drug therapy: Secondary | ICD-10-CM | POA: Diagnosis not present

## 2017-10-09 DIAGNOSIS — S91302A Unspecified open wound, left foot, initial encounter: Secondary | ICD-10-CM | POA: Diagnosis not present

## 2017-10-10 DIAGNOSIS — S91302A Unspecified open wound, left foot, initial encounter: Secondary | ICD-10-CM | POA: Diagnosis not present

## 2017-10-14 DIAGNOSIS — M1990 Unspecified osteoarthritis, unspecified site: Secondary | ICD-10-CM | POA: Diagnosis not present

## 2017-10-14 DIAGNOSIS — I70262 Atherosclerosis of native arteries of extremities with gangrene, left leg: Secondary | ICD-10-CM | POA: Diagnosis not present

## 2017-10-14 DIAGNOSIS — E1142 Type 2 diabetes mellitus with diabetic polyneuropathy: Secondary | ICD-10-CM | POA: Diagnosis not present

## 2017-10-14 DIAGNOSIS — I10 Essential (primary) hypertension: Secondary | ICD-10-CM | POA: Diagnosis not present

## 2017-10-26 DIAGNOSIS — F411 Generalized anxiety disorder: Secondary | ICD-10-CM | POA: Diagnosis not present

## 2017-10-26 DIAGNOSIS — F333 Major depressive disorder, recurrent, severe with psychotic symptoms: Secondary | ICD-10-CM | POA: Diagnosis not present

## 2017-10-26 DIAGNOSIS — F0150 Vascular dementia without behavioral disturbance: Secondary | ICD-10-CM | POA: Diagnosis not present

## 2017-10-29 ENCOUNTER — Other Ambulatory Visit: Payer: Self-pay

## 2017-10-29 ENCOUNTER — Encounter: Payer: Self-pay | Admitting: Vascular Surgery

## 2017-10-29 ENCOUNTER — Ambulatory Visit (INDEPENDENT_AMBULATORY_CARE_PROVIDER_SITE_OTHER): Payer: Medicare Other | Admitting: Vascular Surgery

## 2017-10-29 VITALS — BP 136/64 | HR 60 | Temp 97.8°F | Resp 20 | Ht 73.0 in | Wt 175.0 lb

## 2017-10-29 DIAGNOSIS — I739 Peripheral vascular disease, unspecified: Secondary | ICD-10-CM

## 2017-10-29 MED ORDER — LEVOFLOXACIN 500 MG PO TABS: 500.0000 mg | ORAL_TABLET | Freq: Every day | ORAL | 0 refills | Status: DC

## 2017-10-29 NOTE — Progress Notes (Signed)
Patient is a 77 year old male added on as an urgent office visit today for deteriorating wounds in his left foot.  He has previously undergone a left popliteal to peroneal bypass by Dr. Edilia Bo.  This was in June 2018.  His wounds had fully healed.  However, he has not developed new wounds on toes on the left foot.  There was concern today because of the smell of the foot wound.  He denies any pain.  He currently resides at a skilled nursing facility.  They are currently treating this with dry dressings.  He denies fever or chills.  Physical exam:  Vitals:   10/29/17 1457  BP: 136/64  Pulse: 60  Resp: 20  Temp: 97.8 F (36.6 C)  TempSrc: Oral  SpO2: 96%  Weight: 175 lb (79.4 kg)  Height: 6\' 1"  (1.854 m)   Extremities: Left foot is warm, hemosiderin staining left gaiter area, right leg amputation  Skin: As depicted below      Assessment: Superficial wounds left second toe and first toe amputation site some granulation tissue at the base no purulence no erythema but does have a foul smell about it.  Plan: #1 the patient be started on Levaquin 500 mg once a day for 2 weeks.    #2 The patient will follow-up with Dr. in 2 weeks to see if he has had improvement in wound healing otherwise consider patient for foot debridement   #3 elevate left leg is much as possible to reduce edema   #4 patient will have a duplex ultrasound and ABI of the left leg when he sees Dr. Edilia Bo in 2 weeks  Edilia Bo, MD [facility] [place] Office: [phone] Pager: [phone]

## 2017-10-30 ENCOUNTER — Other Ambulatory Visit: Payer: Self-pay

## 2017-10-30 DIAGNOSIS — I739 Peripheral vascular disease, unspecified: Secondary | ICD-10-CM

## 2017-10-30 DIAGNOSIS — I70245 Atherosclerosis of native arteries of left leg with ulceration of other part of foot: Secondary | ICD-10-CM

## 2017-11-06 DIAGNOSIS — S72002D Fracture of unspecified part of neck of left femur, subsequent encounter for closed fracture with routine healing: Secondary | ICD-10-CM | POA: Diagnosis not present

## 2017-11-06 DIAGNOSIS — R278 Other lack of coordination: Secondary | ICD-10-CM | POA: Diagnosis not present

## 2017-11-06 DIAGNOSIS — M6281 Muscle weakness (generalized): Secondary | ICD-10-CM | POA: Diagnosis not present

## 2017-11-06 DIAGNOSIS — R262 Difficulty in walking, not elsewhere classified: Secondary | ICD-10-CM | POA: Diagnosis not present

## 2017-11-06 DIAGNOSIS — R1312 Dysphagia, oropharyngeal phase: Secondary | ICD-10-CM | POA: Diagnosis not present

## 2017-11-06 DIAGNOSIS — R41841 Cognitive communication deficit: Secondary | ICD-10-CM | POA: Diagnosis not present

## 2017-11-06 DIAGNOSIS — M25552 Pain in left hip: Secondary | ICD-10-CM | POA: Diagnosis not present

## 2017-11-09 DIAGNOSIS — R600 Localized edema: Secondary | ICD-10-CM | POA: Diagnosis not present

## 2017-11-09 DIAGNOSIS — J189 Pneumonia, unspecified organism: Secondary | ICD-10-CM | POA: Diagnosis not present

## 2017-11-09 DIAGNOSIS — R064 Hyperventilation: Secondary | ICD-10-CM | POA: Diagnosis not present

## 2017-11-09 DIAGNOSIS — L03032 Cellulitis of left toe: Secondary | ICD-10-CM | POA: Diagnosis not present

## 2017-11-09 DIAGNOSIS — S7292XA Unspecified fracture of left femur, initial encounter for closed fracture: Secondary | ICD-10-CM | POA: Diagnosis not present

## 2017-11-09 DIAGNOSIS — M79662 Pain in left lower leg: Secondary | ICD-10-CM | POA: Diagnosis not present

## 2017-11-09 DIAGNOSIS — S30810A Abrasion of lower back and pelvis, initial encounter: Secondary | ICD-10-CM | POA: Diagnosis not present

## 2017-11-09 DIAGNOSIS — J90 Pleural effusion, not elsewhere classified: Secondary | ICD-10-CM | POA: Diagnosis not present

## 2017-11-09 DIAGNOSIS — M9702XA Periprosthetic fracture around internal prosthetic left hip joint, initial encounter: Secondary | ICD-10-CM | POA: Diagnosis not present

## 2017-11-09 DIAGNOSIS — Z7401 Bed confinement status: Secondary | ICD-10-CM | POA: Diagnosis not present

## 2017-11-09 DIAGNOSIS — S8992XA Unspecified injury of left lower leg, initial encounter: Secondary | ICD-10-CM | POA: Diagnosis not present

## 2017-11-09 DIAGNOSIS — W19XXXA Unspecified fall, initial encounter: Secondary | ICD-10-CM | POA: Diagnosis not present

## 2017-11-09 DIAGNOSIS — J9811 Atelectasis: Secondary | ICD-10-CM | POA: Diagnosis not present

## 2017-11-09 DIAGNOSIS — R531 Weakness: Secondary | ICD-10-CM | POA: Diagnosis not present

## 2017-11-09 DIAGNOSIS — L03116 Cellulitis of left lower limb: Secondary | ICD-10-CM | POA: Diagnosis not present

## 2017-11-09 DIAGNOSIS — R0902 Hypoxemia: Secondary | ICD-10-CM | POA: Diagnosis not present

## 2017-11-10 DIAGNOSIS — M25552 Pain in left hip: Secondary | ICD-10-CM | POA: Diagnosis not present

## 2017-11-11 ENCOUNTER — Ambulatory Visit (HOSPITAL_COMMUNITY): Payer: Medicare Other

## 2017-11-11 ENCOUNTER — Ambulatory Visit: Payer: Medicare Other | Admitting: Vascular Surgery

## 2017-11-11 DIAGNOSIS — S72115A Nondisplaced fracture of greater trochanter of left femur, initial encounter for closed fracture: Secondary | ICD-10-CM | POA: Diagnosis not present

## 2017-11-11 DIAGNOSIS — L03116 Cellulitis of left lower limb: Secondary | ICD-10-CM | POA: Diagnosis not present

## 2017-11-11 DIAGNOSIS — I10 Essential (primary) hypertension: Secondary | ICD-10-CM | POA: Diagnosis not present

## 2017-11-11 DIAGNOSIS — N400 Enlarged prostate without lower urinary tract symptoms: Secondary | ICD-10-CM | POA: Diagnosis not present

## 2017-11-11 DIAGNOSIS — E11649 Type 2 diabetes mellitus with hypoglycemia without coma: Secondary | ICD-10-CM | POA: Diagnosis not present

## 2017-11-11 DIAGNOSIS — F0390 Unspecified dementia without behavioral disturbance: Secondary | ICD-10-CM | POA: Diagnosis not present

## 2017-11-11 DIAGNOSIS — S72009A Fracture of unspecified part of neck of unspecified femur, initial encounter for closed fracture: Secondary | ICD-10-CM | POA: Diagnosis not present

## 2017-11-11 DIAGNOSIS — R5381 Other malaise: Secondary | ICD-10-CM | POA: Diagnosis not present

## 2017-11-11 DIAGNOSIS — Z7401 Bed confinement status: Secondary | ICD-10-CM | POA: Diagnosis not present

## 2017-11-12 DIAGNOSIS — M1990 Unspecified osteoarthritis, unspecified site: Secondary | ICD-10-CM | POA: Diagnosis not present

## 2017-11-12 DIAGNOSIS — I10 Essential (primary) hypertension: Secondary | ICD-10-CM | POA: Diagnosis not present

## 2017-11-12 DIAGNOSIS — R278 Other lack of coordination: Secondary | ICD-10-CM | POA: Diagnosis not present

## 2017-11-12 DIAGNOSIS — M6281 Muscle weakness (generalized): Secondary | ICD-10-CM | POA: Diagnosis not present

## 2017-11-12 DIAGNOSIS — N400 Enlarged prostate without lower urinary tract symptoms: Secondary | ICD-10-CM | POA: Diagnosis not present

## 2017-11-12 DIAGNOSIS — M25552 Pain in left hip: Secondary | ICD-10-CM | POA: Diagnosis not present

## 2017-11-12 DIAGNOSIS — S72002D Fracture of unspecified part of neck of left femur, subsequent encounter for closed fracture with routine healing: Secondary | ICD-10-CM | POA: Diagnosis not present

## 2017-11-12 DIAGNOSIS — S72392D Other fracture of shaft of left femur, subsequent encounter for closed fracture with routine healing: Secondary | ICD-10-CM | POA: Diagnosis not present

## 2017-11-12 DIAGNOSIS — R262 Difficulty in walking, not elsewhere classified: Secondary | ICD-10-CM | POA: Diagnosis not present

## 2017-11-12 DIAGNOSIS — R1312 Dysphagia, oropharyngeal phase: Secondary | ICD-10-CM | POA: Diagnosis not present

## 2017-11-13 DIAGNOSIS — M25552 Pain in left hip: Secondary | ICD-10-CM | POA: Diagnosis not present

## 2017-11-13 DIAGNOSIS — F0390 Unspecified dementia without behavioral disturbance: Secondary | ICD-10-CM | POA: Diagnosis not present

## 2017-11-13 DIAGNOSIS — S72002D Fracture of unspecified part of neck of left femur, subsequent encounter for closed fracture with routine healing: Secondary | ICD-10-CM | POA: Diagnosis not present

## 2017-11-13 DIAGNOSIS — R262 Difficulty in walking, not elsewhere classified: Secondary | ICD-10-CM | POA: Diagnosis not present

## 2017-11-13 DIAGNOSIS — R1312 Dysphagia, oropharyngeal phase: Secondary | ICD-10-CM | POA: Diagnosis not present

## 2017-11-13 DIAGNOSIS — R278 Other lack of coordination: Secondary | ICD-10-CM | POA: Diagnosis not present

## 2017-11-13 DIAGNOSIS — I10 Essential (primary) hypertension: Secondary | ICD-10-CM | POA: Diagnosis not present

## 2017-11-13 DIAGNOSIS — F411 Generalized anxiety disorder: Secondary | ICD-10-CM | POA: Diagnosis not present

## 2017-11-13 DIAGNOSIS — M6281 Muscle weakness (generalized): Secondary | ICD-10-CM | POA: Diagnosis not present

## 2017-11-13 DIAGNOSIS — S72392D Other fracture of shaft of left femur, subsequent encounter for closed fracture with routine healing: Secondary | ICD-10-CM | POA: Diagnosis not present

## 2017-11-16 DIAGNOSIS — M25552 Pain in left hip: Secondary | ICD-10-CM | POA: Diagnosis not present

## 2017-11-16 DIAGNOSIS — M6281 Muscle weakness (generalized): Secondary | ICD-10-CM | POA: Diagnosis not present

## 2017-11-16 DIAGNOSIS — R278 Other lack of coordination: Secondary | ICD-10-CM | POA: Diagnosis not present

## 2017-11-16 DIAGNOSIS — R262 Difficulty in walking, not elsewhere classified: Secondary | ICD-10-CM | POA: Diagnosis not present

## 2017-11-16 DIAGNOSIS — R41841 Cognitive communication deficit: Secondary | ICD-10-CM | POA: Diagnosis not present

## 2017-11-16 DIAGNOSIS — M9702XD Periprosthetic fracture around internal prosthetic left hip joint, subsequent encounter: Secondary | ICD-10-CM | POA: Diagnosis not present

## 2017-11-16 DIAGNOSIS — R1312 Dysphagia, oropharyngeal phase: Secondary | ICD-10-CM | POA: Diagnosis not present

## 2017-11-16 DIAGNOSIS — R1314 Dysphagia, pharyngoesophageal phase: Secondary | ICD-10-CM | POA: Diagnosis not present

## 2017-11-17 DIAGNOSIS — M25552 Pain in left hip: Secondary | ICD-10-CM | POA: Diagnosis not present

## 2017-11-17 DIAGNOSIS — M9702XD Periprosthetic fracture around internal prosthetic left hip joint, subsequent encounter: Secondary | ICD-10-CM | POA: Diagnosis not present

## 2017-11-17 DIAGNOSIS — R262 Difficulty in walking, not elsewhere classified: Secondary | ICD-10-CM | POA: Diagnosis not present

## 2017-11-17 DIAGNOSIS — R41841 Cognitive communication deficit: Secondary | ICD-10-CM | POA: Diagnosis not present

## 2017-11-17 DIAGNOSIS — M6281 Muscle weakness (generalized): Secondary | ICD-10-CM | POA: Diagnosis not present

## 2017-11-17 DIAGNOSIS — R1314 Dysphagia, pharyngoesophageal phase: Secondary | ICD-10-CM | POA: Diagnosis not present

## 2017-11-18 DIAGNOSIS — R41841 Cognitive communication deficit: Secondary | ICD-10-CM | POA: Diagnosis not present

## 2017-11-18 DIAGNOSIS — M6281 Muscle weakness (generalized): Secondary | ICD-10-CM | POA: Diagnosis not present

## 2017-11-18 DIAGNOSIS — R1314 Dysphagia, pharyngoesophageal phase: Secondary | ICD-10-CM | POA: Diagnosis not present

## 2017-11-18 DIAGNOSIS — M9702XD Periprosthetic fracture around internal prosthetic left hip joint, subsequent encounter: Secondary | ICD-10-CM | POA: Diagnosis not present

## 2017-11-18 DIAGNOSIS — R262 Difficulty in walking, not elsewhere classified: Secondary | ICD-10-CM | POA: Diagnosis not present

## 2017-11-18 DIAGNOSIS — M25552 Pain in left hip: Secondary | ICD-10-CM | POA: Diagnosis not present

## 2017-11-19 DIAGNOSIS — R41841 Cognitive communication deficit: Secondary | ICD-10-CM | POA: Diagnosis not present

## 2017-11-19 DIAGNOSIS — M25552 Pain in left hip: Secondary | ICD-10-CM | POA: Diagnosis not present

## 2017-11-19 DIAGNOSIS — M6281 Muscle weakness (generalized): Secondary | ICD-10-CM | POA: Diagnosis not present

## 2017-11-19 DIAGNOSIS — R262 Difficulty in walking, not elsewhere classified: Secondary | ICD-10-CM | POA: Diagnosis not present

## 2017-11-19 DIAGNOSIS — M9702XD Periprosthetic fracture around internal prosthetic left hip joint, subsequent encounter: Secondary | ICD-10-CM | POA: Diagnosis not present

## 2017-11-19 DIAGNOSIS — R1314 Dysphagia, pharyngoesophageal phase: Secondary | ICD-10-CM | POA: Diagnosis not present

## 2017-11-20 DIAGNOSIS — M9702XD Periprosthetic fracture around internal prosthetic left hip joint, subsequent encounter: Secondary | ICD-10-CM | POA: Diagnosis not present

## 2017-11-20 DIAGNOSIS — R41841 Cognitive communication deficit: Secondary | ICD-10-CM | POA: Diagnosis not present

## 2017-11-20 DIAGNOSIS — M25552 Pain in left hip: Secondary | ICD-10-CM | POA: Diagnosis not present

## 2017-11-20 DIAGNOSIS — R262 Difficulty in walking, not elsewhere classified: Secondary | ICD-10-CM | POA: Diagnosis not present

## 2017-11-20 DIAGNOSIS — M6281 Muscle weakness (generalized): Secondary | ICD-10-CM | POA: Diagnosis not present

## 2017-11-20 DIAGNOSIS — R1314 Dysphagia, pharyngoesophageal phase: Secondary | ICD-10-CM | POA: Diagnosis not present

## 2017-11-23 DIAGNOSIS — M6281 Muscle weakness (generalized): Secondary | ICD-10-CM | POA: Diagnosis not present

## 2017-11-23 DIAGNOSIS — R1314 Dysphagia, pharyngoesophageal phase: Secondary | ICD-10-CM | POA: Diagnosis not present

## 2017-11-23 DIAGNOSIS — M25552 Pain in left hip: Secondary | ICD-10-CM | POA: Diagnosis not present

## 2017-11-23 DIAGNOSIS — R262 Difficulty in walking, not elsewhere classified: Secondary | ICD-10-CM | POA: Diagnosis not present

## 2017-11-23 DIAGNOSIS — M9702XD Periprosthetic fracture around internal prosthetic left hip joint, subsequent encounter: Secondary | ICD-10-CM | POA: Diagnosis not present

## 2017-11-23 DIAGNOSIS — R41841 Cognitive communication deficit: Secondary | ICD-10-CM | POA: Diagnosis not present

## 2017-11-24 DIAGNOSIS — M9702XD Periprosthetic fracture around internal prosthetic left hip joint, subsequent encounter: Secondary | ICD-10-CM | POA: Diagnosis not present

## 2017-11-24 DIAGNOSIS — R262 Difficulty in walking, not elsewhere classified: Secondary | ICD-10-CM | POA: Diagnosis not present

## 2017-11-24 DIAGNOSIS — R41841 Cognitive communication deficit: Secondary | ICD-10-CM | POA: Diagnosis not present

## 2017-11-24 DIAGNOSIS — R1314 Dysphagia, pharyngoesophageal phase: Secondary | ICD-10-CM | POA: Diagnosis not present

## 2017-11-24 DIAGNOSIS — M6281 Muscle weakness (generalized): Secondary | ICD-10-CM | POA: Diagnosis not present

## 2017-11-24 DIAGNOSIS — M25552 Pain in left hip: Secondary | ICD-10-CM | POA: Diagnosis not present

## 2017-11-25 DIAGNOSIS — M9702XD Periprosthetic fracture around internal prosthetic left hip joint, subsequent encounter: Secondary | ICD-10-CM | POA: Diagnosis not present

## 2017-11-25 DIAGNOSIS — M6281 Muscle weakness (generalized): Secondary | ICD-10-CM | POA: Diagnosis not present

## 2017-11-25 DIAGNOSIS — R41841 Cognitive communication deficit: Secondary | ICD-10-CM | POA: Diagnosis not present

## 2017-11-25 DIAGNOSIS — R1314 Dysphagia, pharyngoesophageal phase: Secondary | ICD-10-CM | POA: Diagnosis not present

## 2017-11-25 DIAGNOSIS — R262 Difficulty in walking, not elsewhere classified: Secondary | ICD-10-CM | POA: Diagnosis not present

## 2017-11-25 DIAGNOSIS — M25552 Pain in left hip: Secondary | ICD-10-CM | POA: Diagnosis not present

## 2017-11-26 DIAGNOSIS — M9702XD Periprosthetic fracture around internal prosthetic left hip joint, subsequent encounter: Secondary | ICD-10-CM | POA: Diagnosis not present

## 2017-11-26 DIAGNOSIS — R262 Difficulty in walking, not elsewhere classified: Secondary | ICD-10-CM | POA: Diagnosis not present

## 2017-11-26 DIAGNOSIS — M6281 Muscle weakness (generalized): Secondary | ICD-10-CM | POA: Diagnosis not present

## 2017-11-26 DIAGNOSIS — R1314 Dysphagia, pharyngoesophageal phase: Secondary | ICD-10-CM | POA: Diagnosis not present

## 2017-11-26 DIAGNOSIS — M25552 Pain in left hip: Secondary | ICD-10-CM | POA: Diagnosis not present

## 2017-11-26 DIAGNOSIS — R41841 Cognitive communication deficit: Secondary | ICD-10-CM | POA: Diagnosis not present

## 2017-11-27 DIAGNOSIS — R1314 Dysphagia, pharyngoesophageal phase: Secondary | ICD-10-CM | POA: Diagnosis not present

## 2017-11-27 DIAGNOSIS — R262 Difficulty in walking, not elsewhere classified: Secondary | ICD-10-CM | POA: Diagnosis not present

## 2017-11-27 DIAGNOSIS — R41841 Cognitive communication deficit: Secondary | ICD-10-CM | POA: Diagnosis not present

## 2017-11-27 DIAGNOSIS — M6281 Muscle weakness (generalized): Secondary | ICD-10-CM | POA: Diagnosis not present

## 2017-11-27 DIAGNOSIS — M9702XD Periprosthetic fracture around internal prosthetic left hip joint, subsequent encounter: Secondary | ICD-10-CM | POA: Diagnosis not present

## 2017-11-27 DIAGNOSIS — M25552 Pain in left hip: Secondary | ICD-10-CM | POA: Diagnosis not present

## 2017-11-28 DIAGNOSIS — M9702XD Periprosthetic fracture around internal prosthetic left hip joint, subsequent encounter: Secondary | ICD-10-CM | POA: Diagnosis not present

## 2017-11-28 DIAGNOSIS — M25552 Pain in left hip: Secondary | ICD-10-CM | POA: Diagnosis not present

## 2017-11-28 DIAGNOSIS — R1314 Dysphagia, pharyngoesophageal phase: Secondary | ICD-10-CM | POA: Diagnosis not present

## 2017-11-28 DIAGNOSIS — R41841 Cognitive communication deficit: Secondary | ICD-10-CM | POA: Diagnosis not present

## 2017-11-28 DIAGNOSIS — M6281 Muscle weakness (generalized): Secondary | ICD-10-CM | POA: Diagnosis not present

## 2017-11-28 DIAGNOSIS — R262 Difficulty in walking, not elsewhere classified: Secondary | ICD-10-CM | POA: Diagnosis not present

## 2017-11-28 DIAGNOSIS — E119 Type 2 diabetes mellitus without complications: Secondary | ICD-10-CM | POA: Diagnosis not present

## 2017-11-29 DIAGNOSIS — R262 Difficulty in walking, not elsewhere classified: Secondary | ICD-10-CM | POA: Diagnosis not present

## 2017-11-29 DIAGNOSIS — R1314 Dysphagia, pharyngoesophageal phase: Secondary | ICD-10-CM | POA: Diagnosis not present

## 2017-11-29 DIAGNOSIS — M6281 Muscle weakness (generalized): Secondary | ICD-10-CM | POA: Diagnosis not present

## 2017-11-29 DIAGNOSIS — R41841 Cognitive communication deficit: Secondary | ICD-10-CM | POA: Diagnosis not present

## 2017-11-29 DIAGNOSIS — M9702XD Periprosthetic fracture around internal prosthetic left hip joint, subsequent encounter: Secondary | ICD-10-CM | POA: Diagnosis not present

## 2017-11-29 DIAGNOSIS — M25552 Pain in left hip: Secondary | ICD-10-CM | POA: Diagnosis not present

## 2017-11-30 DIAGNOSIS — M6281 Muscle weakness (generalized): Secondary | ICD-10-CM | POA: Diagnosis not present

## 2017-11-30 DIAGNOSIS — R1314 Dysphagia, pharyngoesophageal phase: Secondary | ICD-10-CM | POA: Diagnosis not present

## 2017-11-30 DIAGNOSIS — M9702XD Periprosthetic fracture around internal prosthetic left hip joint, subsequent encounter: Secondary | ICD-10-CM | POA: Diagnosis not present

## 2017-11-30 DIAGNOSIS — R41841 Cognitive communication deficit: Secondary | ICD-10-CM | POA: Diagnosis not present

## 2017-11-30 DIAGNOSIS — M25552 Pain in left hip: Secondary | ICD-10-CM | POA: Diagnosis not present

## 2017-11-30 DIAGNOSIS — R262 Difficulty in walking, not elsewhere classified: Secondary | ICD-10-CM | POA: Diagnosis not present

## 2017-12-01 DIAGNOSIS — M25552 Pain in left hip: Secondary | ICD-10-CM | POA: Diagnosis not present

## 2017-12-01 DIAGNOSIS — R262 Difficulty in walking, not elsewhere classified: Secondary | ICD-10-CM | POA: Diagnosis not present

## 2017-12-01 DIAGNOSIS — R1314 Dysphagia, pharyngoesophageal phase: Secondary | ICD-10-CM | POA: Diagnosis not present

## 2017-12-01 DIAGNOSIS — M9702XD Periprosthetic fracture around internal prosthetic left hip joint, subsequent encounter: Secondary | ICD-10-CM | POA: Diagnosis not present

## 2017-12-01 DIAGNOSIS — M6281 Muscle weakness (generalized): Secondary | ICD-10-CM | POA: Diagnosis not present

## 2017-12-01 DIAGNOSIS — R41841 Cognitive communication deficit: Secondary | ICD-10-CM | POA: Diagnosis not present

## 2017-12-02 DIAGNOSIS — M9702XD Periprosthetic fracture around internal prosthetic left hip joint, subsequent encounter: Secondary | ICD-10-CM | POA: Diagnosis not present

## 2017-12-02 DIAGNOSIS — M6281 Muscle weakness (generalized): Secondary | ICD-10-CM | POA: Diagnosis not present

## 2017-12-02 DIAGNOSIS — R262 Difficulty in walking, not elsewhere classified: Secondary | ICD-10-CM | POA: Diagnosis not present

## 2017-12-02 DIAGNOSIS — R1314 Dysphagia, pharyngoesophageal phase: Secondary | ICD-10-CM | POA: Diagnosis not present

## 2017-12-02 DIAGNOSIS — M25552 Pain in left hip: Secondary | ICD-10-CM | POA: Diagnosis not present

## 2017-12-02 DIAGNOSIS — R41841 Cognitive communication deficit: Secondary | ICD-10-CM | POA: Diagnosis not present

## 2017-12-02 DIAGNOSIS — S93115A Dislocation of interphalangeal joint of left lesser toe(s), initial encounter: Secondary | ICD-10-CM | POA: Diagnosis not present

## 2017-12-03 DIAGNOSIS — M9702XD Periprosthetic fracture around internal prosthetic left hip joint, subsequent encounter: Secondary | ICD-10-CM | POA: Diagnosis not present

## 2017-12-03 DIAGNOSIS — S93105D Unspecified dislocation of left toe(s), subsequent encounter: Secondary | ICD-10-CM | POA: Diagnosis not present

## 2017-12-03 DIAGNOSIS — M25552 Pain in left hip: Secondary | ICD-10-CM | POA: Diagnosis not present

## 2017-12-03 DIAGNOSIS — R41841 Cognitive communication deficit: Secondary | ICD-10-CM | POA: Diagnosis not present

## 2017-12-03 DIAGNOSIS — R1314 Dysphagia, pharyngoesophageal phase: Secondary | ICD-10-CM | POA: Diagnosis not present

## 2017-12-03 DIAGNOSIS — R262 Difficulty in walking, not elsewhere classified: Secondary | ICD-10-CM | POA: Diagnosis not present

## 2017-12-03 DIAGNOSIS — M6281 Muscle weakness (generalized): Secondary | ICD-10-CM | POA: Diagnosis not present

## 2017-12-04 DIAGNOSIS — R262 Difficulty in walking, not elsewhere classified: Secondary | ICD-10-CM | POA: Diagnosis not present

## 2017-12-04 DIAGNOSIS — M9702XD Periprosthetic fracture around internal prosthetic left hip joint, subsequent encounter: Secondary | ICD-10-CM | POA: Diagnosis not present

## 2017-12-04 DIAGNOSIS — R1314 Dysphagia, pharyngoesophageal phase: Secondary | ICD-10-CM | POA: Diagnosis not present

## 2017-12-04 DIAGNOSIS — M25552 Pain in left hip: Secondary | ICD-10-CM | POA: Diagnosis not present

## 2017-12-04 DIAGNOSIS — R41841 Cognitive communication deficit: Secondary | ICD-10-CM | POA: Diagnosis not present

## 2017-12-04 DIAGNOSIS — M6281 Muscle weakness (generalized): Secondary | ICD-10-CM | POA: Diagnosis not present

## 2017-12-05 DIAGNOSIS — M6281 Muscle weakness (generalized): Secondary | ICD-10-CM | POA: Diagnosis not present

## 2017-12-05 DIAGNOSIS — M9702XD Periprosthetic fracture around internal prosthetic left hip joint, subsequent encounter: Secondary | ICD-10-CM | POA: Diagnosis not present

## 2017-12-05 DIAGNOSIS — R41841 Cognitive communication deficit: Secondary | ICD-10-CM | POA: Diagnosis not present

## 2017-12-05 DIAGNOSIS — R262 Difficulty in walking, not elsewhere classified: Secondary | ICD-10-CM | POA: Diagnosis not present

## 2017-12-05 DIAGNOSIS — R1314 Dysphagia, pharyngoesophageal phase: Secondary | ICD-10-CM | POA: Diagnosis not present

## 2017-12-05 DIAGNOSIS — M25552 Pain in left hip: Secondary | ICD-10-CM | POA: Diagnosis not present

## 2017-12-07 DIAGNOSIS — R41841 Cognitive communication deficit: Secondary | ICD-10-CM | POA: Diagnosis not present

## 2017-12-07 DIAGNOSIS — M6281 Muscle weakness (generalized): Secondary | ICD-10-CM | POA: Diagnosis not present

## 2017-12-07 DIAGNOSIS — M9702XD Periprosthetic fracture around internal prosthetic left hip joint, subsequent encounter: Secondary | ICD-10-CM | POA: Diagnosis not present

## 2017-12-07 DIAGNOSIS — R1314 Dysphagia, pharyngoesophageal phase: Secondary | ICD-10-CM | POA: Diagnosis not present

## 2017-12-07 DIAGNOSIS — R262 Difficulty in walking, not elsewhere classified: Secondary | ICD-10-CM | POA: Diagnosis not present

## 2017-12-07 DIAGNOSIS — M25552 Pain in left hip: Secondary | ICD-10-CM | POA: Diagnosis not present

## 2017-12-08 DIAGNOSIS — R41841 Cognitive communication deficit: Secondary | ICD-10-CM | POA: Diagnosis not present

## 2017-12-08 DIAGNOSIS — M6281 Muscle weakness (generalized): Secondary | ICD-10-CM | POA: Diagnosis not present

## 2017-12-08 DIAGNOSIS — R1314 Dysphagia, pharyngoesophageal phase: Secondary | ICD-10-CM | POA: Diagnosis not present

## 2017-12-08 DIAGNOSIS — M9702XD Periprosthetic fracture around internal prosthetic left hip joint, subsequent encounter: Secondary | ICD-10-CM | POA: Diagnosis not present

## 2017-12-08 DIAGNOSIS — R262 Difficulty in walking, not elsewhere classified: Secondary | ICD-10-CM | POA: Diagnosis not present

## 2017-12-08 DIAGNOSIS — M25552 Pain in left hip: Secondary | ICD-10-CM | POA: Diagnosis not present

## 2017-12-09 DIAGNOSIS — M6281 Muscle weakness (generalized): Secondary | ICD-10-CM | POA: Diagnosis not present

## 2017-12-09 DIAGNOSIS — R1314 Dysphagia, pharyngoesophageal phase: Secondary | ICD-10-CM | POA: Diagnosis not present

## 2017-12-09 DIAGNOSIS — M9702XD Periprosthetic fracture around internal prosthetic left hip joint, subsequent encounter: Secondary | ICD-10-CM | POA: Diagnosis not present

## 2017-12-09 DIAGNOSIS — M25552 Pain in left hip: Secondary | ICD-10-CM | POA: Diagnosis not present

## 2017-12-09 DIAGNOSIS — R262 Difficulty in walking, not elsewhere classified: Secondary | ICD-10-CM | POA: Diagnosis not present

## 2017-12-09 DIAGNOSIS — R41841 Cognitive communication deficit: Secondary | ICD-10-CM | POA: Diagnosis not present

## 2017-12-10 DIAGNOSIS — I10 Essential (primary) hypertension: Secondary | ICD-10-CM | POA: Diagnosis not present

## 2017-12-10 DIAGNOSIS — M9702XD Periprosthetic fracture around internal prosthetic left hip joint, subsequent encounter: Secondary | ICD-10-CM | POA: Diagnosis not present

## 2017-12-10 DIAGNOSIS — M25552 Pain in left hip: Secondary | ICD-10-CM | POA: Diagnosis not present

## 2017-12-10 DIAGNOSIS — M069 Rheumatoid arthritis, unspecified: Secondary | ICD-10-CM | POA: Diagnosis not present

## 2017-12-10 DIAGNOSIS — R1314 Dysphagia, pharyngoesophageal phase: Secondary | ICD-10-CM | POA: Diagnosis not present

## 2017-12-10 DIAGNOSIS — M6281 Muscle weakness (generalized): Secondary | ICD-10-CM | POA: Diagnosis not present

## 2017-12-10 DIAGNOSIS — N400 Enlarged prostate without lower urinary tract symptoms: Secondary | ICD-10-CM | POA: Diagnosis not present

## 2017-12-10 DIAGNOSIS — R41841 Cognitive communication deficit: Secondary | ICD-10-CM | POA: Diagnosis not present

## 2017-12-10 DIAGNOSIS — M1990 Unspecified osteoarthritis, unspecified site: Secondary | ICD-10-CM | POA: Diagnosis not present

## 2017-12-10 DIAGNOSIS — R262 Difficulty in walking, not elsewhere classified: Secondary | ICD-10-CM | POA: Diagnosis not present

## 2017-12-11 DIAGNOSIS — M9702XD Periprosthetic fracture around internal prosthetic left hip joint, subsequent encounter: Secondary | ICD-10-CM | POA: Diagnosis not present

## 2017-12-11 DIAGNOSIS — M25552 Pain in left hip: Secondary | ICD-10-CM | POA: Diagnosis not present

## 2017-12-11 DIAGNOSIS — R41841 Cognitive communication deficit: Secondary | ICD-10-CM | POA: Diagnosis not present

## 2017-12-11 DIAGNOSIS — M6281 Muscle weakness (generalized): Secondary | ICD-10-CM | POA: Diagnosis not present

## 2017-12-11 DIAGNOSIS — R1314 Dysphagia, pharyngoesophageal phase: Secondary | ICD-10-CM | POA: Diagnosis not present

## 2017-12-11 DIAGNOSIS — R262 Difficulty in walking, not elsewhere classified: Secondary | ICD-10-CM | POA: Diagnosis not present

## 2017-12-14 DIAGNOSIS — M6281 Muscle weakness (generalized): Secondary | ICD-10-CM | POA: Diagnosis not present

## 2017-12-14 DIAGNOSIS — R1314 Dysphagia, pharyngoesophageal phase: Secondary | ICD-10-CM | POA: Diagnosis not present

## 2017-12-14 DIAGNOSIS — R262 Difficulty in walking, not elsewhere classified: Secondary | ICD-10-CM | POA: Diagnosis not present

## 2017-12-14 DIAGNOSIS — R41841 Cognitive communication deficit: Secondary | ICD-10-CM | POA: Diagnosis not present

## 2017-12-14 DIAGNOSIS — M9702XD Periprosthetic fracture around internal prosthetic left hip joint, subsequent encounter: Secondary | ICD-10-CM | POA: Diagnosis not present

## 2017-12-14 DIAGNOSIS — M25552 Pain in left hip: Secondary | ICD-10-CM | POA: Diagnosis not present

## 2017-12-15 DIAGNOSIS — M6281 Muscle weakness (generalized): Secondary | ICD-10-CM | POA: Diagnosis not present

## 2017-12-15 DIAGNOSIS — R41841 Cognitive communication deficit: Secondary | ICD-10-CM | POA: Diagnosis not present

## 2017-12-15 DIAGNOSIS — R262 Difficulty in walking, not elsewhere classified: Secondary | ICD-10-CM | POA: Diagnosis not present

## 2017-12-15 DIAGNOSIS — M9702XD Periprosthetic fracture around internal prosthetic left hip joint, subsequent encounter: Secondary | ICD-10-CM | POA: Diagnosis not present

## 2017-12-15 DIAGNOSIS — R1314 Dysphagia, pharyngoesophageal phase: Secondary | ICD-10-CM | POA: Diagnosis not present

## 2017-12-16 DIAGNOSIS — R1314 Dysphagia, pharyngoesophageal phase: Secondary | ICD-10-CM | POA: Diagnosis not present

## 2017-12-16 DIAGNOSIS — M6281 Muscle weakness (generalized): Secondary | ICD-10-CM | POA: Diagnosis not present

## 2017-12-16 DIAGNOSIS — R262 Difficulty in walking, not elsewhere classified: Secondary | ICD-10-CM | POA: Diagnosis not present

## 2017-12-16 DIAGNOSIS — B351 Tinea unguium: Secondary | ICD-10-CM | POA: Diagnosis not present

## 2017-12-16 DIAGNOSIS — R41841 Cognitive communication deficit: Secondary | ICD-10-CM | POA: Diagnosis not present

## 2017-12-16 DIAGNOSIS — M9702XD Periprosthetic fracture around internal prosthetic left hip joint, subsequent encounter: Secondary | ICD-10-CM | POA: Diagnosis not present

## 2017-12-17 DIAGNOSIS — R1314 Dysphagia, pharyngoesophageal phase: Secondary | ICD-10-CM | POA: Diagnosis not present

## 2017-12-17 DIAGNOSIS — M9702XD Periprosthetic fracture around internal prosthetic left hip joint, subsequent encounter: Secondary | ICD-10-CM | POA: Diagnosis not present

## 2017-12-17 DIAGNOSIS — R262 Difficulty in walking, not elsewhere classified: Secondary | ICD-10-CM | POA: Diagnosis not present

## 2017-12-17 DIAGNOSIS — M6281 Muscle weakness (generalized): Secondary | ICD-10-CM | POA: Diagnosis not present

## 2017-12-17 DIAGNOSIS — R41841 Cognitive communication deficit: Secondary | ICD-10-CM | POA: Diagnosis not present

## 2017-12-18 DIAGNOSIS — M9702XD Periprosthetic fracture around internal prosthetic left hip joint, subsequent encounter: Secondary | ICD-10-CM | POA: Diagnosis not present

## 2017-12-18 DIAGNOSIS — R41841 Cognitive communication deficit: Secondary | ICD-10-CM | POA: Diagnosis not present

## 2017-12-18 DIAGNOSIS — M6281 Muscle weakness (generalized): Secondary | ICD-10-CM | POA: Diagnosis not present

## 2017-12-18 DIAGNOSIS — R262 Difficulty in walking, not elsewhere classified: Secondary | ICD-10-CM | POA: Diagnosis not present

## 2017-12-18 DIAGNOSIS — R1314 Dysphagia, pharyngoesophageal phase: Secondary | ICD-10-CM | POA: Diagnosis not present

## 2017-12-19 DIAGNOSIS — R41841 Cognitive communication deficit: Secondary | ICD-10-CM | POA: Diagnosis not present

## 2017-12-19 DIAGNOSIS — R262 Difficulty in walking, not elsewhere classified: Secondary | ICD-10-CM | POA: Diagnosis not present

## 2017-12-19 DIAGNOSIS — M9702XD Periprosthetic fracture around internal prosthetic left hip joint, subsequent encounter: Secondary | ICD-10-CM | POA: Diagnosis not present

## 2017-12-19 DIAGNOSIS — R1314 Dysphagia, pharyngoesophageal phase: Secondary | ICD-10-CM | POA: Diagnosis not present

## 2017-12-19 DIAGNOSIS — M6281 Muscle weakness (generalized): Secondary | ICD-10-CM | POA: Diagnosis not present

## 2017-12-21 DIAGNOSIS — R1314 Dysphagia, pharyngoesophageal phase: Secondary | ICD-10-CM | POA: Diagnosis not present

## 2017-12-21 DIAGNOSIS — S72112A Displaced fracture of greater trochanter of left femur, initial encounter for closed fracture: Secondary | ICD-10-CM | POA: Diagnosis not present

## 2017-12-21 DIAGNOSIS — R41841 Cognitive communication deficit: Secondary | ICD-10-CM | POA: Diagnosis not present

## 2017-12-21 DIAGNOSIS — M9702XA Periprosthetic fracture around internal prosthetic left hip joint, initial encounter: Secondary | ICD-10-CM | POA: Diagnosis not present

## 2017-12-21 DIAGNOSIS — M6281 Muscle weakness (generalized): Secondary | ICD-10-CM | POA: Diagnosis not present

## 2017-12-21 DIAGNOSIS — R262 Difficulty in walking, not elsewhere classified: Secondary | ICD-10-CM | POA: Diagnosis not present

## 2017-12-21 DIAGNOSIS — M9702XD Periprosthetic fracture around internal prosthetic left hip joint, subsequent encounter: Secondary | ICD-10-CM | POA: Diagnosis not present

## 2017-12-22 DIAGNOSIS — M6281 Muscle weakness (generalized): Secondary | ICD-10-CM | POA: Diagnosis not present

## 2017-12-22 DIAGNOSIS — R41841 Cognitive communication deficit: Secondary | ICD-10-CM | POA: Diagnosis not present

## 2017-12-22 DIAGNOSIS — R262 Difficulty in walking, not elsewhere classified: Secondary | ICD-10-CM | POA: Diagnosis not present

## 2017-12-22 DIAGNOSIS — M9702XD Periprosthetic fracture around internal prosthetic left hip joint, subsequent encounter: Secondary | ICD-10-CM | POA: Diagnosis not present

## 2017-12-22 DIAGNOSIS — R1314 Dysphagia, pharyngoesophageal phase: Secondary | ICD-10-CM | POA: Diagnosis not present

## 2017-12-23 DIAGNOSIS — M9702XD Periprosthetic fracture around internal prosthetic left hip joint, subsequent encounter: Secondary | ICD-10-CM | POA: Diagnosis not present

## 2017-12-23 DIAGNOSIS — R1314 Dysphagia, pharyngoesophageal phase: Secondary | ICD-10-CM | POA: Diagnosis not present

## 2017-12-23 DIAGNOSIS — R262 Difficulty in walking, not elsewhere classified: Secondary | ICD-10-CM | POA: Diagnosis not present

## 2017-12-23 DIAGNOSIS — M6281 Muscle weakness (generalized): Secondary | ICD-10-CM | POA: Diagnosis not present

## 2017-12-23 DIAGNOSIS — Z23 Encounter for immunization: Secondary | ICD-10-CM | POA: Diagnosis not present

## 2017-12-23 DIAGNOSIS — R41841 Cognitive communication deficit: Secondary | ICD-10-CM | POA: Diagnosis not present

## 2017-12-24 DIAGNOSIS — M9702XD Periprosthetic fracture around internal prosthetic left hip joint, subsequent encounter: Secondary | ICD-10-CM | POA: Diagnosis not present

## 2017-12-24 DIAGNOSIS — R1314 Dysphagia, pharyngoesophageal phase: Secondary | ICD-10-CM | POA: Diagnosis not present

## 2017-12-24 DIAGNOSIS — M6281 Muscle weakness (generalized): Secondary | ICD-10-CM | POA: Diagnosis not present

## 2017-12-24 DIAGNOSIS — R262 Difficulty in walking, not elsewhere classified: Secondary | ICD-10-CM | POA: Diagnosis not present

## 2017-12-24 DIAGNOSIS — R41841 Cognitive communication deficit: Secondary | ICD-10-CM | POA: Diagnosis not present

## 2017-12-25 DIAGNOSIS — M9702XD Periprosthetic fracture around internal prosthetic left hip joint, subsequent encounter: Secondary | ICD-10-CM | POA: Diagnosis not present

## 2017-12-25 DIAGNOSIS — R41841 Cognitive communication deficit: Secondary | ICD-10-CM | POA: Diagnosis not present

## 2017-12-25 DIAGNOSIS — M6281 Muscle weakness (generalized): Secondary | ICD-10-CM | POA: Diagnosis not present

## 2017-12-25 DIAGNOSIS — R262 Difficulty in walking, not elsewhere classified: Secondary | ICD-10-CM | POA: Diagnosis not present

## 2017-12-25 DIAGNOSIS — R1314 Dysphagia, pharyngoesophageal phase: Secondary | ICD-10-CM | POA: Diagnosis not present

## 2017-12-26 DIAGNOSIS — R262 Difficulty in walking, not elsewhere classified: Secondary | ICD-10-CM | POA: Diagnosis not present

## 2017-12-26 DIAGNOSIS — R41841 Cognitive communication deficit: Secondary | ICD-10-CM | POA: Diagnosis not present

## 2017-12-26 DIAGNOSIS — M9702XD Periprosthetic fracture around internal prosthetic left hip joint, subsequent encounter: Secondary | ICD-10-CM | POA: Diagnosis not present

## 2017-12-26 DIAGNOSIS — R1314 Dysphagia, pharyngoesophageal phase: Secondary | ICD-10-CM | POA: Diagnosis not present

## 2017-12-26 DIAGNOSIS — M6281 Muscle weakness (generalized): Secondary | ICD-10-CM | POA: Diagnosis not present

## 2017-12-28 DIAGNOSIS — R41841 Cognitive communication deficit: Secondary | ICD-10-CM | POA: Diagnosis not present

## 2017-12-28 DIAGNOSIS — R1314 Dysphagia, pharyngoesophageal phase: Secondary | ICD-10-CM | POA: Diagnosis not present

## 2017-12-28 DIAGNOSIS — M9702XD Periprosthetic fracture around internal prosthetic left hip joint, subsequent encounter: Secondary | ICD-10-CM | POA: Diagnosis not present

## 2017-12-28 DIAGNOSIS — R262 Difficulty in walking, not elsewhere classified: Secondary | ICD-10-CM | POA: Diagnosis not present

## 2017-12-28 DIAGNOSIS — M6281 Muscle weakness (generalized): Secondary | ICD-10-CM | POA: Diagnosis not present

## 2017-12-29 DIAGNOSIS — R569 Unspecified convulsions: Secondary | ICD-10-CM | POA: Diagnosis not present

## 2017-12-29 DIAGNOSIS — R262 Difficulty in walking, not elsewhere classified: Secondary | ICD-10-CM | POA: Diagnosis not present

## 2017-12-29 DIAGNOSIS — R41841 Cognitive communication deficit: Secondary | ICD-10-CM | POA: Diagnosis not present

## 2017-12-29 DIAGNOSIS — Z5181 Encounter for therapeutic drug level monitoring: Secondary | ICD-10-CM | POA: Diagnosis not present

## 2017-12-29 DIAGNOSIS — R1314 Dysphagia, pharyngoesophageal phase: Secondary | ICD-10-CM | POA: Diagnosis not present

## 2017-12-29 DIAGNOSIS — E785 Hyperlipidemia, unspecified: Secondary | ICD-10-CM | POA: Diagnosis not present

## 2017-12-29 DIAGNOSIS — D649 Anemia, unspecified: Secondary | ICD-10-CM | POA: Diagnosis not present

## 2017-12-29 DIAGNOSIS — M6281 Muscle weakness (generalized): Secondary | ICD-10-CM | POA: Diagnosis not present

## 2017-12-29 DIAGNOSIS — I10 Essential (primary) hypertension: Secondary | ICD-10-CM | POA: Diagnosis not present

## 2017-12-29 DIAGNOSIS — M9702XD Periprosthetic fracture around internal prosthetic left hip joint, subsequent encounter: Secondary | ICD-10-CM | POA: Diagnosis not present

## 2017-12-30 DIAGNOSIS — R1314 Dysphagia, pharyngoesophageal phase: Secondary | ICD-10-CM | POA: Diagnosis not present

## 2017-12-30 DIAGNOSIS — R262 Difficulty in walking, not elsewhere classified: Secondary | ICD-10-CM | POA: Diagnosis not present

## 2017-12-30 DIAGNOSIS — M6281 Muscle weakness (generalized): Secondary | ICD-10-CM | POA: Diagnosis not present

## 2017-12-30 DIAGNOSIS — M9702XD Periprosthetic fracture around internal prosthetic left hip joint, subsequent encounter: Secondary | ICD-10-CM | POA: Diagnosis not present

## 2017-12-30 DIAGNOSIS — R41841 Cognitive communication deficit: Secondary | ICD-10-CM | POA: Diagnosis not present

## 2017-12-31 DIAGNOSIS — R41841 Cognitive communication deficit: Secondary | ICD-10-CM | POA: Diagnosis not present

## 2017-12-31 DIAGNOSIS — M6281 Muscle weakness (generalized): Secondary | ICD-10-CM | POA: Diagnosis not present

## 2017-12-31 DIAGNOSIS — R1314 Dysphagia, pharyngoesophageal phase: Secondary | ICD-10-CM | POA: Diagnosis not present

## 2017-12-31 DIAGNOSIS — M9702XD Periprosthetic fracture around internal prosthetic left hip joint, subsequent encounter: Secondary | ICD-10-CM | POA: Diagnosis not present

## 2017-12-31 DIAGNOSIS — R262 Difficulty in walking, not elsewhere classified: Secondary | ICD-10-CM | POA: Diagnosis not present

## 2018-01-01 DIAGNOSIS — M6281 Muscle weakness (generalized): Secondary | ICD-10-CM | POA: Diagnosis not present

## 2018-01-01 DIAGNOSIS — R1314 Dysphagia, pharyngoesophageal phase: Secondary | ICD-10-CM | POA: Diagnosis not present

## 2018-01-01 DIAGNOSIS — R262 Difficulty in walking, not elsewhere classified: Secondary | ICD-10-CM | POA: Diagnosis not present

## 2018-01-01 DIAGNOSIS — M9702XD Periprosthetic fracture around internal prosthetic left hip joint, subsequent encounter: Secondary | ICD-10-CM | POA: Diagnosis not present

## 2018-01-01 DIAGNOSIS — R41841 Cognitive communication deficit: Secondary | ICD-10-CM | POA: Diagnosis not present

## 2018-01-02 DIAGNOSIS — R41841 Cognitive communication deficit: Secondary | ICD-10-CM | POA: Diagnosis not present

## 2018-01-02 DIAGNOSIS — M9702XD Periprosthetic fracture around internal prosthetic left hip joint, subsequent encounter: Secondary | ICD-10-CM | POA: Diagnosis not present

## 2018-01-02 DIAGNOSIS — R1314 Dysphagia, pharyngoesophageal phase: Secondary | ICD-10-CM | POA: Diagnosis not present

## 2018-01-02 DIAGNOSIS — M6281 Muscle weakness (generalized): Secondary | ICD-10-CM | POA: Diagnosis not present

## 2018-01-02 DIAGNOSIS — R262 Difficulty in walking, not elsewhere classified: Secondary | ICD-10-CM | POA: Diagnosis not present

## 2018-01-04 DIAGNOSIS — R41841 Cognitive communication deficit: Secondary | ICD-10-CM | POA: Diagnosis not present

## 2018-01-04 DIAGNOSIS — N400 Enlarged prostate without lower urinary tract symptoms: Secondary | ICD-10-CM | POA: Diagnosis not present

## 2018-01-04 DIAGNOSIS — M9702XD Periprosthetic fracture around internal prosthetic left hip joint, subsequent encounter: Secondary | ICD-10-CM | POA: Diagnosis not present

## 2018-01-04 DIAGNOSIS — R1314 Dysphagia, pharyngoesophageal phase: Secondary | ICD-10-CM | POA: Diagnosis not present

## 2018-01-04 DIAGNOSIS — I10 Essential (primary) hypertension: Secondary | ICD-10-CM | POA: Diagnosis not present

## 2018-01-04 DIAGNOSIS — L97521 Non-pressure chronic ulcer of other part of left foot limited to breakdown of skin: Secondary | ICD-10-CM | POA: Diagnosis not present

## 2018-01-04 DIAGNOSIS — M6281 Muscle weakness (generalized): Secondary | ICD-10-CM | POA: Diagnosis not present

## 2018-01-04 DIAGNOSIS — R262 Difficulty in walking, not elsewhere classified: Secondary | ICD-10-CM | POA: Diagnosis not present

## 2018-01-04 DIAGNOSIS — E1142 Type 2 diabetes mellitus with diabetic polyneuropathy: Secondary | ICD-10-CM | POA: Diagnosis not present

## 2018-01-05 DIAGNOSIS — R41841 Cognitive communication deficit: Secondary | ICD-10-CM | POA: Diagnosis not present

## 2018-01-05 DIAGNOSIS — M9702XD Periprosthetic fracture around internal prosthetic left hip joint, subsequent encounter: Secondary | ICD-10-CM | POA: Diagnosis not present

## 2018-01-05 DIAGNOSIS — R1314 Dysphagia, pharyngoesophageal phase: Secondary | ICD-10-CM | POA: Diagnosis not present

## 2018-01-05 DIAGNOSIS — M6281 Muscle weakness (generalized): Secondary | ICD-10-CM | POA: Diagnosis not present

## 2018-01-05 DIAGNOSIS — R262 Difficulty in walking, not elsewhere classified: Secondary | ICD-10-CM | POA: Diagnosis not present

## 2018-01-06 DIAGNOSIS — R262 Difficulty in walking, not elsewhere classified: Secondary | ICD-10-CM | POA: Diagnosis not present

## 2018-01-06 DIAGNOSIS — R41841 Cognitive communication deficit: Secondary | ICD-10-CM | POA: Diagnosis not present

## 2018-01-06 DIAGNOSIS — M9702XD Periprosthetic fracture around internal prosthetic left hip joint, subsequent encounter: Secondary | ICD-10-CM | POA: Diagnosis not present

## 2018-01-06 DIAGNOSIS — M6281 Muscle weakness (generalized): Secondary | ICD-10-CM | POA: Diagnosis not present

## 2018-01-06 DIAGNOSIS — R1314 Dysphagia, pharyngoesophageal phase: Secondary | ICD-10-CM | POA: Diagnosis not present

## 2018-01-06 DIAGNOSIS — Z79899 Other long term (current) drug therapy: Secondary | ICD-10-CM | POA: Diagnosis not present

## 2018-01-07 DIAGNOSIS — R262 Difficulty in walking, not elsewhere classified: Secondary | ICD-10-CM | POA: Diagnosis not present

## 2018-01-07 DIAGNOSIS — R1314 Dysphagia, pharyngoesophageal phase: Secondary | ICD-10-CM | POA: Diagnosis not present

## 2018-01-07 DIAGNOSIS — M6281 Muscle weakness (generalized): Secondary | ICD-10-CM | POA: Diagnosis not present

## 2018-01-07 DIAGNOSIS — R41841 Cognitive communication deficit: Secondary | ICD-10-CM | POA: Diagnosis not present

## 2018-01-07 DIAGNOSIS — M9702XD Periprosthetic fracture around internal prosthetic left hip joint, subsequent encounter: Secondary | ICD-10-CM | POA: Diagnosis not present

## 2018-01-08 DIAGNOSIS — R41841 Cognitive communication deficit: Secondary | ICD-10-CM | POA: Diagnosis not present

## 2018-01-08 DIAGNOSIS — M6281 Muscle weakness (generalized): Secondary | ICD-10-CM | POA: Diagnosis not present

## 2018-01-08 DIAGNOSIS — M9702XD Periprosthetic fracture around internal prosthetic left hip joint, subsequent encounter: Secondary | ICD-10-CM | POA: Diagnosis not present

## 2018-01-08 DIAGNOSIS — R1314 Dysphagia, pharyngoesophageal phase: Secondary | ICD-10-CM | POA: Diagnosis not present

## 2018-01-08 DIAGNOSIS — R262 Difficulty in walking, not elsewhere classified: Secondary | ICD-10-CM | POA: Diagnosis not present

## 2018-01-11 DIAGNOSIS — R262 Difficulty in walking, not elsewhere classified: Secondary | ICD-10-CM | POA: Diagnosis not present

## 2018-01-11 DIAGNOSIS — M6281 Muscle weakness (generalized): Secondary | ICD-10-CM | POA: Diagnosis not present

## 2018-01-11 DIAGNOSIS — R1314 Dysphagia, pharyngoesophageal phase: Secondary | ICD-10-CM | POA: Diagnosis not present

## 2018-01-11 DIAGNOSIS — R41841 Cognitive communication deficit: Secondary | ICD-10-CM | POA: Diagnosis not present

## 2018-01-11 DIAGNOSIS — M9702XD Periprosthetic fracture around internal prosthetic left hip joint, subsequent encounter: Secondary | ICD-10-CM | POA: Diagnosis not present

## 2018-01-12 DIAGNOSIS — R1314 Dysphagia, pharyngoesophageal phase: Secondary | ICD-10-CM | POA: Diagnosis not present

## 2018-01-12 DIAGNOSIS — M9702XD Periprosthetic fracture around internal prosthetic left hip joint, subsequent encounter: Secondary | ICD-10-CM | POA: Diagnosis not present

## 2018-01-12 DIAGNOSIS — R262 Difficulty in walking, not elsewhere classified: Secondary | ICD-10-CM | POA: Diagnosis not present

## 2018-01-12 DIAGNOSIS — R41841 Cognitive communication deficit: Secondary | ICD-10-CM | POA: Diagnosis not present

## 2018-01-12 DIAGNOSIS — M6281 Muscle weakness (generalized): Secondary | ICD-10-CM | POA: Diagnosis not present

## 2018-01-13 DIAGNOSIS — M6281 Muscle weakness (generalized): Secondary | ICD-10-CM | POA: Diagnosis not present

## 2018-01-13 DIAGNOSIS — M9702XD Periprosthetic fracture around internal prosthetic left hip joint, subsequent encounter: Secondary | ICD-10-CM | POA: Diagnosis not present

## 2018-01-13 DIAGNOSIS — R41841 Cognitive communication deficit: Secondary | ICD-10-CM | POA: Diagnosis not present

## 2018-01-13 DIAGNOSIS — R262 Difficulty in walking, not elsewhere classified: Secondary | ICD-10-CM | POA: Diagnosis not present

## 2018-01-13 DIAGNOSIS — R1314 Dysphagia, pharyngoesophageal phase: Secondary | ICD-10-CM | POA: Diagnosis not present

## 2018-01-14 DIAGNOSIS — R41841 Cognitive communication deficit: Secondary | ICD-10-CM | POA: Diagnosis not present

## 2018-01-14 DIAGNOSIS — R262 Difficulty in walking, not elsewhere classified: Secondary | ICD-10-CM | POA: Diagnosis not present

## 2018-01-14 DIAGNOSIS — M6281 Muscle weakness (generalized): Secondary | ICD-10-CM | POA: Diagnosis not present

## 2018-01-14 DIAGNOSIS — M9702XD Periprosthetic fracture around internal prosthetic left hip joint, subsequent encounter: Secondary | ICD-10-CM | POA: Diagnosis not present

## 2018-01-14 DIAGNOSIS — R1314 Dysphagia, pharyngoesophageal phase: Secondary | ICD-10-CM | POA: Diagnosis not present

## 2018-01-15 DIAGNOSIS — M9702XD Periprosthetic fracture around internal prosthetic left hip joint, subsequent encounter: Secondary | ICD-10-CM | POA: Diagnosis not present

## 2018-01-15 DIAGNOSIS — R262 Difficulty in walking, not elsewhere classified: Secondary | ICD-10-CM | POA: Diagnosis not present

## 2018-01-15 DIAGNOSIS — R1314 Dysphagia, pharyngoesophageal phase: Secondary | ICD-10-CM | POA: Diagnosis not present

## 2018-01-15 DIAGNOSIS — M6281 Muscle weakness (generalized): Secondary | ICD-10-CM | POA: Diagnosis not present

## 2018-01-15 DIAGNOSIS — R41841 Cognitive communication deficit: Secondary | ICD-10-CM | POA: Diagnosis not present

## 2018-01-18 DIAGNOSIS — F411 Generalized anxiety disorder: Secondary | ICD-10-CM | POA: Diagnosis not present

## 2018-01-18 DIAGNOSIS — R1314 Dysphagia, pharyngoesophageal phase: Secondary | ICD-10-CM | POA: Diagnosis not present

## 2018-01-18 DIAGNOSIS — F333 Major depressive disorder, recurrent, severe with psychotic symptoms: Secondary | ICD-10-CM | POA: Diagnosis not present

## 2018-01-18 DIAGNOSIS — R262 Difficulty in walking, not elsewhere classified: Secondary | ICD-10-CM | POA: Diagnosis not present

## 2018-01-18 DIAGNOSIS — F0150 Vascular dementia without behavioral disturbance: Secondary | ICD-10-CM | POA: Diagnosis not present

## 2018-01-18 DIAGNOSIS — M6281 Muscle weakness (generalized): Secondary | ICD-10-CM | POA: Diagnosis not present

## 2018-01-18 DIAGNOSIS — R41841 Cognitive communication deficit: Secondary | ICD-10-CM | POA: Diagnosis not present

## 2018-01-18 DIAGNOSIS — M9702XD Periprosthetic fracture around internal prosthetic left hip joint, subsequent encounter: Secondary | ICD-10-CM | POA: Diagnosis not present

## 2018-01-19 DIAGNOSIS — R41841 Cognitive communication deficit: Secondary | ICD-10-CM | POA: Diagnosis not present

## 2018-01-19 DIAGNOSIS — R262 Difficulty in walking, not elsewhere classified: Secondary | ICD-10-CM | POA: Diagnosis not present

## 2018-01-19 DIAGNOSIS — M6281 Muscle weakness (generalized): Secondary | ICD-10-CM | POA: Diagnosis not present

## 2018-01-19 DIAGNOSIS — M9702XD Periprosthetic fracture around internal prosthetic left hip joint, subsequent encounter: Secondary | ICD-10-CM | POA: Diagnosis not present

## 2018-01-19 DIAGNOSIS — R1314 Dysphagia, pharyngoesophageal phase: Secondary | ICD-10-CM | POA: Diagnosis not present

## 2018-01-20 ENCOUNTER — Other Ambulatory Visit: Payer: Self-pay

## 2018-01-20 ENCOUNTER — Telehealth: Payer: Self-pay | Admitting: *Deleted

## 2018-01-20 ENCOUNTER — Ambulatory Visit (INDEPENDENT_AMBULATORY_CARE_PROVIDER_SITE_OTHER)
Admission: RE | Admit: 2018-01-20 | Discharge: 2018-01-20 | Disposition: A | Discharge status: Home / Self Care | Payer: Medicare Other | Source: Ambulatory Visit | Attending: Vascular Surgery | Admitting: Vascular Surgery

## 2018-01-20 ENCOUNTER — Other Ambulatory Visit: Payer: Self-pay | Admitting: *Deleted

## 2018-01-20 ENCOUNTER — Ambulatory Visit (HOSPITAL_COMMUNITY)
Admission: RE | Admit: 2018-01-20 | Discharge: 2018-01-20 | Disposition: A | Discharge status: Home / Self Care | Payer: Medicare Other | Source: Ambulatory Visit | Attending: Vascular Surgery | Admitting: Vascular Surgery

## 2018-01-20 ENCOUNTER — Encounter: Payer: Self-pay | Admitting: *Deleted

## 2018-01-20 ENCOUNTER — Ambulatory Visit (INDEPENDENT_AMBULATORY_CARE_PROVIDER_SITE_OTHER): Payer: Medicare Other | Admitting: Vascular Surgery

## 2018-01-20 ENCOUNTER — Encounter: Payer: Self-pay | Admitting: Vascular Surgery

## 2018-01-20 VITALS — BP 138/68 | HR 66 | Temp 100.2°F | Resp 22 | Ht 73.0 in | Wt 175.0 lb

## 2018-01-20 DIAGNOSIS — I70245 Atherosclerosis of native arteries of left leg with ulceration of other part of foot: Secondary | ICD-10-CM

## 2018-01-20 DIAGNOSIS — I739 Peripheral vascular disease, unspecified: Secondary | ICD-10-CM

## 2018-01-20 DIAGNOSIS — R262 Difficulty in walking, not elsewhere classified: Secondary | ICD-10-CM | POA: Diagnosis not present

## 2018-01-20 DIAGNOSIS — R1314 Dysphagia, pharyngoesophageal phase: Secondary | ICD-10-CM | POA: Diagnosis not present

## 2018-01-20 DIAGNOSIS — M9702XD Periprosthetic fracture around internal prosthetic left hip joint, subsequent encounter: Secondary | ICD-10-CM | POA: Diagnosis not present

## 2018-01-20 DIAGNOSIS — M6281 Muscle weakness (generalized): Secondary | ICD-10-CM | POA: Diagnosis not present

## 2018-01-20 DIAGNOSIS — R41841 Cognitive communication deficit: Secondary | ICD-10-CM | POA: Diagnosis not present

## 2018-01-20 NOTE — H&P (View-Only) (Signed)
Patient name: Terry Green MRN: [id number] DOB: [birth year] Sex: male  REASON FOR VISIT:   Follow-up of peripheral vascular disease.  HPI:   Terry Green is a pleasant 77 y.o. male who was last seen in our office by Dr. Fabienne Bruns on 10/29/2017.  He is undergone a previous left popliteal to peroneal artery bypass in June 2018.  The wounds at that time healed.  When he was seen on 10/29/2017 he had developed new wounds on his toes of the left foot.  He was started on Levaquin.  He was to return in 2 weeks and was to get a duplex and ABIs at that time.  He returns now for a follow-up visit.  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  He has been getting physical therapy trying to walk with the prosthesis.  Since he was seen last there is been no significant improvement on the wounds of his left foot.  He denies significant pain associated with this.  He did have a culture which grew staph aureus.  He is currently being treated with po Levaquin.  Past Medical History:  Diagnosis Date  . Abnormality of gait and mobility   . Anemia    iron deficiency  . Anxiety   . Arthritis    "hands" (06/06/2014)  . Bipolar disorder (HCC)   . BPH (benign prostatic hyperplasia)   . Coronary artery disease   . Dementia (HCC)   . Depression   . 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 Generalized weakness   . Hypercholesteremia   . Hypertension   . PAD (peripheral artery disease) (HCC)   . Peripheral vascular disease (HCC)   . Psychosis (HCC)   . Rheumatoid arthritis (HCC)   . Type II diabetes mellitus (HCC)    Type 2    Family History  Problem Relation Age of Onset  . Heart disease Father     SOCIAL HISTORY: Social History   Tobacco Use  . Smoking status: Former Smoker    Packs/day: 1.00    Years: 54.00    Pack years: 54.00    Types: Cigarettes    Last attempt to quit: 05/25/2012    Years since quitting: 5.6  . Smokeless tobacco: Never  Used  Substance Use Topics  . Alcohol use: Yes    Alcohol/week: 0.0 standard drinks    Comment: 06/06/2014 "I drank 1/2 pint whiskey each day;  nothing in 20 yrs"    Allergies  Allergen Reactions  . Lisinopril Swelling    SWELLING REACTION UNSPECIFIED     Current Outpatient Medications  Medication Sig Dispense Refill  . acetaminophen (TYLENOL) 500 MG tablet Take 1,000 mg by mouth every 8 (eight) hours as needed for moderate pain.    Marland Kitchen alum & mag hydroxide-simeth (MYLANTA) 200-200-20 MG/5ML suspension Take 15 mLs by mouth every 6 (six) hours as needed for indigestion or heartburn.    Marland Kitchen amLODipine (NORVASC) 5 MG tablet Take 5 mg by mouth daily.    Marland Kitchen aspirin EC 81 MG tablet Take 81 mg by mouth daily.    . Benzocaine-Menthol (CHLORASEPTIC SORE THROAT MT) Use as directed 1 spray in the mouth or throat every 2 (two) hours as needed (sore throat).     . busPIRone (BUSPAR) 15 MG tablet Take 15 mg by mouth every evening.    . carvedilol (COREG) 12.5 MG tablet Take 12.5 mg by mouth 2 (two) times daily with a  meal.    . clopidogrel (PLAVIX) 75 MG tablet Take 75 mg by mouth daily.    . divalproex (DEPAKOTE ER) 500 MG 24 hr tablet Take 500 mg by mouth 2 (two) times daily.     Marland Kitchen gabapentin (NEURONTIN) 300 MG capsule Take 300 mg by mouth 3 (three) times daily.    Marland Kitchen guaiFENesin-dextromethorphan (ROBITUSSIN DM) 100-10 MG/5ML syrup Take 10 mLs by mouth every 4 (four) hours as needed for cough.    Marland Kitchen HYDROcodone-acetaminophen (NORCO) 7.5-325 MG tablet Take 1-2 tablets by mouth every 4 (four) hours as needed for moderate pain. 30 tablet 0  . insulin aspart (NOVOLOG) 100 UNIT/ML FlexPen Inject 0-20 Units into the skin 2 (two) times daily. 6:30am and 4pm per sliding scale:  CBG 150-249 3 units, 250-349 5 units, 350-449 8 units, 450-549 14 units, 550-551 20 units >551 call MD    . Insulin Glargine (LANTUS) 100 UNIT/ML Solostar Pen Inject 15 Units into the skin at bedtime.     . Insulin Isophane & Regular Human  (HUMULIN 70/30 KWIKPEN) (70-30) 100 UNIT/ML PEN Inject into the skin.    Marland Kitchen levofloxacin (LEVAQUIN) 500 MG tablet Take 1 tablet (500 mg total) by mouth daily. 14 tablet 0  . loratadine (CLARITIN) 10 MG tablet Take 10 mg by mouth daily.    . memantine (NAMENDA) 10 MG tablet Take 10 mg by mouth 2 (two) times daily.    . metFORMIN (GLUCOPHAGE) 1000 MG tablet Take 1,000 mg by mouth 2 (two) times daily with a meal.     . Multiple Vitamins-Minerals (MULTIVITAMIN WITH MINERALS) tablet Take 1 tablet by mouth daily.    Marland Kitchen oxybutynin (DITROPAN-XL) 10 MG 24 hr tablet Take 10 mg by mouth at bedtime.    . risperiDONE (RISPERDAL) 0.5 MG tablet Take 0.5 mg by mouth at bedtime.    . sertraline (ZOLOFT) 100 MG tablet Take 100 mg by mouth daily.    . Sodium Chloride Flush (NORMAL SALINE FLUSH IV) Inject into the vein.    . tamsulosin (FLOMAX) 0.4 MG CAPS capsule Take 0.4 mg by mouth at bedtime.     . vancomycin 1,000 mg in sodium chloride 0.9 % 250 mL Inject 1,000 mg into the vein every 12 (twelve) hours. For infection to left foot. Start date: 08/10/16    . vitamin C (ASCORBIC ACID) 500 MG tablet Take 500 mg by mouth 2 (two) times daily.    Marland Kitchen atorvastatin (LIPITOR) 10 MG tablet Take 1 tablet (10 mg total) by mouth daily. 30 tablet 11   No current facility-administered medications for this visit.     REVIEW OF SYSTEMS:  [X]  denotes positive finding, [ ]  denotes negative finding Cardiac  Comments:  Chest pain or chest pressure:    Shortness of breath upon exertion:    Short of breath when lying flat:    Irregular heart rhythm:        Vascular    Pain in calf, thigh, or hip brought on by ambulation:    Pain in feet at night that wakes you up from your sleep:     Blood clot in your veins:    Leg swelling:         Pulmonary    Oxygen at home:    Productive cough:     Wheezing:         Neurologic    Sudden weakness in arms or legs:     Sudden numbness in arms or legs:  Sudden onset of difficulty  speaking or slurred speech:    Temporary loss of vision in one eye:     Problems with dizziness:         Gastrointestinal    Blood in stool:     Vomited blood:         Genitourinary    Burning when urinating:     Blood in urine:        Psychiatric    Major depression:         Hematologic    Bleeding problems:    Problems with blood clotting too easily:        Skin    Rashes or ulcers:        Constitutional    Fever or chills:     PHYSICAL EXAM:   Vitals:   01/20/18 1325  BP: 138/68  Pulse: 66  Resp: (!) 22  Temp: 100.2 F (37.9 C)  TempSrc: Oral  SpO2: 91%  Weight: 175 lb (79.4 kg)  Height: 6' 1" (1.854 m)    GENERAL: The patient is a well-nourished male, in no acute distress. The vital signs are documented above. CARDIAC: There is a regular rate and rhythm.  VASCULAR: I had to examine him in the wheelchair which made it difficult to assess his femoral pulses but I cannot feel a left femoral pulse.  I had a difficult time palpating the right femoral pulse. He has a monophasic peroneal, dorsalis pedis, and posterior tibial signal on the left with the Doppler. PULMONARY: There is good air exchange bilaterally without wheezing or rales. ABDOMEN: Soft and non-tender with normal pitched bowel sounds.  MUSCULOSKELETAL: He has a right below the knee amputation. NEUROLOGIC: No focal weakness or paresthesias are detected. SKIN: The wounds overlying the left great toe amputation site and the third toe wound have not improved compared to the pictures that are in the chart from 10/29/2017. PSYCHIATRIC: The patient has a normal affect.  DATA:    DUPLEX OF LEFT LOWER EXTREMITY BYPASS: I have individually interpreted the duplex scan today.  The study was somewhat limited as he was unable to get out of the wheelchair.  However the graft could be visualized and was patent throughout all the areas that were visualized.  There was monophasic flow noted throughout the graft.  I do  not have ABIs today however the patient did have an arterial Doppler study done in May of this year which showed an ABI of 92% on the left.  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  He has popped peroneal artery bypass graft is patent however given that the wound is not healing I have recommended that we proceed with an arteriogram to be sure that there are no other options for revascularization to improve the chances of healing a transmetatarsal amputation.  Currently I am concerned that he likely does not have adequate circulation to heal a transmetatarsal amputation.  I have scheduled the arteriogram for this Friday.  We have discussed the procedure and potential complications and he is agreeable to proceed.  He understands if we find disease amenable to angioplasty we would address this at the same time.  Christopher Dickson [facility] [place] [facility] [phone] 

## 2018-01-20 NOTE — Telephone Encounter (Signed)
FLO at [facility] given time change to 11 am on 01/22/18 and will give information to patient transportation.

## 2018-01-20 NOTE — Progress Notes (Signed)
Patient name: Terry Green MRN: [id number] DOB: [birth year] Sex: male  REASON FOR VISIT:   Follow-up of peripheral vascular disease.  HPI:   Terry Green is a pleasant 77 y.o. male who was last seen in our office by Dr. Fabienne Bruns on 10/29/2017.  He is undergone a previous left popliteal to peroneal artery bypass in June 2018.  The wounds at that time healed.  When he was seen on 10/29/2017 he had developed new wounds on his toes of the left foot.  He was started on Levaquin.  He was to return in 2 weeks and was to get a duplex and ABIs at that time.  He returns now for a follow-up visit.  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  He has been getting physical therapy trying to walk with the prosthesis.  Since he was seen last there is been no significant improvement on the wounds of his left foot.  He denies significant pain associated with this.  He did have a culture which grew staph aureus.  He is currently being treated with po Levaquin.  Past Medical History:  Diagnosis Date  . Abnormality of gait and mobility   . Anemia    iron deficiency  . Anxiety   . Arthritis    "hands" (06/06/2014)  . Bipolar disorder (HCC)   . BPH (benign prostatic hyperplasia)   . Coronary artery disease   . Dementia (HCC)   . Depression   . 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 Generalized weakness   . Hypercholesteremia   . Hypertension   . PAD (peripheral artery disease) (HCC)   . Peripheral vascular disease (HCC)   . Psychosis (HCC)   . Rheumatoid arthritis (HCC)   . Type II diabetes mellitus (HCC)    Type 2    Family History  Problem Relation Age of Onset  . Heart disease Father     SOCIAL HISTORY: Social History   Tobacco Use  . Smoking status: Former Smoker    Packs/day: 1.00    Years: 54.00    Pack years: 54.00    Types: Cigarettes    Last attempt to quit: 05/25/2012    Years since quitting: 5.6  . Smokeless tobacco: Never  Used  Substance Use Topics  . Alcohol use: Yes    Alcohol/week: 0.0 standard drinks    Comment: 06/06/2014 "I drank 1/2 pint whiskey each day;  nothing in 20 yrs"    Allergies  Allergen Reactions  . Lisinopril Swelling    SWELLING REACTION UNSPECIFIED     Current Outpatient Medications  Medication Sig Dispense Refill  . acetaminophen (TYLENOL) 500 MG tablet Take 1,000 mg by mouth every 8 (eight) hours as needed for moderate pain.    Marland Kitchen alum & mag hydroxide-simeth (MYLANTA) 200-200-20 MG/5ML suspension Take 15 mLs by mouth every 6 (six) hours as needed for indigestion or heartburn.    Marland Kitchen amLODipine (NORVASC) 5 MG tablet Take 5 mg by mouth daily.    Marland Kitchen aspirin EC 81 MG tablet Take 81 mg by mouth daily.    . Benzocaine-Menthol (CHLORASEPTIC SORE THROAT MT) Use as directed 1 spray in the mouth or throat every 2 (two) hours as needed (sore throat).     . busPIRone (BUSPAR) 15 MG tablet Take 15 mg by mouth every evening.    . carvedilol (COREG) 12.5 MG tablet Take 12.5 mg by mouth 2 (two) times daily with a  meal.    . clopidogrel (PLAVIX) 75 MG tablet Take 75 mg by mouth daily.    . divalproex (DEPAKOTE ER) 500 MG 24 hr tablet Take 500 mg by mouth 2 (two) times daily.     Marland Kitchen gabapentin (NEURONTIN) 300 MG capsule Take 300 mg by mouth 3 (three) times daily.    Marland Kitchen guaiFENesin-dextromethorphan (ROBITUSSIN DM) 100-10 MG/5ML syrup Take 10 mLs by mouth every 4 (four) hours as needed for cough.    Marland Kitchen HYDROcodone-acetaminophen (NORCO) 7.5-325 MG tablet Take 1-2 tablets by mouth every 4 (four) hours as needed for moderate pain. 30 tablet 0  . insulin aspart (NOVOLOG) 100 UNIT/ML FlexPen Inject 0-20 Units into the skin 2 (two) times daily. 6:30am and 4pm per sliding scale:  CBG 150-249 3 units, 250-349 5 units, 350-449 8 units, 450-549 14 units, 550-551 20 units >551 call MD    . Insulin Glargine (LANTUS) 100 UNIT/ML Solostar Pen Inject 15 Units into the skin at bedtime.     . Insulin Isophane & Regular Human  (HUMULIN 70/30 KWIKPEN) (70-30) 100 UNIT/ML PEN Inject into the skin.    Marland Kitchen levofloxacin (LEVAQUIN) 500 MG tablet Take 1 tablet (500 mg total) by mouth daily. 14 tablet 0  . loratadine (CLARITIN) 10 MG tablet Take 10 mg by mouth daily.    . memantine (NAMENDA) 10 MG tablet Take 10 mg by mouth 2 (two) times daily.    . metFORMIN (GLUCOPHAGE) 1000 MG tablet Take 1,000 mg by mouth 2 (two) times daily with a meal.     . Multiple Vitamins-Minerals (MULTIVITAMIN WITH MINERALS) tablet Take 1 tablet by mouth daily.    Marland Kitchen oxybutynin (DITROPAN-XL) 10 MG 24 hr tablet Take 10 mg by mouth at bedtime.    . risperiDONE (RISPERDAL) 0.5 MG tablet Take 0.5 mg by mouth at bedtime.    . sertraline (ZOLOFT) 100 MG tablet Take 100 mg by mouth daily.    . Sodium Chloride Flush (NORMAL SALINE FLUSH IV) Inject into the vein.    . tamsulosin (FLOMAX) 0.4 MG CAPS capsule Take 0.4 mg by mouth at bedtime.     . vancomycin 1,000 mg in sodium chloride 0.9 % 250 mL Inject 1,000 mg into the vein every 12 (twelve) hours. For infection to left foot. Start date: 08/10/16    . vitamin C (ASCORBIC ACID) 500 MG tablet Take 500 mg by mouth 2 (two) times daily.    Marland Kitchen atorvastatin (LIPITOR) 10 MG tablet Take 1 tablet (10 mg total) by mouth daily. 30 tablet 11   No current facility-administered medications for this visit.     REVIEW OF SYSTEMS:  [X]  denotes positive finding, [ ]  denotes negative finding Cardiac  Comments:  Chest pain or chest pressure:    Shortness of breath upon exertion:    Short of breath when lying flat:    Irregular heart rhythm:        Vascular    Pain in calf, thigh, or hip brought on by ambulation:    Pain in feet at night that wakes you up from your sleep:     Blood clot in your veins:    Leg swelling:         Pulmonary    Oxygen at home:    Productive cough:     Wheezing:         Neurologic    Sudden weakness in arms or legs:     Sudden numbness in arms or legs:  Sudden onset of difficulty  speaking or slurred speech:    Temporary loss of vision in one eye:     Problems with dizziness:         Gastrointestinal    Blood in stool:     Vomited blood:         Genitourinary    Burning when urinating:     Blood in urine:        Psychiatric    Major depression:         Hematologic    Bleeding problems:    Problems with blood clotting too easily:        Skin    Rashes or ulcers:        Constitutional    Fever or chills:     PHYSICAL EXAM:   Vitals:   01/20/18 1325  BP: 138/68  Pulse: 66  Resp: (!) 22  Temp: 100.2 F (37.9 C)  TempSrc: Oral  SpO2: 91%  Weight: 175 lb (79.4 kg)  Height: 6\' 1"  (1.854 m)    GENERAL: The patient is a well-nourished male, in no acute distress. The vital signs are documented above. CARDIAC: There is a regular rate and rhythm.  VASCULAR: I had to examine him in the wheelchair which made it difficult to assess his femoral pulses but I cannot feel a left femoral pulse.  I had a difficult time palpating the right femoral pulse. He has a monophasic peroneal, dorsalis pedis, and posterior tibial signal on the left with the Doppler. PULMONARY: There is good air exchange bilaterally without wheezing or rales. ABDOMEN: Soft and non-tender with normal pitched bowel sounds.  MUSCULOSKELETAL: He has a right below the knee amputation. NEUROLOGIC: No focal weakness or paresthesias are detected. SKIN: The wounds overlying the left great toe amputation site and the third toe wound have not improved compared to the pictures that are in the chart from 10/29/2017. PSYCHIATRIC: The patient has a normal affect.  DATA:    DUPLEX OF LEFT LOWER EXTREMITY BYPASS: I have individually interpreted the duplex scan today.  The study was somewhat limited as he was unable to get out of the wheelchair.  However the graft could be visualized and was patent throughout all the areas that were visualized.  There was monophasic flow noted throughout the graft.  I do  not have ABIs today however the patient did have an arterial Doppler study done in May of this year which showed an ABI of 92% on the left.  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  He has popped peroneal artery bypass graft is patent however given that the wound is not healing I have recommended that we proceed with an arteriogram to be sure that there are no other options for revascularization to improve the chances of healing a transmetatarsal amputation.  Currently I am concerned that he likely does not have adequate circulation to heal a transmetatarsal amputation.  I have scheduled the arteriogram for this Friday.  We have discussed the procedure and potential complications and he is agreeable to proceed.  He understands if we find disease amenable to angioplasty we would address this at the same time.  [name] [facility] [phone]

## 2018-01-20 NOTE — Progress Notes (Signed)
Call to 200 hall nurse at [facility] and instructed to have patient arrive at [facility] admitting at 11 am on 01/22/18 for procedure. Per Clydie Braun in [facility]

## 2018-01-21 ENCOUNTER — Encounter: Payer: Self-pay | Admitting: Geriatric Medicine

## 2018-01-22 ENCOUNTER — Ambulatory Visit (HOSPITAL_COMMUNITY)
Admission: RE | Admit: 2018-01-22 | Discharge: 2018-01-22 | Disposition: A | Discharge status: Home / Self Care | Payer: Medicare Other | Source: Ambulatory Visit | Attending: Vascular Surgery | Admitting: Vascular Surgery

## 2018-01-22 ENCOUNTER — Encounter (HOSPITAL_COMMUNITY)
Admission: RE | Disposition: A | Discharge status: Home / Self Care | Payer: Self-pay | Source: Ambulatory Visit | Attending: Vascular Surgery

## 2018-01-22 ENCOUNTER — Other Ambulatory Visit: Payer: Self-pay

## 2018-01-22 DIAGNOSIS — I70245 Atherosclerosis of native arteries of left leg with ulceration of other part of foot: Secondary | ICD-10-CM | POA: Insufficient documentation

## 2018-01-22 DIAGNOSIS — L97529 Non-pressure chronic ulcer of other part of left foot with unspecified severity: Secondary | ICD-10-CM | POA: Diagnosis not present

## 2018-01-22 DIAGNOSIS — F0390 Unspecified dementia without behavioral disturbance: Secondary | ICD-10-CM | POA: Insufficient documentation

## 2018-01-22 DIAGNOSIS — Z89511 Acquired absence of right leg below knee: Secondary | ICD-10-CM | POA: Insufficient documentation

## 2018-01-22 DIAGNOSIS — E11621 Type 2 diabetes mellitus with foot ulcer: Secondary | ICD-10-CM | POA: Diagnosis not present

## 2018-01-22 DIAGNOSIS — E7800 Pure hypercholesterolemia, unspecified: Secondary | ICD-10-CM | POA: Insufficient documentation

## 2018-01-22 DIAGNOSIS — N400 Enlarged prostate without lower urinary tract symptoms: Secondary | ICD-10-CM | POA: Diagnosis not present

## 2018-01-22 DIAGNOSIS — I739 Peripheral vascular disease, unspecified: Secondary | ICD-10-CM | POA: Diagnosis not present

## 2018-01-22 DIAGNOSIS — E1151 Type 2 diabetes mellitus with diabetic peripheral angiopathy without gangrene: Secondary | ICD-10-CM | POA: Diagnosis not present

## 2018-01-22 DIAGNOSIS — Z89412 Acquired absence of left great toe: Secondary | ICD-10-CM | POA: Insufficient documentation

## 2018-01-22 DIAGNOSIS — F419 Anxiety disorder, unspecified: Secondary | ICD-10-CM | POA: Diagnosis not present

## 2018-01-22 DIAGNOSIS — M069 Rheumatoid arthritis, unspecified: Secondary | ICD-10-CM | POA: Diagnosis not present

## 2018-01-22 DIAGNOSIS — Z7982 Long term (current) use of aspirin: Secondary | ICD-10-CM | POA: Insufficient documentation

## 2018-01-22 DIAGNOSIS — I10 Essential (primary) hypertension: Secondary | ICD-10-CM | POA: Insufficient documentation

## 2018-01-22 DIAGNOSIS — F319 Bipolar disorder, unspecified: Secondary | ICD-10-CM | POA: Insufficient documentation

## 2018-01-22 DIAGNOSIS — Z794 Long term (current) use of insulin: Secondary | ICD-10-CM | POA: Diagnosis not present

## 2018-01-22 DIAGNOSIS — R269 Unspecified abnormalities of gait and mobility: Secondary | ICD-10-CM | POA: Diagnosis not present

## 2018-01-22 DIAGNOSIS — Z7902 Long term (current) use of antithrombotics/antiplatelets: Secondary | ICD-10-CM | POA: Insufficient documentation

## 2018-01-22 DIAGNOSIS — Z87891 Personal history of nicotine dependence: Secondary | ICD-10-CM | POA: Diagnosis not present

## 2018-01-22 DIAGNOSIS — I2510 Atherosclerotic heart disease of native coronary artery without angina pectoris: Secondary | ICD-10-CM | POA: Insufficient documentation

## 2018-01-22 HISTORY — PX: LOWER EXTREMITY ANGIOGRAPHY: CATH118251

## 2018-01-22 LAB — POCT I-STAT, CHEM 8
BUN: 23 mg/dL (ref 8–23)
CHLORIDE: 105 mmol/L (ref 98–111)
CREATININE: 1.1 mg/dL (ref 0.61–1.24)
Calcium, Ion: 1.16 mmol/L (ref 1.15–1.40)
GLUCOSE: 149 mg/dL — AB (ref 70–99)
HCT: 33 % — ABNORMAL LOW (ref 39.0–52.0)
Hemoglobin: 11.2 g/dL — ABNORMAL LOW (ref 13.0–17.0)
POTASSIUM: 4.4 mmol/L (ref 3.5–5.1)
Sodium: 139 mmol/L (ref 135–145)
TCO2: 23 mmol/L (ref 22–32)

## 2018-01-22 SURGERY — LOWER EXTREMITY ANGIOGRAPHY
Anesthesia: LOCAL

## 2018-01-22 MED ORDER — SODIUM CHLORIDE 0.9% FLUSH: 3.0000 mL | Freq: Two times a day (BID) | INTRAVENOUS | Status: DC

## 2018-01-22 MED ORDER — ACETAMINOPHEN 325 MG PO TABS: 650.0000 mg | ORAL_TABLET | ORAL | Status: DC | PRN

## 2018-01-22 MED ORDER — HEPARIN (PORCINE) IN NACL 1000-0.9 UT/500ML-% IV SOLN
INTRAVENOUS | Status: DC | PRN
  Administered 2018-01-22: 500 mL

## 2018-01-22 MED ORDER — FENTANYL CITRATE (PF) 100 MCG/2ML IJ SOLN
INTRAMUSCULAR | Status: DC | PRN
  Administered 2018-01-22: 25 ug via INTRAVENOUS

## 2018-01-22 MED ORDER — HYDRALAZINE HCL 20 MG/ML IJ SOLN: 5.0000 mg | INTRAMUSCULAR | Status: DC | PRN

## 2018-01-22 MED ORDER — MIDAZOLAM HCL 2 MG/2ML IJ SOLN
INTRAMUSCULAR | Status: DC | PRN
  Administered 2018-01-22: 0.5 mg via INTRAVENOUS

## 2018-01-22 MED ORDER — FENTANYL CITRATE (PF) 100 MCG/2ML IJ SOLN
INTRAMUSCULAR | Status: AC
  Filled 2018-01-22: qty 2

## 2018-01-22 MED ORDER — HEPARIN (PORCINE) IN NACL 1000-0.9 UT/500ML-% IV SOLN
INTRAVENOUS | Status: AC
  Filled 2018-01-22: qty 1000

## 2018-01-22 MED ORDER — LIDOCAINE HCL (PF) 1 % IJ SOLN
INTRAMUSCULAR | Status: AC
  Filled 2018-01-22: qty 30

## 2018-01-22 MED ORDER — IODIXANOL 320 MG/ML IV SOLN
INTRAVENOUS | Status: DC | PRN
  Administered 2018-01-22: 75 mL via INTRA_ARTERIAL

## 2018-01-22 MED ORDER — SODIUM CHLORIDE 0.9% FLUSH: 3.0000 mL | INTRAVENOUS | Status: DC | PRN

## 2018-01-22 MED ORDER — SODIUM CHLORIDE 0.9 % WEIGHT BASED INFUSION: 1.0000 mL/kg/h | INTRAVENOUS | Status: DC

## 2018-01-22 MED ORDER — MIDAZOLAM HCL 2 MG/2ML IJ SOLN
INTRAMUSCULAR | Status: AC
  Filled 2018-01-22: qty 2

## 2018-01-22 MED ORDER — LABETALOL HCL 5 MG/ML IV SOLN: 10.0000 mg | INTRAVENOUS | Status: DC | PRN

## 2018-01-22 MED ORDER — ONDANSETRON HCL 4 MG/2ML IJ SOLN: 4.0000 mg | Freq: Four times a day (QID) | INTRAMUSCULAR | Status: DC | PRN

## 2018-01-22 MED ORDER — SODIUM CHLORIDE 0.9 % IV SOLN
INTRAVENOUS | Status: DC
  Administered 2018-01-22: 11:00:00 via INTRAVENOUS

## 2018-01-22 MED ORDER — LIDOCAINE HCL (PF) 1 % IJ SOLN
INTRAMUSCULAR | Status: DC | PRN
  Administered 2018-01-22: 15 mL

## 2018-01-22 MED ORDER — SODIUM CHLORIDE 0.9 % IV SOLN: 250.0000 mL | INTRAVENOUS | Status: DC | PRN

## 2018-01-22 SURGICAL SUPPLY — 13 items
CATH ANGIO 5F PIGTAIL 65CM (CATHETERS) ×2 IMPLANT
CATH CROSS OVER TEMPO 5F (CATHETERS) ×2 IMPLANT
CATH STRAIGHT 5FR 65CM (CATHETERS) ×2 IMPLANT
KIT MICROPUNCTURE NIT STIFF (SHEATH) ×2 IMPLANT
KIT PV (KITS) ×2 IMPLANT
SHEATH PINNACLE 5F 10CM (SHEATH) ×2 IMPLANT
SHEATH PROBE COVER 6X72 (BAG) ×2 IMPLANT
STOPCOCK MORSE 400PSI 3WAY (MISCELLANEOUS) ×2 IMPLANT
SYR MEDRAD MARK 7 150ML (SYRINGE) ×2 IMPLANT
TRANSDUCER W/STOPCOCK (MISCELLANEOUS) ×2 IMPLANT
TRAY PV CATH (CUSTOM PROCEDURE TRAY) ×2 IMPLANT
TUBING CIL FLEX 10 FLL-RA (TUBING) ×2 IMPLANT
WIRE BENTSON .035X145CM (WIRE) ×2 IMPLANT

## 2018-01-22 NOTE — Discharge Instructions (Signed)

## 2018-01-22 NOTE — Progress Notes (Signed)
Site area: Right groin a 5 french arterial sheath was removed  Site Prior to Removal:  Level 0  Pressure Applied For 20 MINUTES    Bedrest Beginning at 1500p  Manual:   Yes.    Patient Status During Pull:  stable  Post Pull Groin Site:  Level 0  Post Pull Instructions Given:  Yes.    Post Pull Pulses Present:  Yes.    Dressing Applied:  Yes.    Comments:  VS remain stable

## 2018-01-22 NOTE — Interval H&P Note (Signed)
History and Physical Interval Note:  01/22/2018 1:29 PM  Terry Green  has presented today for surgery, with the diagnosis of pad  The various methods of treatment have been discussed with the patient and family. After consideration of risks, benefits and other options for treatment, the patient has consented to  Procedure(s): LOWER EXTREMITY ANGIOGRAPHY (N/A) as a surgical intervention .  The patient's history has been reviewed, patient examined, no change in status, stable for surgery.  I have reviewed the patient's chart and labs.  Questions were answered to the patient's satisfaction.     [name]

## 2018-01-22 NOTE — Op Note (Signed)
   PATIENT: Terry Green      MRN: [id number] DOB: [birth year]    DATE OF PROCEDURE: 01/22/2018  INDICATIONS:    Terry Green is a 77 y.o. male who presented with nonhealing wounds on his left foot.  He had a previous left great toe amputation.  He has a left popliteal to peroneal artery bypass.  He presents for arteriography.  PROCEDURE:    1.  Conscious sedation 2.  Ultrasound-guided access to the right common femoral artery 3.  Aortogram with bilateral iliac arteriogram 4.  Selective catheterization of the left external iliac artery (second order catheterization) with left lower extremity runoff  SURGEON: Di Kindle. Edilia Bo, MD, FACS  ANESTHESIA: Local with sedation  EBL: Minimal  TECHNIQUE: The patient was brought to the peripheral vascular lab and was sedated. The period of conscious sedation was 35 minutes.  During that time period, I was present face-to-face 100% of the time.  The patient was administered half a milligram of Versed and 25 mcg of fentanyl. The patient's heart rate, blood pressure, and oxygen saturation were monitored by the nurse continuously during the procedure.  Both groins were prepped and draped in the usual sterile fashion.  Under ultrasound guidance, after the skin was anesthetized, I cannulated the right common femoral artery with a micropuncture needle and a micropuncture sheath was introduced over a wire.  This was exchanged for a 5 [place] sheath over a Bentson wire.  By ultrasound the femoral artery was patent. A real-time image was obtained and placed in the chart.   A pigtail catheter was positioned at the L1 vertebral body and flush aortogram obtained.  The catheter was then positioned above the aortic bifurcation and an oblique iliac projection was obtained.  I then exchanged this catheter for a crossover catheter which was positioned into the left common iliac artery.  The wire was advanced into the external iliac artery and the crossover catheter  exchanged for a straight catheter.  Selective left external iliac arterial grams obtained with left lower extremity runoff.  This catheter was then removed.  The patient was transferred to the holding area for removal of the sheath.  No immediate complications were noted.  FINDINGS:   1.  No significant aortoiliac occlusive disease 2.  On the left side the common femoral, deep femoral, superficial femoral and popliteal arteries are widely patent.  The popliteal artery is occluded distally.  The popliteal to peroneal artery bypass graft is widely patent without any stenoses noted.  The peroneal artery is patent to the ankle.  The anterior tibial and posterior tibial arteries are occluded.  CLINICAL NOTE: The patient has no options for revascularization.  He has single-vessel runoff via the peroneal artery.  I think he has a reasonable chance of healing a transmetatarsal amputation.  If the toe wounds do not heal that he will be scheduled for transmetatarsal amputation.  Also plan on seeing him back in 2 to 3 weeks.  [name], MD, FACS [facility]  DATE OF DICTATION:   01/22/2018

## 2018-01-25 ENCOUNTER — Encounter (HOSPITAL_COMMUNITY): Payer: Self-pay | Admitting: Vascular Surgery

## 2018-01-25 ENCOUNTER — Telehealth: Payer: Self-pay | Admitting: Vascular Surgery

## 2018-01-25 DIAGNOSIS — R262 Difficulty in walking, not elsewhere classified: Secondary | ICD-10-CM | POA: Diagnosis not present

## 2018-01-25 DIAGNOSIS — R41841 Cognitive communication deficit: Secondary | ICD-10-CM | POA: Diagnosis not present

## 2018-01-25 DIAGNOSIS — M9702XD Periprosthetic fracture around internal prosthetic left hip joint, subsequent encounter: Secondary | ICD-10-CM | POA: Diagnosis not present

## 2018-01-25 DIAGNOSIS — R1314 Dysphagia, pharyngoesophageal phase: Secondary | ICD-10-CM | POA: Diagnosis not present

## 2018-01-25 DIAGNOSIS — M6281 Muscle weakness (generalized): Secondary | ICD-10-CM | POA: Diagnosis not present

## 2018-01-25 NOTE — Telephone Encounter (Signed)
sch appt spk to pt transpo 02/24/18 3pm p/o MD

## 2018-01-25 NOTE — Telephone Encounter (Signed)
-----   Message from Sharee Pimple, RN sent at 01/22/2018  3:11 PM EST ----- Regarding: 3-4 weeks wound check   ----- Message ----- From: Chuck Hint, MD Sent: 01/22/2018   2:33 PM EST To: [facility] Charge Pool Subject: charge                                         PROCEDURE:   1.  Conscious sedation 2.  Ultrasound-guided access to the right common femoral artery 3.  Aortogram with bilateral iliac arteriogram 4.  Selective catheterization of the left external iliac artery (second order catheterization) with left lower extremity runoff  SURGEON: Di Kindle. Edilia Bo, MD, FACS I will need to see him in the office in  3-4  weeks to check on his wound.  If the wounds on the left foot are not improving he will need a transmetatarsal amputation on the left.  Thank you. CD

## 2018-01-26 DIAGNOSIS — J111 Influenza due to unidentified influenza virus with other respiratory manifestations: Secondary | ICD-10-CM | POA: Diagnosis not present

## 2018-01-27 DIAGNOSIS — R262 Difficulty in walking, not elsewhere classified: Secondary | ICD-10-CM | POA: Diagnosis not present

## 2018-01-27 DIAGNOSIS — R41841 Cognitive communication deficit: Secondary | ICD-10-CM | POA: Diagnosis not present

## 2018-01-27 DIAGNOSIS — R1314 Dysphagia, pharyngoesophageal phase: Secondary | ICD-10-CM | POA: Diagnosis not present

## 2018-01-27 DIAGNOSIS — M9702XD Periprosthetic fracture around internal prosthetic left hip joint, subsequent encounter: Secondary | ICD-10-CM | POA: Diagnosis not present

## 2018-01-27 DIAGNOSIS — M6281 Muscle weakness (generalized): Secondary | ICD-10-CM | POA: Diagnosis not present

## 2018-01-28 DIAGNOSIS — R41841 Cognitive communication deficit: Secondary | ICD-10-CM | POA: Diagnosis not present

## 2018-01-28 DIAGNOSIS — R262 Difficulty in walking, not elsewhere classified: Secondary | ICD-10-CM | POA: Diagnosis not present

## 2018-01-28 DIAGNOSIS — M9702XD Periprosthetic fracture around internal prosthetic left hip joint, subsequent encounter: Secondary | ICD-10-CM | POA: Diagnosis not present

## 2018-01-28 DIAGNOSIS — M6281 Muscle weakness (generalized): Secondary | ICD-10-CM | POA: Diagnosis not present

## 2018-01-28 DIAGNOSIS — R1314 Dysphagia, pharyngoesophageal phase: Secondary | ICD-10-CM | POA: Diagnosis not present

## 2018-01-29 DIAGNOSIS — R1314 Dysphagia, pharyngoesophageal phase: Secondary | ICD-10-CM | POA: Diagnosis not present

## 2018-01-29 DIAGNOSIS — M6281 Muscle weakness (generalized): Secondary | ICD-10-CM | POA: Diagnosis not present

## 2018-01-29 DIAGNOSIS — M9702XD Periprosthetic fracture around internal prosthetic left hip joint, subsequent encounter: Secondary | ICD-10-CM | POA: Diagnosis not present

## 2018-01-29 DIAGNOSIS — R41841 Cognitive communication deficit: Secondary | ICD-10-CM | POA: Diagnosis not present

## 2018-01-29 DIAGNOSIS — R262 Difficulty in walking, not elsewhere classified: Secondary | ICD-10-CM | POA: Diagnosis not present

## 2018-01-30 DIAGNOSIS — M6281 Muscle weakness (generalized): Secondary | ICD-10-CM | POA: Diagnosis not present

## 2018-01-30 DIAGNOSIS — R41841 Cognitive communication deficit: Secondary | ICD-10-CM | POA: Diagnosis not present

## 2018-01-30 DIAGNOSIS — R1314 Dysphagia, pharyngoesophageal phase: Secondary | ICD-10-CM | POA: Diagnosis not present

## 2018-01-30 DIAGNOSIS — R262 Difficulty in walking, not elsewhere classified: Secondary | ICD-10-CM | POA: Diagnosis not present

## 2018-01-30 DIAGNOSIS — M9702XD Periprosthetic fracture around internal prosthetic left hip joint, subsequent encounter: Secondary | ICD-10-CM | POA: Diagnosis not present

## 2018-02-01 DIAGNOSIS — R262 Difficulty in walking, not elsewhere classified: Secondary | ICD-10-CM | POA: Diagnosis not present

## 2018-02-01 DIAGNOSIS — R41841 Cognitive communication deficit: Secondary | ICD-10-CM | POA: Diagnosis not present

## 2018-02-01 DIAGNOSIS — E1142 Type 2 diabetes mellitus with diabetic polyneuropathy: Secondary | ICD-10-CM | POA: Diagnosis not present

## 2018-02-01 DIAGNOSIS — I10 Essential (primary) hypertension: Secondary | ICD-10-CM | POA: Diagnosis not present

## 2018-02-01 DIAGNOSIS — M9702XD Periprosthetic fracture around internal prosthetic left hip joint, subsequent encounter: Secondary | ICD-10-CM | POA: Diagnosis not present

## 2018-02-01 DIAGNOSIS — M6281 Muscle weakness (generalized): Secondary | ICD-10-CM | POA: Diagnosis not present

## 2018-02-01 DIAGNOSIS — M1990 Unspecified osteoarthritis, unspecified site: Secondary | ICD-10-CM | POA: Diagnosis not present

## 2018-02-01 DIAGNOSIS — M069 Rheumatoid arthritis, unspecified: Secondary | ICD-10-CM | POA: Diagnosis not present

## 2018-02-01 DIAGNOSIS — R1314 Dysphagia, pharyngoesophageal phase: Secondary | ICD-10-CM | POA: Diagnosis not present

## 2018-02-02 DIAGNOSIS — M6281 Muscle weakness (generalized): Secondary | ICD-10-CM | POA: Diagnosis not present

## 2018-02-02 DIAGNOSIS — R41841 Cognitive communication deficit: Secondary | ICD-10-CM | POA: Diagnosis not present

## 2018-02-02 DIAGNOSIS — R1314 Dysphagia, pharyngoesophageal phase: Secondary | ICD-10-CM | POA: Diagnosis not present

## 2018-02-02 DIAGNOSIS — R262 Difficulty in walking, not elsewhere classified: Secondary | ICD-10-CM | POA: Diagnosis not present

## 2018-02-02 DIAGNOSIS — M9702XD Periprosthetic fracture around internal prosthetic left hip joint, subsequent encounter: Secondary | ICD-10-CM | POA: Diagnosis not present

## 2018-02-03 DIAGNOSIS — M6281 Muscle weakness (generalized): Secondary | ICD-10-CM | POA: Diagnosis not present

## 2018-02-03 DIAGNOSIS — M9702XD Periprosthetic fracture around internal prosthetic left hip joint, subsequent encounter: Secondary | ICD-10-CM | POA: Diagnosis not present

## 2018-02-03 DIAGNOSIS — R41841 Cognitive communication deficit: Secondary | ICD-10-CM | POA: Diagnosis not present

## 2018-02-03 DIAGNOSIS — R262 Difficulty in walking, not elsewhere classified: Secondary | ICD-10-CM | POA: Diagnosis not present

## 2018-02-03 DIAGNOSIS — R1314 Dysphagia, pharyngoesophageal phase: Secondary | ICD-10-CM | POA: Diagnosis not present

## 2018-02-04 DIAGNOSIS — R262 Difficulty in walking, not elsewhere classified: Secondary | ICD-10-CM | POA: Diagnosis not present

## 2018-02-04 DIAGNOSIS — R41841 Cognitive communication deficit: Secondary | ICD-10-CM | POA: Diagnosis not present

## 2018-02-04 DIAGNOSIS — M6281 Muscle weakness (generalized): Secondary | ICD-10-CM | POA: Diagnosis not present

## 2018-02-04 DIAGNOSIS — R1314 Dysphagia, pharyngoesophageal phase: Secondary | ICD-10-CM | POA: Diagnosis not present

## 2018-02-04 DIAGNOSIS — M9702XD Periprosthetic fracture around internal prosthetic left hip joint, subsequent encounter: Secondary | ICD-10-CM | POA: Diagnosis not present

## 2018-02-05 DIAGNOSIS — M6281 Muscle weakness (generalized): Secondary | ICD-10-CM | POA: Diagnosis not present

## 2018-02-05 DIAGNOSIS — R1314 Dysphagia, pharyngoesophageal phase: Secondary | ICD-10-CM | POA: Diagnosis not present

## 2018-02-05 DIAGNOSIS — R262 Difficulty in walking, not elsewhere classified: Secondary | ICD-10-CM | POA: Diagnosis not present

## 2018-02-05 DIAGNOSIS — M9702XD Periprosthetic fracture around internal prosthetic left hip joint, subsequent encounter: Secondary | ICD-10-CM | POA: Diagnosis not present

## 2018-02-05 DIAGNOSIS — R41841 Cognitive communication deficit: Secondary | ICD-10-CM | POA: Diagnosis not present

## 2018-02-07 DIAGNOSIS — R1314 Dysphagia, pharyngoesophageal phase: Secondary | ICD-10-CM | POA: Diagnosis not present

## 2018-02-07 DIAGNOSIS — R41841 Cognitive communication deficit: Secondary | ICD-10-CM | POA: Diagnosis not present

## 2018-02-07 DIAGNOSIS — R262 Difficulty in walking, not elsewhere classified: Secondary | ICD-10-CM | POA: Diagnosis not present

## 2018-02-07 DIAGNOSIS — M9702XD Periprosthetic fracture around internal prosthetic left hip joint, subsequent encounter: Secondary | ICD-10-CM | POA: Diagnosis not present

## 2018-02-07 DIAGNOSIS — M6281 Muscle weakness (generalized): Secondary | ICD-10-CM | POA: Diagnosis not present

## 2018-02-08 DIAGNOSIS — R41841 Cognitive communication deficit: Secondary | ICD-10-CM | POA: Diagnosis not present

## 2018-02-08 DIAGNOSIS — R262 Difficulty in walking, not elsewhere classified: Secondary | ICD-10-CM | POA: Diagnosis not present

## 2018-02-08 DIAGNOSIS — R1314 Dysphagia, pharyngoesophageal phase: Secondary | ICD-10-CM | POA: Diagnosis not present

## 2018-02-08 DIAGNOSIS — M9702XD Periprosthetic fracture around internal prosthetic left hip joint, subsequent encounter: Secondary | ICD-10-CM | POA: Diagnosis not present

## 2018-02-08 DIAGNOSIS — M6281 Muscle weakness (generalized): Secondary | ICD-10-CM | POA: Diagnosis not present

## 2018-02-09 DIAGNOSIS — R41841 Cognitive communication deficit: Secondary | ICD-10-CM | POA: Diagnosis not present

## 2018-02-09 DIAGNOSIS — R1314 Dysphagia, pharyngoesophageal phase: Secondary | ICD-10-CM | POA: Diagnosis not present

## 2018-02-09 DIAGNOSIS — M6281 Muscle weakness (generalized): Secondary | ICD-10-CM | POA: Diagnosis not present

## 2018-02-09 DIAGNOSIS — M9702XD Periprosthetic fracture around internal prosthetic left hip joint, subsequent encounter: Secondary | ICD-10-CM | POA: Diagnosis not present

## 2018-02-09 DIAGNOSIS — R262 Difficulty in walking, not elsewhere classified: Secondary | ICD-10-CM | POA: Diagnosis not present

## 2018-02-10 DIAGNOSIS — R262 Difficulty in walking, not elsewhere classified: Secondary | ICD-10-CM | POA: Diagnosis not present

## 2018-02-10 DIAGNOSIS — M6281 Muscle weakness (generalized): Secondary | ICD-10-CM | POA: Diagnosis not present

## 2018-02-10 DIAGNOSIS — M9702XD Periprosthetic fracture around internal prosthetic left hip joint, subsequent encounter: Secondary | ICD-10-CM | POA: Diagnosis not present

## 2018-02-10 DIAGNOSIS — R41841 Cognitive communication deficit: Secondary | ICD-10-CM | POA: Diagnosis not present

## 2018-02-10 DIAGNOSIS — R1314 Dysphagia, pharyngoesophageal phase: Secondary | ICD-10-CM | POA: Diagnosis not present

## 2018-02-11 DIAGNOSIS — R262 Difficulty in walking, not elsewhere classified: Secondary | ICD-10-CM | POA: Diagnosis not present

## 2018-02-11 DIAGNOSIS — R41841 Cognitive communication deficit: Secondary | ICD-10-CM | POA: Diagnosis not present

## 2018-02-11 DIAGNOSIS — M6281 Muscle weakness (generalized): Secondary | ICD-10-CM | POA: Diagnosis not present

## 2018-02-11 DIAGNOSIS — R1314 Dysphagia, pharyngoesophageal phase: Secondary | ICD-10-CM | POA: Diagnosis not present

## 2018-02-11 DIAGNOSIS — M9702XD Periprosthetic fracture around internal prosthetic left hip joint, subsequent encounter: Secondary | ICD-10-CM | POA: Diagnosis not present

## 2018-02-13 DIAGNOSIS — M6281 Muscle weakness (generalized): Secondary | ICD-10-CM | POA: Diagnosis not present

## 2018-02-13 DIAGNOSIS — R41841 Cognitive communication deficit: Secondary | ICD-10-CM | POA: Diagnosis not present

## 2018-02-13 DIAGNOSIS — R262 Difficulty in walking, not elsewhere classified: Secondary | ICD-10-CM | POA: Diagnosis not present

## 2018-02-13 DIAGNOSIS — R1314 Dysphagia, pharyngoesophageal phase: Secondary | ICD-10-CM | POA: Diagnosis not present

## 2018-02-13 DIAGNOSIS — M9702XD Periprosthetic fracture around internal prosthetic left hip joint, subsequent encounter: Secondary | ICD-10-CM | POA: Diagnosis not present

## 2018-02-15 DIAGNOSIS — R262 Difficulty in walking, not elsewhere classified: Secondary | ICD-10-CM | POA: Diagnosis not present

## 2018-02-15 DIAGNOSIS — R1314 Dysphagia, pharyngoesophageal phase: Secondary | ICD-10-CM | POA: Diagnosis not present

## 2018-02-15 DIAGNOSIS — R41841 Cognitive communication deficit: Secondary | ICD-10-CM | POA: Diagnosis not present

## 2018-02-15 DIAGNOSIS — M9702XD Periprosthetic fracture around internal prosthetic left hip joint, subsequent encounter: Secondary | ICD-10-CM | POA: Diagnosis not present

## 2018-02-15 DIAGNOSIS — M6281 Muscle weakness (generalized): Secondary | ICD-10-CM | POA: Diagnosis not present

## 2018-02-15 DIAGNOSIS — S72115A Nondisplaced fracture of greater trochanter of left femur, initial encounter for closed fracture: Secondary | ICD-10-CM | POA: Diagnosis not present

## 2018-02-16 DIAGNOSIS — M9702XD Periprosthetic fracture around internal prosthetic left hip joint, subsequent encounter: Secondary | ICD-10-CM | POA: Diagnosis not present

## 2018-02-16 DIAGNOSIS — R1314 Dysphagia, pharyngoesophageal phase: Secondary | ICD-10-CM | POA: Diagnosis not present

## 2018-02-16 DIAGNOSIS — R41841 Cognitive communication deficit: Secondary | ICD-10-CM | POA: Diagnosis not present

## 2018-02-16 DIAGNOSIS — M6281 Muscle weakness (generalized): Secondary | ICD-10-CM | POA: Diagnosis not present

## 2018-02-16 DIAGNOSIS — R262 Difficulty in walking, not elsewhere classified: Secondary | ICD-10-CM | POA: Diagnosis not present

## 2018-02-17 DIAGNOSIS — R41841 Cognitive communication deficit: Secondary | ICD-10-CM | POA: Diagnosis not present

## 2018-02-17 DIAGNOSIS — R262 Difficulty in walking, not elsewhere classified: Secondary | ICD-10-CM | POA: Diagnosis not present

## 2018-02-17 DIAGNOSIS — M9702XD Periprosthetic fracture around internal prosthetic left hip joint, subsequent encounter: Secondary | ICD-10-CM | POA: Diagnosis not present

## 2018-02-17 DIAGNOSIS — R1314 Dysphagia, pharyngoesophageal phase: Secondary | ICD-10-CM | POA: Diagnosis not present

## 2018-02-17 DIAGNOSIS — M6281 Muscle weakness (generalized): Secondary | ICD-10-CM | POA: Diagnosis not present

## 2018-02-18 DIAGNOSIS — R262 Difficulty in walking, not elsewhere classified: Secondary | ICD-10-CM | POA: Diagnosis not present

## 2018-02-18 DIAGNOSIS — M6281 Muscle weakness (generalized): Secondary | ICD-10-CM | POA: Diagnosis not present

## 2018-02-18 DIAGNOSIS — R41841 Cognitive communication deficit: Secondary | ICD-10-CM | POA: Diagnosis not present

## 2018-02-18 DIAGNOSIS — R1314 Dysphagia, pharyngoesophageal phase: Secondary | ICD-10-CM | POA: Diagnosis not present

## 2018-02-18 DIAGNOSIS — M9702XD Periprosthetic fracture around internal prosthetic left hip joint, subsequent encounter: Secondary | ICD-10-CM | POA: Diagnosis not present

## 2018-02-23 DIAGNOSIS — J40 Bronchitis, not specified as acute or chronic: Secondary | ICD-10-CM | POA: Diagnosis not present

## 2018-02-24 ENCOUNTER — Other Ambulatory Visit: Payer: Self-pay

## 2018-02-24 ENCOUNTER — Encounter: Payer: Self-pay | Admitting: Vascular Surgery

## 2018-02-24 ENCOUNTER — Ambulatory Visit (INDEPENDENT_AMBULATORY_CARE_PROVIDER_SITE_OTHER): Payer: Medicare Other | Admitting: Vascular Surgery

## 2018-02-24 VITALS — BP 113/64 | HR 50 | Temp 97.3°F | Resp 20 | Ht 73.0 in | Wt 191.0 lb

## 2018-02-24 DIAGNOSIS — I70262 Atherosclerosis of native arteries of extremities with gangrene, left leg: Secondary | ICD-10-CM

## 2018-02-24 NOTE — Progress Notes (Signed)
Patient name: Terry Green MRN: [id number] DOB: [birth year] Sex: male  REASON FOR VISIT:   Follow-up of left great toe amputation site and left third toe wound.  HPI:   Terry Green is a pleasant 77 y.o. male who it undergone left great toe amputation.  He has a left popliteal to peroneal artery bypass.  He had a nonhealing wound on the left foot.  He underwent arteriography on 01/22/2018.  His arteriogram showed no evidence of significant aortoiliac occlusive disease.  On the left side the common femoral, superficial femoral, popliteal arteries were all widely patent.  The popliteal artery was occluded distally and the popliteal to peroneal artery bypass graft was widely patent without any areas of stenosis within the graft.  The peroneal artery was patent to the ankle.  The anterior tibial and posterior tibial arteries were occluded.  Thus based on these findings the patient had no options for revascularization.  I felt that he had a reasonable chance of healing a transmetatarsal amputation.  Therefore I plan on seeing him back in the office to see if the wounds on the foot were healing and if not we would consider a transmetatarsal amputation.  Since I saw him last, he has been doing well although he just recently developed a cold.  He has not had any significant fever or chills.  He denies significant drainage from the wounds on his left foot.  Current Outpatient Medications  Medication Sig Dispense Refill  . amLODipine (NORVASC) 10 MG tablet Take 10 mg by mouth daily. (0900)    . aspirin EC 81 MG tablet Take 81 mg by mouth daily. (0900)    . atorvastatin (LIPITOR) 40 MG tablet Take 40 mg by mouth daily at 8 pm.    . busPIRone (BUSPAR) 10 MG tablet Take 10 mg by mouth daily. (0900)    . carvedilol (COREG) 25 MG tablet Take 25 mg by mouth 2 (two) times daily. (0900 & 2000)    . divalproex (DEPAKOTE) 250 MG DR tablet Take 500 mg by mouth 2 (two) times daily. (0900 & 2000)    . ferrous  sulfate 325 (65 FE) MG tablet Take 325 mg by mouth daily with breakfast. (0900)    . fluticasone (FLONASE) 50 MCG/ACT nasal spray Place 2 sprays into both nostrils daily. (0800)    . gabapentin (NEURONTIN) 300 MG capsule Take 300 mg by mouth 3 (three) times daily. (0900, 1600, & 2000)    . guaiFENesin (ROBITUSSIN MUCUS+CHEST CONGEST) 100 MG/5ML liquid Take 200 mg by mouth every 4 (four) hours as needed for cough.    . hydrALAZINE (APRESOLINE) 50 MG tablet Take 50 mg by mouth 3 (three) times daily. (0800, 1600, & 2000)    . HYDROcodone-acetaminophen (NORCO/VICODIN) 5-325 MG tablet Take 1 tablet by mouth every 6 (six) hours as needed (for pain.).    . Insulin Glargine (BASAGLAR KWIKPEN) 100 UNIT/ML SOPN Inject 38 Units into the skin at bedtime. (2000)    . loratadine (CLARITIN) 10 MG tablet Take 10 mg by mouth daily. (0900)    . memantine (NAMENDA) 10 MG tablet Take 10 mg by mouth 2 (two) times daily. (0900 & 2000)    . Multiple Vitamins-Minerals (MULTIVITAMIN WITH MINERALS) tablet Take 1 tablet by mouth daily. (0900)    . NOVOLIN N 100 UNIT/ML injection Inject 55 Units into the skin 2 (two) times daily.    Marland Kitchen oxybutynin (DITROPAN) 5 MG tablet Take 10 mg by mouth daily. (0900)    .  polyethylene glycol powder (GLYCOLAX/MIRALAX) powder Take 17 g by mouth daily as needed (constipation).    . risperiDONE (RISPERDAL) 0.25 MG tablet Take 0.25 mg by mouth daily at 8 pm. (2000)    . sertraline (ZOLOFT) 100 MG tablet Take 100 mg by mouth daily. (0900)    . tamsulosin (FLOMAX) 0.4 MG CAPS capsule Take 0.4 mg by mouth daily at 8 pm. (2000)    . levofloxacin (LEVAQUIN) 500 MG tablet Take 1 tablet (500 mg total) by mouth daily. (Patient not taking: Reported on 02/24/2018) 14 tablet 0   No current facility-administered medications for this visit.     REVIEW OF SYSTEMS:  [X]  denotes positive finding, [ ]  denotes negative finding Vascular    Leg swelling    Cardiac    Chest pain or chest pressure:      Shortness of breath upon exertion:    Short of breath when lying flat:    Irregular heart rhythm:    Constitutional    Fever or chills:     PHYSICAL EXAM:   Vitals:   02/24/18 1414  BP: 113/64  Pulse: (!) 50  Resp: 20  Temp: (!) 97.3 F (36.3 C)  TempSrc: Oral  SpO2: 96%  Weight: 191 lb (86.6 kg)  Height: 6\' 1"  (1.854 m)    GENERAL: The patient is a well-nourished male, in no acute distress. The vital signs are documented above. CARDIOVASCULAR: There is a regular rate and rhythm. PULMONARY: There is good air exchange bilaterally without wheezing or rales. He has a brisk peroneal signal with the Doppler. The wound on the left great toe amputation site measures 3.2 cm in length by 2.8 cm in width. The wound on the left third toe measures 1.5 cm in width and 1.5 cm in length.   DATA:   No new data  MEDICAL ISSUES:   PERIPHERAL VASCULAR DISEASE WITH LEFT FOOT WOUNDS: The wound on the left great toe amputation site and the left third toe are gradually improving.  He is undergone an arteriogram which shows no further options for revascularization.  He has no significant disease down to the popliteal artery which is then occluded below the knee.  His popliteal to peroneal artery bypass graft is patent with single-vessel runoff via the peroneal artery which stops above the ankle.  If these wounds failed to progress the only remaining option would be attempted transmetatarsal amputation.  I will see him back in approximately 6 weeks.  He knows to call sooner if he has problems.  We have discussed the importance of leg elevation as he does have some mild swelling in the left foot.  [facility] [phone]

## 2018-02-27 DIAGNOSIS — Z5181 Encounter for therapeutic drug level monitoring: Secondary | ICD-10-CM | POA: Diagnosis not present

## 2018-02-27 DIAGNOSIS — E119 Type 2 diabetes mellitus without complications: Secondary | ICD-10-CM | POA: Diagnosis not present

## 2018-02-27 DIAGNOSIS — R569 Unspecified convulsions: Secondary | ICD-10-CM | POA: Diagnosis not present

## 2018-03-01 DIAGNOSIS — I10 Essential (primary) hypertension: Secondary | ICD-10-CM | POA: Diagnosis not present

## 2018-03-01 DIAGNOSIS — M1990 Unspecified osteoarthritis, unspecified site: Secondary | ICD-10-CM | POA: Diagnosis not present

## 2018-03-01 DIAGNOSIS — E1142 Type 2 diabetes mellitus with diabetic polyneuropathy: Secondary | ICD-10-CM | POA: Diagnosis not present

## 2018-03-01 DIAGNOSIS — M069 Rheumatoid arthritis, unspecified: Secondary | ICD-10-CM | POA: Diagnosis not present

## 2018-03-15 DIAGNOSIS — J069 Acute upper respiratory infection, unspecified: Secondary | ICD-10-CM | POA: Diagnosis not present

## 2018-03-22 DIAGNOSIS — J90 Pleural effusion, not elsewhere classified: Secondary | ICD-10-CM | POA: Diagnosis not present

## 2018-03-22 DIAGNOSIS — R0602 Shortness of breath: Secondary | ICD-10-CM | POA: Diagnosis not present

## 2018-03-22 DIAGNOSIS — R05 Cough: Secondary | ICD-10-CM | POA: Diagnosis not present

## 2018-03-24 DIAGNOSIS — F0150 Vascular dementia without behavioral disturbance: Secondary | ICD-10-CM | POA: Diagnosis not present

## 2018-03-24 DIAGNOSIS — E1142 Type 2 diabetes mellitus with diabetic polyneuropathy: Secondary | ICD-10-CM | POA: Diagnosis not present

## 2018-03-24 DIAGNOSIS — N400 Enlarged prostate without lower urinary tract symptoms: Secondary | ICD-10-CM | POA: Diagnosis not present

## 2018-03-24 DIAGNOSIS — F333 Major depressive disorder, recurrent, severe with psychotic symptoms: Secondary | ICD-10-CM | POA: Diagnosis not present

## 2018-03-24 DIAGNOSIS — I70262 Atherosclerosis of native arteries of extremities with gangrene, left leg: Secondary | ICD-10-CM | POA: Diagnosis not present

## 2018-03-24 DIAGNOSIS — F411 Generalized anxiety disorder: Secondary | ICD-10-CM | POA: Diagnosis not present

## 2018-03-24 DIAGNOSIS — M1990 Unspecified osteoarthritis, unspecified site: Secondary | ICD-10-CM | POA: Diagnosis not present

## 2018-03-31 ENCOUNTER — Encounter: Payer: Self-pay | Admitting: *Deleted

## 2018-03-31 ENCOUNTER — Encounter: Payer: Self-pay | Admitting: Vascular Surgery

## 2018-03-31 ENCOUNTER — Other Ambulatory Visit: Payer: Self-pay | Admitting: *Deleted

## 2018-03-31 ENCOUNTER — Other Ambulatory Visit: Payer: Self-pay

## 2018-03-31 ENCOUNTER — Ambulatory Visit (INDEPENDENT_AMBULATORY_CARE_PROVIDER_SITE_OTHER): Payer: Medicare Other | Admitting: Vascular Surgery

## 2018-03-31 VITALS — BP 127/63 | HR 56 | Temp 97.4°F | Resp 18 | Ht 73.0 in | Wt 191.0 lb

## 2018-03-31 DIAGNOSIS — E785 Hyperlipidemia, unspecified: Secondary | ICD-10-CM | POA: Diagnosis not present

## 2018-03-31 DIAGNOSIS — Z79899 Other long term (current) drug therapy: Secondary | ICD-10-CM | POA: Diagnosis not present

## 2018-03-31 DIAGNOSIS — R569 Unspecified convulsions: Secondary | ICD-10-CM | POA: Diagnosis not present

## 2018-03-31 DIAGNOSIS — I70262 Atherosclerosis of native arteries of extremities with gangrene, left leg: Secondary | ICD-10-CM

## 2018-03-31 DIAGNOSIS — D649 Anemia, unspecified: Secondary | ICD-10-CM | POA: Diagnosis not present

## 2018-03-31 NOTE — H&P (View-Only) (Signed)
Patient name: Terry Green MRN: [id number] DOB: [birth year] Sex: male  REASON FOR VISIT:   Follow-up of left foot wounds.  HPI:   Terry Green is a pleasant 78 y.o. male who I last saw on 02/24/2018.  Patient has had amputation of left great toe and the left third toe.  These have been slow to improve.  He has undergone arteriography which showed no further options for revascularization.  He has no significant disease down to the popliteal artery which is then occluded below the knee.  The popliteal to peroneal artery bypass graft is patent with single-vessel runoff via the peroneal artery which stops at the ankle.  I felt that if the toe sites were not healing he might be considered for a transmetatarsal amputation.  The patient has been elevating his leg and this has helped significantly with the swelling in the left leg.  He denies significant pain in the left foot however he states the wounds are really not shown to much improvement otherwise.  He denies fever or chills.  Current Outpatient Medications  Medication Sig Dispense Refill  . amLODipine (NORVASC) 10 MG tablet Take 10 mg by mouth daily. (0900)    . aspirin EC 81 MG tablet Take 81 mg by mouth daily. (0900)    . atorvastatin (LIPITOR) 40 MG tablet Take 40 mg by mouth daily at 8 pm.    . busPIRone (BUSPAR) 10 MG tablet Take 10 mg by mouth daily. (0900)    . carvedilol (COREG) 25 MG tablet Take 25 mg by mouth 2 (two) times daily. (0900 & 2000)    . divalproex (DEPAKOTE) 250 MG DR tablet Take 500 mg by mouth 2 (two) times daily. (0900 & 2000)    . ferrous sulfate 325 (65 FE) MG tablet Take 325 mg by mouth daily with breakfast. (0900)    . fluticasone (FLONASE) 50 MCG/ACT nasal spray Place 2 sprays into both nostrils daily. (0800)    . gabapentin (NEURONTIN) 300 MG capsule Take 300 mg by mouth 3 (three) times daily. (0900, 1600, & 2000)    . guaiFENesin (ROBITUSSIN MUCUS+CHEST CONGEST) 100 MG/5ML liquid Take 200 mg by mouth every  4 (four) hours as needed for cough.    . hydrALAZINE (APRESOLINE) 50 MG tablet Take 50 mg by mouth 3 (three) times daily. (0800, 1600, & 2000)    . HYDROcodone-acetaminophen (NORCO/VICODIN) 5-325 MG tablet Take 1 tablet by mouth every 6 (six) hours as needed (for pain.).    . Insulin Glargine (BASAGLAR KWIKPEN) 100 UNIT/ML SOPN Inject 38 Units into the skin at bedtime. (2000)    . levofloxacin (LEVAQUIN) 500 MG tablet Take 1 tablet (500 mg total) by mouth daily. 14 tablet 0  . loratadine (CLARITIN) 10 MG tablet Take 10 mg by mouth daily. (0900)    . memantine (NAMENDA) 10 MG tablet Take 10 mg by mouth 2 (two) times daily. (0900 & 2000)    . Multiple Vitamins-Minerals (MULTIVITAMIN WITH MINERALS) tablet Take 1 tablet by mouth daily. (0900)    . NOVOLIN N 100 UNIT/ML injection Inject 55 Units into the skin 2 (two) times daily.    Marland Kitchen. oxybutynin (DITROPAN) 5 MG tablet Take 10 mg by mouth daily. (0900)    . polyethylene glycol powder (GLYCOLAX/MIRALAX) powder Take 17 g by mouth daily as needed (constipation).    . risperiDONE (RISPERDAL) 0.25 MG tablet Take 0.25 mg by mouth daily at 8 pm. (2000)    . sertraline (ZOLOFT) 100 MG  tablet Take 100 mg by mouth daily. (0900)    . tamsulosin (FLOMAX) 0.4 MG CAPS capsule Take 0.4 mg by mouth daily at 8 pm. (2000)     No current facility-administered medications for this visit.     REVIEW OF SYSTEMS:  [X]  denotes positive finding, [ ]  denotes negative finding Vascular    Leg swelling    Cardiac    Chest pain or chest pressure:    Shortness of breath upon exertion:    Short of breath when lying flat:    Irregular heart rhythm:    Constitutional    Fever or chills:     PHYSICAL EXAM:   Vitals:   03/31/18 1026  BP: 127/63  Pulse: (!) 56  Resp: 18  Temp: (!) 97.4 F (36.3 C)  TempSrc: Oral  SpO2: 96%  Weight: 191 lb (86.6 kg)  Height: 6\' 1"  (1.854 m)    GENERAL: The patient is a well-nourished male, in no acute distress. The vital signs are  documented above. CARDIOVASCULAR: There is a regular rate and rhythm. PULMONARY: There is good air exchange bilaterally without wheezing or rales. VASCULAR: He does have a brisk peroneal signal on the left with the Doppler so I think is popliteal to peroneal artery bypass graft is patent. EXTREMITIES: The wound on the right great toe was 2 cm x 2 cm which has improved.  The wound on the third toe measures 0.8 cm x 0.8 cm.  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  I explained that my main concern is the risk of nonhealing given that he has peroneal runoff only.  Certainly this puts him at risk for nonhealing in which case he would require a more proximal amputation.  He is agreeable to proceed and this has been scheduled for 04/06/2018.  All his questions were answered.  [name] [facility] [phone]

## 2018-03-31 NOTE — Progress Notes (Signed)
 Patient name: Terry Green MRN: [id number] DOB: 07/09/1940 Sex: male  REASON FOR VISIT:   Follow-up of left foot wounds.  HPI:   Terry Green is a pleasant 77 y.o. male who I last saw on 02/24/2018.  Patient has had amputation of left great toe and the left third toe.  These have been slow to improve.  He has undergone arteriography which showed no further options for revascularization.  He has no significant disease down to the popliteal artery which is then occluded below the knee.  The popliteal to peroneal artery bypass graft is patent with single-vessel runoff via the peroneal artery which stops at the ankle.  I felt that if the toe sites were not healing he might be considered for a transmetatarsal amputation.  The patient has been elevating his leg and this has helped significantly with the swelling in the left leg.  He denies significant pain in the left foot however he states the wounds are really not shown to much improvement otherwise.  He denies fever or chills.  Current Outpatient Medications  Medication Sig Dispense Refill  . amLODipine (NORVASC) 10 MG tablet Take 10 mg by mouth daily. (0900)    . aspirin EC 81 MG tablet Take 81 mg by mouth daily. (0900)    . atorvastatin (LIPITOR) 40 MG tablet Take 40 mg by mouth daily at 8 pm.    . busPIRone (BUSPAR) 10 MG tablet Take 10 mg by mouth daily. (0900)    . carvedilol (COREG) 25 MG tablet Take 25 mg by mouth 2 (two) times daily. (0900 & 2000)    . divalproex (DEPAKOTE) 250 MG DR tablet Take 500 mg by mouth 2 (two) times daily. (0900 & 2000)    . ferrous sulfate 325 (65 FE) MG tablet Take 325 mg by mouth daily with breakfast. (0900)    . fluticasone (FLONASE) 50 MCG/ACT nasal spray Place 2 sprays into both nostrils daily. (0800)    . gabapentin (NEURONTIN) 300 MG capsule Take 300 mg by mouth 3 (three) times daily. (0900, 1600, & 2000)    . guaiFENesin (ROBITUSSIN MUCUS+CHEST CONGEST) 100 MG/5ML liquid Take 200 mg by mouth every  4 (four) hours as needed for cough.    . hydrALAZINE (APRESOLINE) 50 MG tablet Take 50 mg by mouth 3 (three) times daily. (0800, 1600, & 2000)    . HYDROcodone-acetaminophen (NORCO/VICODIN) 5-325 MG tablet Take 1 tablet by mouth every 6 (six) hours as needed (for pain.).    . Insulin Glargine (BASAGLAR KWIKPEN) 100 UNIT/ML SOPN Inject 38 Units into the skin at bedtime. (2000)    . levofloxacin (LEVAQUIN) 500 MG tablet Take 1 tablet (500 mg total) by mouth daily. 14 tablet 0  . loratadine (CLARITIN) 10 MG tablet Take 10 mg by mouth daily. (0900)    . memantine (NAMENDA) 10 MG tablet Take 10 mg by mouth 2 (two) times daily. (0900 & 2000)    . Multiple Vitamins-Minerals (MULTIVITAMIN WITH MINERALS) tablet Take 1 tablet by mouth daily. (0900)    . NOVOLIN N 100 UNIT/ML injection Inject 55 Units into the skin 2 (two) times daily.    . oxybutynin (DITROPAN) 5 MG tablet Take 10 mg by mouth daily. (0900)    . polyethylene glycol powder (GLYCOLAX/MIRALAX) powder Take 17 g by mouth daily as needed (constipation).    . risperiDONE (RISPERDAL) 0.25 MG tablet Take 0.25 mg by mouth daily at 8 pm. (2000)    . sertraline (ZOLOFT) 100 MG   tablet Take 100 mg by mouth daily. (0900)    . tamsulosin (FLOMAX) 0.4 MG CAPS capsule Take 0.4 mg by mouth daily at 8 pm. (2000)     No current facility-administered medications for this visit.     REVIEW OF SYSTEMS:  [X]  denotes positive finding, [ ]  denotes negative finding Vascular    Leg swelling    Cardiac    Chest pain or chest pressure:    Shortness of breath upon exertion:    Short of breath when lying flat:    Irregular heart rhythm:    Constitutional    Fever or chills:     PHYSICAL EXAM:   Vitals:   03/31/18 1026  BP: 127/63  Pulse: (!) 56  Resp: 18  Temp: (!) 97.4 F (36.3 C)  TempSrc: Oral  SpO2: 96%  Weight: 191 lb (86.6 kg)  Height: 6\' 1"  (1.854 m)    GENERAL: The patient is a well-nourished male, in no acute distress. The vital signs are  documented above. CARDIOVASCULAR: There is a regular rate and rhythm. PULMONARY: There is good air exchange bilaterally without wheezing or rales. VASCULAR: He does have a brisk peroneal signal on the left with the Doppler so I think is popliteal to peroneal artery bypass graft is patent. EXTREMITIES: The wound on the right great toe was 2 cm x 2 cm which has improved.  The wound on the third toe measures 0.8 cm x 0.8 cm.  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  I explained that my main concern is the risk of nonhealing given that he has peroneal runoff only.  Certainly this puts him at risk for nonhealing in which case he would require a more proximal amputation.  He is agreeable to proceed and this has been scheduled for 04/06/2018.  All his questions were answered.  [name] [facility] [phone]

## 2018-04-05 ENCOUNTER — Encounter (HOSPITAL_COMMUNITY): Payer: Self-pay | Admitting: *Deleted

## 2018-04-05 ENCOUNTER — Other Ambulatory Visit: Payer: Self-pay

## 2018-04-05 NOTE — Pre-Procedure Instructions (Addendum)
   Terry Green  04/05/2018    [name]'s procedure is scheduled on Tuesday, 04/06/18 at 9:30 AM.   Report to [facility] Entrance "A" Admitting Office at 7:00 AM.   Call this number if you have problems the morning of surgery: [phone]   Remember:  Patient is not to eat or drink after midnight tonight.  Take these medicines the morning of surgery: Duo-neb - if needed  Monday evening please give pt 1/2 of his regular dose of Basaglar Insulin - give him 15 units. If his 2nd Novolin N dose is at dinner or in the evening please give 1/2 of that dose which would be 27 units. Please check his blood sugar Tuesday AM when he gets up and every 2 hours until he leaves for the hospital. If his blood sugar is 70 or above, give him 1/2 of his regular dose of Novolin N - again it would be 27 units. If blood sugar is 70 or below, treat with 1/2 cup of clear juice (apple or cranberry) and recheck blood sugar 15 minutes after drinking juice. If blood sugar continues to be 70 or below, call the Short Stay department and ask to speak to a nurse.  Per instructions Dr. Glade Stanford, Anesthesiologist/Cecile Guevara [employer], RN    Do not wear jewelry.  Do not wear lotions, powders, cologne or deodorant.  Men may shave face and neck.  Do not bring valuables to the hospital.  [facility] is not responsible for any belongings or valuables.  Contacts, dentures or bridgework may not be worn into surgery.    Any questions today please call me, Hyman Bible, RN at [phone].

## 2018-04-05 NOTE — Progress Notes (Signed)
Pt is a resident at [facility] in [place]. Spoke with Marlise Eves, nurse for pt. She states pt does have some dementia, but usually is alert and oriented. Pt has CAD listed on his medical history, but no cardiologist is noted anywhere and Rosalee doesn't know of one. Pt is a type 2 diabetic. Last A1C was 6.6 on 02/27/18 per Rosalee. She states his fasting blood sugar is usually between 70-80. She states pt is only able to take his medications with applesauce. Pt will not take any oral medications in the AM. Faxed pre-op instructions to Rosalee at [phone]. Instructions for insulin given on written instructions.

## 2018-04-05 NOTE — Anesthesia Preprocedure Evaluation (Addendum)
Anesthesia Evaluation  Patient identified by MRN, date of birth, ID band Patient awake    Reviewed: Allergy & Precautions, NPO status , Patient's Chart, lab work & pertinent test results, reviewed documented beta blocker date and time   History of Anesthesia Complications Negative for: history of anesthetic complications  Airway Mallampati: III  TM Distance: >3 FB Neck ROM: Full    Dental  (+) Lower Dentures, Upper Dentures   Pulmonary former smoker,    breath sounds clear to auscultation       Cardiovascular hypertension, Pt. on medications and Pt. on home beta blockers + CAD and + Peripheral Vascular Disease   Rhythm:Regular Rate:Normal     Neuro/Psych PSYCHIATRIC DISORDERS Anxiety Depression Dementia negative neurological ROS     GI/Hepatic negative GI ROS, Neg liver ROS,   Endo/Other  diabetes, Type 2, Insulin Dependent  Renal/GU negative Renal ROS     Musculoskeletal  (+) Arthritis , Rheumatoid disorders,    Abdominal   Peds  Hematology negative hematology ROS (+)   Anesthesia Other Findings   Reproductive/Obstetrics                            Anesthesia Physical Anesthesia Plan  ASA: III  Anesthesia Plan: General   Post-op Pain Management:  Regional for Post-op pain   Induction: Intravenous  PONV Risk Score and Plan: 3 and Treatment may vary due to age or medical condition and Ondansetron  Airway Management Planned: LMA  Additional Equipment: None  Intra-op Plan:   Post-operative Plan: Extubation in OR  Informed Consent: I have reviewed the patients History and Physical, chart, labs and discussed the procedure including the risks, benefits and alternatives for the proposed anesthesia with the patient or authorized representative who has indicated his/her understanding and acceptance.     Dental advisory given  Plan Discussed with: CRNA and  Anesthesiologist  Anesthesia Plan Comments:        Anesthesia Quick Evaluation

## 2018-04-06 ENCOUNTER — Inpatient Hospital Stay (HOSPITAL_COMMUNITY)
Admission: RE | Admit: 2018-04-06 | Discharge: 2018-04-09 | DRG: 241 | Disposition: A | Discharge status: Skilled Nursing Facility | Payer: Medicare Other | Source: Skilled Nursing Facility | Attending: Vascular Surgery | Admitting: Vascular Surgery

## 2018-04-06 ENCOUNTER — Encounter (HOSPITAL_COMMUNITY)
Admission: RE | Disposition: A | Discharge status: Skilled Nursing Facility | Payer: Self-pay | Source: Skilled Nursing Facility | Attending: Vascular Surgery

## 2018-04-06 ENCOUNTER — Encounter (HOSPITAL_COMMUNITY): Payer: Self-pay

## 2018-04-06 ENCOUNTER — Other Ambulatory Visit: Payer: Self-pay

## 2018-04-06 ENCOUNTER — Inpatient Hospital Stay (HOSPITAL_COMMUNITY): Payer: Medicare Other | Admitting: Anesthesiology

## 2018-04-06 DIAGNOSIS — E11649 Type 2 diabetes mellitus with hypoglycemia without coma: Secondary | ICD-10-CM | POA: Diagnosis not present

## 2018-04-06 DIAGNOSIS — Z7982 Long term (current) use of aspirin: Secondary | ICD-10-CM | POA: Diagnosis not present

## 2018-04-06 DIAGNOSIS — D509 Iron deficiency anemia, unspecified: Secondary | ICD-10-CM | POA: Diagnosis present

## 2018-04-06 DIAGNOSIS — Z79891 Long term (current) use of opiate analgesic: Secondary | ICD-10-CM | POA: Diagnosis not present

## 2018-04-06 DIAGNOSIS — F319 Bipolar disorder, unspecified: Secondary | ICD-10-CM | POA: Diagnosis present

## 2018-04-06 DIAGNOSIS — E7800 Pure hypercholesterolemia, unspecified: Secondary | ICD-10-CM | POA: Diagnosis present

## 2018-04-06 DIAGNOSIS — Z888 Allergy status to other drugs, medicaments and biological substances status: Secondary | ICD-10-CM | POA: Diagnosis not present

## 2018-04-06 DIAGNOSIS — E1152 Type 2 diabetes mellitus with diabetic peripheral angiopathy with gangrene: Secondary | ICD-10-CM | POA: Diagnosis present

## 2018-04-06 DIAGNOSIS — F068 Other specified mental disorders due to known physiological condition: Secondary | ICD-10-CM | POA: Diagnosis not present

## 2018-04-06 DIAGNOSIS — Z89511 Acquired absence of right leg below knee: Secondary | ICD-10-CM | POA: Diagnosis not present

## 2018-04-06 DIAGNOSIS — R262 Difficulty in walking, not elsewhere classified: Secondary | ICD-10-CM | POA: Diagnosis not present

## 2018-04-06 DIAGNOSIS — Z794 Long term (current) use of insulin: Secondary | ICD-10-CM

## 2018-04-06 DIAGNOSIS — Z7951 Long term (current) use of inhaled steroids: Secondary | ICD-10-CM | POA: Diagnosis not present

## 2018-04-06 DIAGNOSIS — I739 Peripheral vascular disease, unspecified: Secondary | ICD-10-CM | POA: Diagnosis not present

## 2018-04-06 DIAGNOSIS — I10 Essential (primary) hypertension: Secondary | ICD-10-CM | POA: Diagnosis present

## 2018-04-06 DIAGNOSIS — Z96642 Presence of left artificial hip joint: Secondary | ICD-10-CM | POA: Diagnosis not present

## 2018-04-06 DIAGNOSIS — F0390 Unspecified dementia without behavioral disturbance: Secondary | ICD-10-CM | POA: Diagnosis present

## 2018-04-06 DIAGNOSIS — F339 Major depressive disorder, recurrent, unspecified: Secondary | ICD-10-CM | POA: Diagnosis not present

## 2018-04-06 DIAGNOSIS — M9702XD Periprosthetic fracture around internal prosthetic left hip joint, subsequent encounter: Secondary | ICD-10-CM | POA: Diagnosis not present

## 2018-04-06 DIAGNOSIS — Z89422 Acquired absence of other left toe(s): Secondary | ICD-10-CM | POA: Diagnosis not present

## 2018-04-06 DIAGNOSIS — M069 Rheumatoid arthritis, unspecified: Secondary | ICD-10-CM | POA: Diagnosis present

## 2018-04-06 DIAGNOSIS — G8918 Other acute postprocedural pain: Secondary | ICD-10-CM | POA: Diagnosis not present

## 2018-04-06 DIAGNOSIS — I70262 Atherosclerosis of native arteries of extremities with gangrene, left leg: Secondary | ICD-10-CM | POA: Diagnosis not present

## 2018-04-06 DIAGNOSIS — M6281 Muscle weakness (generalized): Secondary | ICD-10-CM | POA: Diagnosis not present

## 2018-04-06 DIAGNOSIS — Z4781 Encounter for orthopedic aftercare following surgical amputation: Secondary | ICD-10-CM | POA: Diagnosis not present

## 2018-04-06 DIAGNOSIS — I2510 Atherosclerotic heart disease of native coronary artery without angina pectoris: Secondary | ICD-10-CM | POA: Diagnosis present

## 2018-04-06 DIAGNOSIS — N400 Enlarged prostate without lower urinary tract symptoms: Secondary | ICD-10-CM | POA: Diagnosis present

## 2018-04-06 DIAGNOSIS — E1142 Type 2 diabetes mellitus with diabetic polyneuropathy: Secondary | ICD-10-CM | POA: Diagnosis not present

## 2018-04-06 DIAGNOSIS — Z89412 Acquired absence of left great toe: Secondary | ICD-10-CM | POA: Diagnosis not present

## 2018-04-06 DIAGNOSIS — R279 Unspecified lack of coordination: Secondary | ICD-10-CM | POA: Diagnosis not present

## 2018-04-06 DIAGNOSIS — E785 Hyperlipidemia, unspecified: Secondary | ICD-10-CM | POA: Diagnosis not present

## 2018-04-06 DIAGNOSIS — D649 Anemia, unspecified: Secondary | ICD-10-CM | POA: Diagnosis not present

## 2018-04-06 DIAGNOSIS — R351 Nocturia: Secondary | ICD-10-CM | POA: Diagnosis not present

## 2018-04-06 DIAGNOSIS — F411 Generalized anxiety disorder: Secondary | ICD-10-CM | POA: Diagnosis not present

## 2018-04-06 DIAGNOSIS — Z79899 Other long term (current) drug therapy: Secondary | ICD-10-CM | POA: Diagnosis not present

## 2018-04-06 DIAGNOSIS — Z743 Need for continuous supervision: Secondary | ICD-10-CM | POA: Diagnosis not present

## 2018-04-06 DIAGNOSIS — M1990 Unspecified osteoarthritis, unspecified site: Secondary | ICD-10-CM | POA: Diagnosis not present

## 2018-04-06 HISTORY — PX: TRANSMETATARSAL AMPUTATION: SHX6197

## 2018-04-06 LAB — CBC
HCT: 40.5 % (ref 39.0–52.0)
HCT: 37.6 % — ABNORMAL LOW (ref 39.0–52.0)
Hemoglobin: 12.3 g/dL — ABNORMAL LOW (ref 13.0–17.0)
Hemoglobin: 11.7 g/dL — ABNORMAL LOW (ref 13.0–17.0)
MCH: 23 pg — ABNORMAL LOW (ref 26.0–34.0)
MCH: 23.3 pg — ABNORMAL LOW (ref 26.0–34.0)
MCHC: 30.4 g/dL (ref 30.0–36.0)
MCHC: 31.1 g/dL (ref 30.0–36.0)
MCV: 75.7 fL — AB (ref 80.0–100.0)
MCV: 74.9 fL — ABNORMAL LOW (ref 80.0–100.0)
Platelets: 244 10*3/uL (ref 150–400)
Platelets: 222 10*3/uL (ref 150–400)
RBC: 5.35 MIL/uL (ref 4.22–5.81)
RBC: 5.02 MIL/uL (ref 4.22–5.81)
RDW: 21.7 % — ABNORMAL HIGH (ref 11.5–15.5)
RDW: 21.2 % — ABNORMAL HIGH (ref 11.5–15.5)
WBC: 7.9 10*3/uL (ref 4.0–10.5)
WBC: 8.3 10*3/uL (ref 4.0–10.5)
nRBC: 0 % (ref 0.0–0.2)
nRBC: 0 % (ref 0.0–0.2)

## 2018-04-06 LAB — PROTIME-INR
INR: 1.17
Prothrombin Time: 14.8 seconds (ref 11.4–15.2)

## 2018-04-06 LAB — GLUCOSE, CAPILLARY
Glucose-Capillary: 199 mg/dL — ABNORMAL HIGH (ref 70–99)
Glucose-Capillary: 132 mg/dL — ABNORMAL HIGH (ref 70–99)
Glucose-Capillary: 111 mg/dL — ABNORMAL HIGH (ref 70–99)
Glucose-Capillary: 235 mg/dL — ABNORMAL HIGH (ref 70–99)
Glucose-Capillary: 290 mg/dL — ABNORMAL HIGH (ref 70–99)

## 2018-04-06 LAB — COMPREHENSIVE METABOLIC PANEL
ALBUMIN: 2.3 g/dL — AB (ref 3.5–5.0)
ALT: 23 U/L (ref 0–44)
AST: 25 U/L (ref 15–41)
Alkaline Phosphatase: 73 U/L (ref 38–126)
Anion gap: 10 (ref 5–15)
BUN: 21 mg/dL (ref 8–23)
CO2: 24 mmol/L (ref 22–32)
Calcium: 8.5 mg/dL — ABNORMAL LOW (ref 8.9–10.3)
Chloride: 100 mmol/L (ref 98–111)
Creatinine, Ser: 1.09 mg/dL (ref 0.61–1.24)
GFR calc Af Amer: >60 mL/min (ref >60)
GFR calc non Af Amer: >60 mL/min (ref >60)
Glucose, Bld: 117 mg/dL — ABNORMAL HIGH (ref 70–99)
Potassium: 4.6 mmol/L (ref 3.5–5.1)
Sodium: 134 mmol/L — ABNORMAL LOW (ref 135–145)
Total Bilirubin: 0.3 mg/dL (ref 0.3–1.2)
Total Protein: 8.5 g/dL — ABNORMAL HIGH (ref 6.5–8.1)

## 2018-04-06 LAB — URINALYSIS, ROUTINE W REFLEX MICROSCOPIC
Bilirubin Urine: NEGATIVE
Glucose, UA: NEGATIVE mg/dL
HGB URINE DIPSTICK: NEGATIVE
Ketones, ur: NEGATIVE mg/dL
Leukocytes, UA: NEGATIVE
Nitrite: NEGATIVE
Protein, ur: NEGATIVE mg/dL
Specific Gravity, Urine: 1.012 (ref 1.005–1.030)
pH: 6 (ref 5.0–8.0)

## 2018-04-06 LAB — BASIC METABOLIC PANEL
Anion gap: 10 (ref 5–15)
BUN: 21 mg/dL (ref 8–23)
CO2: 22 mmol/L (ref 22–32)
Calcium: 9 mg/dL (ref 8.9–10.3)
Chloride: 101 mmol/L (ref 98–111)
Creatinine, Ser: 1.11 mg/dL (ref 0.61–1.24)
GFR calc Af Amer: >60 mL/min (ref >60)
GFR calc non Af Amer: >60 mL/min (ref >60)
GLUCOSE: 208 mg/dL — AB (ref 70–99)
Potassium: 4.2 mmol/L (ref 3.5–5.1)
Sodium: 133 mmol/L — ABNORMAL LOW (ref 135–145)

## 2018-04-06 SURGERY — AMPUTATION, FOOT, TRANSMETATARSAL
Anesthesia: General | Site: Foot | Laterality: Left

## 2018-04-06 MED ORDER — OXYCODONE HCL 5 MG/5ML PO SOLN: 5.0000 mg | Freq: Once | ORAL | Status: DC | PRN

## 2018-04-06 MED ORDER — DIVALPROEX SODIUM 500 MG PO DR TAB
500.0000 mg | DELAYED_RELEASE_TABLET | Freq: Two times a day (BID) | ORAL | Status: DC
  Administered 2018-04-06 – 2018-04-09 (×7): 500 mg via ORAL
  Filled 2018-04-06 (×8): qty 1

## 2018-04-06 MED ORDER — DEXAMETHASONE SODIUM PHOSPHATE 10 MG/ML IJ SOLN
INTRAMUSCULAR | Status: DC | PRN
  Administered 2018-04-06: 10 mg via INTRAVENOUS

## 2018-04-06 MED ORDER — GUAIFENESIN 100 MG/5ML PO SOLN: 200.0000 mg | ORAL | Status: DC | PRN

## 2018-04-06 MED ORDER — FERROUS SULFATE 325 (65 FE) MG PO TABS
325.0000 mg | ORAL_TABLET | Freq: Two times a day (BID) | ORAL | Status: DC
  Administered 2018-04-06 – 2018-04-09 (×7): 325 mg via ORAL
  Filled 2018-04-06 (×7): qty 1

## 2018-04-06 MED ORDER — POTASSIUM CHLORIDE CRYS ER 20 MEQ PO TBCR: 20.0000 meq | EXTENDED_RELEASE_TABLET | Freq: Once | ORAL | Status: DC

## 2018-04-06 MED ORDER — 0.9 % SODIUM CHLORIDE (POUR BTL) OPTIME
TOPICAL | Status: DC | PRN
  Administered 2018-04-06: 1000 mL

## 2018-04-06 MED ORDER — PHENYLEPHRINE 40 MCG/ML (10ML) SYRINGE FOR IV PUSH (FOR BLOOD PRESSURE SUPPORT)
PREFILLED_SYRINGE | INTRAVENOUS | Status: DC | PRN
  Administered 2018-04-06 (×5): 80 ug via INTRAVENOUS

## 2018-04-06 MED ORDER — POLYETHYLENE GLYCOL 3350 17 GM/SCOOP PO POWD: 17.0000 g | Freq: Every day | ORAL | Status: DC | PRN

## 2018-04-06 MED ORDER — GABAPENTIN 300 MG PO CAPS
300.0000 mg | ORAL_CAPSULE | Freq: Three times a day (TID) | ORAL | Status: DC
  Administered 2018-04-06 – 2018-04-09 (×8): 300 mg via ORAL
  Filled 2018-04-06 (×8): qty 1

## 2018-04-06 MED ORDER — ACETAMINOPHEN 325 MG RE SUPP: 325.0000 mg | RECTAL | Status: DC | PRN

## 2018-04-06 MED ORDER — ACETAMINOPHEN 325 MG PO TABS: 325.0000 mg | ORAL_TABLET | ORAL | Status: DC | PRN

## 2018-04-06 MED ORDER — INSULIN NPH (HUMAN) (ISOPHANE) 100 UNIT/ML ~~LOC~~ SUSP
55.0000 [IU] | Freq: Two times a day (BID) | SUBCUTANEOUS | Status: DC
  Administered 2018-04-06 – 2018-04-07 (×3): 55 [IU] via SUBCUTANEOUS
  Filled 2018-04-06: qty 10

## 2018-04-06 MED ORDER — ASPIRIN EC 81 MG PO TBEC
81.0000 mg | DELAYED_RELEASE_TABLET | Freq: Every day | ORAL | Status: DC
  Administered 2018-04-06 – 2018-04-09 (×4): 81 mg via ORAL
  Filled 2018-04-06 (×4): qty 1

## 2018-04-06 MED ORDER — BACITRACIN ZINC 500 UNIT/GM EX OINT
TOPICAL_OINTMENT | CUTANEOUS | Status: DC | PRN
  Administered 2018-04-06: 1 via TOPICAL

## 2018-04-06 MED ORDER — LORATADINE 10 MG PO TABS
10.0000 mg | ORAL_TABLET | Freq: Every day | ORAL | Status: DC
  Administered 2018-04-06 – 2018-04-09 (×4): 10 mg via ORAL
  Filled 2018-04-06 (×4): qty 1

## 2018-04-06 MED ORDER — SODIUM CHLORIDE 0.9 % IV SOLN: INTRAVENOUS | Status: DC

## 2018-04-06 MED ORDER — GLYCOPYRROLATE PF 0.2 MG/ML IJ SOSY
PREFILLED_SYRINGE | INTRAMUSCULAR | Status: DC | PRN
  Administered 2018-04-06 (×2): .2 mg via INTRAVENOUS

## 2018-04-06 MED ORDER — CARVEDILOL 25 MG PO TABS
25.0000 mg | ORAL_TABLET | Freq: Two times a day (BID) | ORAL | Status: DC
  Administered 2018-04-06 – 2018-04-09 (×6): 25 mg via ORAL
  Filled 2018-04-06 (×6): qty 1

## 2018-04-06 MED ORDER — SODIUM CHLORIDE 0.9 % IV SOLN
INTRAVENOUS | Status: DC
  Administered 2018-04-06 – 2018-04-07 (×2): via INTRAVENOUS

## 2018-04-06 MED ORDER — ALUM & MAG HYDROXIDE-SIMETH 200-200-20 MG/5ML PO SUSP: 15.0000 mL | ORAL | Status: DC | PRN

## 2018-04-06 MED ORDER — CEFAZOLIN SODIUM-DEXTROSE 2-4 GM/100ML-% IV SOLN
2.0000 g | INTRAVENOUS | Status: AC
  Administered 2018-04-06: 2 g via INTRAVENOUS
  Filled 2018-04-06: qty 100

## 2018-04-06 MED ORDER — HYDROMORPHONE HCL 1 MG/ML IJ SOLN: 0.5000 mg | INTRAMUSCULAR | Status: DC | PRN

## 2018-04-06 MED ORDER — VITAMIN C 500 MG PO TABS
500.0000 mg | ORAL_TABLET | Freq: Two times a day (BID) | ORAL | Status: DC
  Administered 2018-04-06 – 2018-04-09 (×7): 500 mg via ORAL
  Filled 2018-04-06 (×7): qty 1

## 2018-04-06 MED ORDER — IPRATROPIUM-ALBUTEROL 0.5-2.5 (3) MG/3ML IN SOLN: 3.0000 mL | Freq: Four times a day (QID) | RESPIRATORY_TRACT | Status: DC | PRN

## 2018-04-06 MED ORDER — ONDANSETRON HCL 4 MG/2ML IJ SOLN: 4.0000 mg | Freq: Four times a day (QID) | INTRAMUSCULAR | Status: DC | PRN

## 2018-04-06 MED ORDER — GUAIFENESIN-DM 100-10 MG/5ML PO SYRP: 15.0000 mL | ORAL_SOLUTION | ORAL | Status: DC | PRN

## 2018-04-06 MED ORDER — HYDRALAZINE HCL 50 MG PO TABS
50.0000 mg | ORAL_TABLET | Freq: Three times a day (TID) | ORAL | Status: DC
  Administered 2018-04-06 – 2018-04-09 (×8): 50 mg via ORAL
  Filled 2018-04-06 (×8): qty 1

## 2018-04-06 MED ORDER — VASOPRESSIN 20 UNIT/ML IV SOLN
INTRAVENOUS | Status: DC | PRN
  Administered 2018-04-06: 1 [IU] via INTRAVENOUS
  Administered 2018-04-06: 2 [IU] via INTRAVENOUS

## 2018-04-06 MED ORDER — ATORVASTATIN CALCIUM 40 MG PO TABS
40.0000 mg | ORAL_TABLET | Freq: Every day | ORAL | Status: DC
  Administered 2018-04-06 – 2018-04-08 (×3): 40 mg via ORAL
  Filled 2018-04-06 (×3): qty 1

## 2018-04-06 MED ORDER — LACTATED RINGERS IV SOLN
INTRAVENOUS | Status: DC | PRN
  Administered 2018-04-06: 09:00:00 via INTRAVENOUS

## 2018-04-06 MED ORDER — TAMSULOSIN HCL 0.4 MG PO CAPS
0.4000 mg | ORAL_CAPSULE | Freq: Every day | ORAL | Status: DC
  Administered 2018-04-06 – 2018-04-08 (×3): 0.4 mg via ORAL
  Filled 2018-04-06 (×3): qty 1

## 2018-04-06 MED ORDER — CEFAZOLIN SODIUM-DEXTROSE 2-4 GM/100ML-% IV SOLN
2.0000 g | Freq: Three times a day (TID) | INTRAVENOUS | Status: DC
  Administered 2018-04-06 – 2018-04-07 (×3): 2 g via INTRAVENOUS
  Filled 2018-04-06 (×3): qty 100

## 2018-04-06 MED ORDER — ACETAMINOPHEN 325 MG PO TABS: 650.0000 mg | ORAL_TABLET | ORAL | Status: DC | PRN

## 2018-04-06 MED ORDER — DEXAMETHASONE SODIUM PHOSPHATE 10 MG/ML IJ SOLN
INTRAMUSCULAR | Status: AC
  Filled 2018-04-06: qty 1

## 2018-04-06 MED ORDER — FLUTICASONE PROPIONATE 50 MCG/ACT NA SUSP
2.0000 | Freq: Every day | NASAL | Status: DC
  Administered 2018-04-06 – 2018-04-09 (×4): 2 via NASAL
  Filled 2018-04-06: qty 16

## 2018-04-06 MED ORDER — PROPOFOL 10 MG/ML IV BOLUS
INTRAVENOUS | Status: DC | PRN
  Administered 2018-04-06: 120 mg via INTRAVENOUS

## 2018-04-06 MED ORDER — LACTATED RINGERS IV SOLN: INTRAVENOUS | Status: DC

## 2018-04-06 MED ORDER — LABETALOL HCL 5 MG/ML IV SOLN: 10.0000 mg | INTRAVENOUS | Status: DC | PRN

## 2018-04-06 MED ORDER — BUSPIRONE HCL 5 MG PO TABS
7.5000 mg | ORAL_TABLET | Freq: Every day | ORAL | Status: DC
  Administered 2018-04-06 – 2018-04-09 (×4): 7.5 mg via ORAL
  Filled 2018-04-06 (×5): qty 2

## 2018-04-06 MED ORDER — SERTRALINE HCL 100 MG PO TABS
100.0000 mg | ORAL_TABLET | Freq: Every day | ORAL | Status: DC
  Administered 2018-04-06 – 2018-04-09 (×4): 100 mg via ORAL
  Filled 2018-04-06 (×4): qty 1

## 2018-04-06 MED ORDER — OXYCODONE HCL 5 MG PO TABS: 5.0000 mg | ORAL_TABLET | Freq: Once | ORAL | Status: DC | PRN

## 2018-04-06 MED ORDER — ONDANSETRON HCL 4 MG/2ML IJ SOLN
INTRAMUSCULAR | Status: AC
  Filled 2018-04-06: qty 2

## 2018-04-06 MED ORDER — CHLORHEXIDINE GLUCONATE 4 % EX LIQD: 60.0000 mL | Freq: Once | CUTANEOUS | Status: DC

## 2018-04-06 MED ORDER — PANTOPRAZOLE SODIUM 40 MG PO TBEC
40.0000 mg | DELAYED_RELEASE_TABLET | Freq: Every day | ORAL | Status: DC
  Administered 2018-04-06 – 2018-04-09 (×4): 40 mg via ORAL
  Filled 2018-04-06 (×4): qty 1

## 2018-04-06 MED ORDER — VASOPRESSIN 20 UNIT/ML IV SOLN
INTRAVENOUS | Status: AC
  Filled 2018-04-06: qty 1

## 2018-04-06 MED ORDER — PROPOFOL 10 MG/ML IV BOLUS
INTRAVENOUS | Status: AC
  Filled 2018-04-06: qty 20

## 2018-04-06 MED ORDER — ENOXAPARIN SODIUM 40 MG/0.4ML ~~LOC~~ SOLN
40.0000 mg | SUBCUTANEOUS | Status: DC
  Administered 2018-04-07 – 2018-04-09 (×3): 40 mg via SUBCUTANEOUS
  Filled 2018-04-06 (×3): qty 0.4

## 2018-04-06 MED ORDER — HYDRALAZINE HCL 20 MG/ML IJ SOLN: 5.0000 mg | INTRAMUSCULAR | Status: DC | PRN

## 2018-04-06 MED ORDER — HYDROCODONE-ACETAMINOPHEN 5-325 MG PO TABS: 1.0000 | ORAL_TABLET | Freq: Four times a day (QID) | ORAL | Status: DC | PRN

## 2018-04-06 MED ORDER — LIDOCAINE 2% (20 MG/ML) 5 ML SYRINGE
INTRAMUSCULAR | Status: AC
  Filled 2018-04-06: qty 5

## 2018-04-06 MED ORDER — MEMANTINE HCL 10 MG PO TABS
10.0000 mg | ORAL_TABLET | Freq: Two times a day (BID) | ORAL | Status: DC
  Administered 2018-04-06 – 2018-04-09 (×7): 10 mg via ORAL
  Filled 2018-04-06 (×7): qty 1

## 2018-04-06 MED ORDER — BACITRACIN ZINC 500 UNIT/GM EX OINT
TOPICAL_OINTMENT | CUTANEOUS | Status: AC
  Filled 2018-04-06: qty 28.35

## 2018-04-06 MED ORDER — EPHEDRINE SULFATE-NACL 50-0.9 MG/10ML-% IV SOSY
PREFILLED_SYRINGE | INTRAVENOUS | Status: DC | PRN
  Administered 2018-04-06 (×2): 10 mg via INTRAVENOUS
  Administered 2018-04-06: 20 mg via INTRAVENOUS
  Administered 2018-04-06: 10 mg via INTRAVENOUS

## 2018-04-06 MED ORDER — INSULIN GLARGINE 100 UNIT/ML ~~LOC~~ SOLN
30.0000 [IU] | Freq: Every day | SUBCUTANEOUS | Status: DC
  Administered 2018-04-06 – 2018-04-07 (×2): 30 [IU] via SUBCUTANEOUS
  Filled 2018-04-06 (×4): qty 0.3

## 2018-04-06 MED ORDER — GLYCOPYRROLATE PF 0.2 MG/ML IJ SOSY
PREFILLED_SYRINGE | INTRAMUSCULAR | Status: AC
  Filled 2018-04-06: qty 2

## 2018-04-06 MED ORDER — OXYBUTYNIN CHLORIDE 5 MG PO TABS
10.0000 mg | ORAL_TABLET | Freq: Every day | ORAL | Status: DC
  Administered 2018-04-06 – 2018-04-09 (×4): 10 mg via ORAL
  Filled 2018-04-06 (×4): qty 2

## 2018-04-06 MED ORDER — ONDANSETRON HCL 4 MG/2ML IJ SOLN: 4.0000 mg | Freq: Once | INTRAMUSCULAR | Status: DC | PRN

## 2018-04-06 MED ORDER — AMLODIPINE BESYLATE 10 MG PO TABS
10.0000 mg | ORAL_TABLET | Freq: Every day | ORAL | Status: DC
  Administered 2018-04-06 – 2018-04-09 (×4): 10 mg via ORAL
  Filled 2018-04-06 (×4): qty 1

## 2018-04-06 MED ORDER — ROPIVACAINE HCL 7.5 MG/ML IJ SOLN
INTRAMUSCULAR | Status: DC | PRN
  Administered 2018-04-06: 20 mL via PERINEURAL

## 2018-04-06 MED ORDER — ONDANSETRON HCL 4 MG/2ML IJ SOLN
INTRAMUSCULAR | Status: DC | PRN
  Administered 2018-04-06: 4 mg via INTRAVENOUS

## 2018-04-06 MED ORDER — PRO-STAT SUGAR FREE PO LIQD
30.0000 mL | Freq: Every day | ORAL | Status: DC
  Administered 2018-04-06 – 2018-04-07 (×2): 30 mL via ORAL
  Filled 2018-04-06 (×2): qty 30

## 2018-04-06 MED ORDER — OXYCODONE-ACETAMINOPHEN 5-325 MG PO TABS: 1.0000 | ORAL_TABLET | ORAL | Status: DC | PRN

## 2018-04-06 MED ORDER — PHENOL 1.4 % MT LIQD: 1.0000 | OROMUCOSAL | Status: DC | PRN

## 2018-04-06 MED ORDER — ADULT MULTIVITAMIN W/MINERALS CH
1.0000 | ORAL_TABLET | Freq: Every day | ORAL | Status: DC
  Administered 2018-04-06 – 2018-04-09 (×4): 1 via ORAL
  Filled 2018-04-06 (×4): qty 1

## 2018-04-06 MED ORDER — METOPROLOL TARTRATE 5 MG/5ML IV SOLN: 2.0000 mg | INTRAVENOUS | Status: DC | PRN

## 2018-04-06 MED ORDER — LIDOCAINE HCL (CARDIAC) PF 100 MG/5ML IV SOSY
PREFILLED_SYRINGE | INTRAVENOUS | Status: DC | PRN
  Administered 2018-04-06: 40 mg via INTRAVENOUS

## 2018-04-06 MED ORDER — FENTANYL CITRATE (PF) 100 MCG/2ML IJ SOLN: 25.0000 ug | INTRAMUSCULAR | Status: DC | PRN

## 2018-04-06 MED ORDER — EPHEDRINE 5 MG/ML INJ
INTRAVENOUS | Status: AC
  Filled 2018-04-06: qty 10

## 2018-04-06 MED ORDER — FENTANYL CITRATE (PF) 100 MCG/2ML IJ SOLN
100.0000 ug | Freq: Once | INTRAMUSCULAR | Status: AC
  Administered 2018-04-06: 50 ug via INTRAVENOUS
  Filled 2018-04-06: qty 2

## 2018-04-06 MED ORDER — MIDAZOLAM HCL 2 MG/2ML IJ SOLN
2.0000 mg | Freq: Once | INTRAMUSCULAR | Status: DC
  Filled 2018-04-06: qty 2

## 2018-04-06 SURGICAL SUPPLY — 37 items
BANDAGE ACE 4X5 VEL STRL LF (GAUZE/BANDAGES/DRESSINGS) ×3 IMPLANT
BANDAGE ELASTIC 4 VELCRO ST LF (GAUZE/BANDAGES/DRESSINGS) ×3 IMPLANT
BANDAGE ELASTIC 6 VELCRO ST LF (GAUZE/BANDAGES/DRESSINGS) IMPLANT
BLADE AVERAGE 25MMX9MM (BLADE) ×1
BLADE AVERAGE 25X9 (BLADE) ×2 IMPLANT
BNDG CONFORM 3 STRL LF (GAUZE/BANDAGES/DRESSINGS) ×3 IMPLANT
BNDG GAUZE ELAST 4 BULKY (GAUZE/BANDAGES/DRESSINGS) ×3 IMPLANT
CANISTER SUCT 3000ML PPV (MISCELLANEOUS) ×3 IMPLANT
CLIP VESOCCLUDE MED LG 24/CT (CLIP) ×3 IMPLANT
CLIP VESOCCLUDE SM WIDE 6/CT (CLIP) ×3 IMPLANT
COVER SURGICAL LIGHT HANDLE (MISCELLANEOUS) ×3 IMPLANT
COVER WAND RF STERILE (DRAPES) IMPLANT
DRAPE HALF SHEET 40X57 (DRAPES) ×3 IMPLANT
DRAPE ORTHO SPLIT 77X108 STRL (DRAPES) ×4
DRAPE SURG ORHT 6 SPLT 77X108 (DRAPES) ×2 IMPLANT
DRSG ADAPTIC 3X8 NADH LF (GAUZE/BANDAGES/DRESSINGS) ×3 IMPLANT
ELECT REM PT RETURN 9FT ADLT (ELECTROSURGICAL) ×3
ELECTRODE REM PT RTRN 9FT ADLT (ELECTROSURGICAL) ×1 IMPLANT
GAUZE SPONGE 4X4 12PLY STRL (GAUZE/BANDAGES/DRESSINGS) ×3 IMPLANT
GLOVE BIO SURGEON STRL SZ7.5 (GLOVE) ×3 IMPLANT
GLOVE BIOGEL PI IND STRL 8 (GLOVE) ×1 IMPLANT
GLOVE BIOGEL PI INDICATOR 8 (GLOVE) ×2
GOWN STRL REUS W/ TWL LRG LVL3 (GOWN DISPOSABLE) ×3 IMPLANT
GOWN STRL REUS W/TWL LRG LVL3 (GOWN DISPOSABLE) ×6
KIT BASIN OR (CUSTOM PROCEDURE TRAY) ×3 IMPLANT
KIT TURNOVER KIT B (KITS) ×3 IMPLANT
NS IRRIG 1000ML POUR BTL (IV SOLUTION) ×3 IMPLANT
PACK GENERAL/GYN (CUSTOM PROCEDURE TRAY) ×3 IMPLANT
PAD ARMBOARD 7.5X6 YLW CONV (MISCELLANEOUS) ×6 IMPLANT
STAPLER VISISTAT 35W (STAPLE) ×3 IMPLANT
SUT ETHILON 3 0 PS 1 (SUTURE) ×9 IMPLANT
SUT VIC AB 3-0 SH 27 (SUTURE) ×4
SUT VIC AB 3-0 SH 27X BRD (SUTURE) ×2 IMPLANT
TOWEL GREEN STERILE (TOWEL DISPOSABLE) ×6 IMPLANT
TOWEL GREEN STERILE FF (TOWEL DISPOSABLE) ×3 IMPLANT
UNDERPAD 30X30 (UNDERPADS AND DIAPERS) ×3 IMPLANT
WATER STERILE IRR 1000ML POUR (IV SOLUTION) ×3 IMPLANT

## 2018-04-06 NOTE — Anesthesia Procedure Notes (Signed)
Anesthesia Regional Block: Popliteal block   Pre-Anesthetic Checklist: ,, timeout performed, Correct Patient, Correct Site, Correct Laterality, Correct Procedure, Correct Position, site marked, Risks and benefits discussed,  Surgical consent,  Pre-op evaluation,  At surgeon's request and post-op pain management  Laterality: Right  Prep: chloraprep       Needles:  Injection technique: Single-shot  Needle Type: Echogenic Needle     Needle Length: 10cm  Needle Gauge: 21     Additional Needles:   Narrative:  Start time: 04/06/2018 8:38 AM End time: 04/06/2018 8:42 AM Injection made incrementally with aspirations every 5 mL.  Performed by: Personally  Anesthesiologist: [name], MD  Additional Notes: No pain on injection. No increased resistance to injection. Injection made in 5cc increments. Good needle visualization. Patient tolerated the procedure well.

## 2018-04-06 NOTE — Interval H&P Note (Signed)
History and Physical Interval Note:  04/06/2018 8:45 AM  Terry Green  has presented today for surgery, with the diagnosis of GANGRENE LEFT FOOT  The various methods of treatment have been discussed with the patient and family. After consideration of risks, benefits and other options for treatment, the patient has consented to  Procedure(s): TRANSMETATARSAL AMPUTATION (Left) as a surgical intervention .  The patient's history has been reviewed, patient examined, no change in status, stable for surgery.  I have reviewed the patient's chart and labs.  Questions were answered to the patient's satisfaction.     [name]

## 2018-04-06 NOTE — Plan of Care (Signed)
  Problem: Education: Goal: Knowledge of General Education information will improve Description Including pain rating scale, medication(s)/side effects and non-pharmacologic comfort measures Outcome: Progressing   Problem: Education: Goal: Knowledge of General Education information will improve Description Including pain rating scale, medication(s)/side effects and non-pharmacologic comfort measures Outcome: Progressing   Problem: Health Behavior/Discharge Planning: Goal: Ability to manage health-related needs will improve Outcome: Progressing   Problem: Clinical Measurements: Goal: Ability to maintain clinical measurements within normal limits will improve Outcome: Progressing   

## 2018-04-06 NOTE — Transfer of Care (Signed)
Immediate Anesthesia Transfer of Care Note  Patient: Terry Green  Procedure(s) Performed: TRANSMETATARSAL AMPUTATION (Left Foot)  Patient Location: PACU  Anesthesia Type:GA combined with regional for post-op pain  Level of Consciousness: awake, alert  and oriented  Airway & Oxygen Therapy: Patient Spontanous Breathing and Patient connected to face mask oxygen  Post-op Assessment: Report given to RN and Post -op Vital signs reviewed and stable  Post vital signs: Reviewed and stable  Last Vitals:  Vitals Value Taken Time  BP 134/66 04/06/2018 10:24 AM  Temp 36.2 C 04/06/2018 10:24 AM  Pulse 51 04/06/2018 10:25 AM  Resp 18 04/06/2018 10:25 AM  SpO2 98 % 04/06/2018 10:25 AM  Vitals shown include unvalidated device data.  Last Pain:  Vitals:   04/06/18 0744  TempSrc: Oral  PainSc: 0-No pain      Patients Stated Pain Goal: 0 (04/06/18 0744)  Complications: No apparent anesthesia complications

## 2018-04-06 NOTE — Anesthesia Procedure Notes (Signed)
Procedure Name: LMA Insertion Date/Time: 04/06/2018 9:42 AM Performed by: Izola Price., CRNA Pre-anesthesia Checklist: Patient identified, Emergency Drugs available, Suction available and Patient being monitored Patient Re-evaluated:Patient Re-evaluated prior to induction Oxygen Delivery Method: Circle system utilized Preoxygenation: Pre-oxygenation with 100% oxygen Induction Type: IV induction LMA: LMA inserted LMA Size: 5.0 Number of attempts: 1 Placement Confirmation: breath sounds checked- equal and bilateral and positive ETCO2 Tube secured with: Tape Dental Injury: Teeth and Oropharynx as per pre-operative assessment

## 2018-04-06 NOTE — Progress Notes (Signed)
Patient takes coreg BID.  He can only take medications with apple sauce and did not have it this morning.  His heart rate is 55 BPM.  Will not give coreg this morning

## 2018-04-06 NOTE — Progress Notes (Signed)
Pt received from PACU. VSS. L transmetatarsal compression clean, dry and intact. Lunch tray ordered. Pt oriented to room and unit. Will continue to monitor.  Versie Starks, RN

## 2018-04-06 NOTE — Anesthesia Postprocedure Evaluation (Signed)
Anesthesia Post Note  Patient: Terry Green  Procedure(s) Performed: TRANSMETATARSAL AMPUTATION (Left Foot)     Patient location during evaluation: PACU Anesthesia Type: General Level of consciousness: awake and alert Pain management: pain level controlled Vital Signs Assessment: post-procedure vital signs reviewed and stable Respiratory status: spontaneous breathing, nonlabored ventilation and respiratory function stable Cardiovascular status: blood pressure returned to baseline and stable Postop Assessment: no apparent nausea or vomiting Anesthetic complications: no    Last Vitals:  Vitals:   04/06/18 1112 04/06/18 1222  BP: 126/61 128/61  Pulse: (!) 49 (!) 50  Resp: 19 17  Temp: 36.5 C   SpO2: 97%     Last Pain:  Vitals:   04/06/18 1112  TempSrc: Oral  PainSc:                  [name]

## 2018-04-06 NOTE — Op Note (Signed)
    NAME: Terry Green    MRN: [id number] DOB: 27-Sep-1940    DATE OF OPERATION: 04/06/2018  PREOP DIAGNOSIS:    Nonhealing wounds of left foot  POSTOP DIAGNOSIS:    Same  PROCEDURE:    Left transmetatarsal amputation  SURGEON: Di Kindle. Edilia Bo, MD, FACS  ASSIST: None  ANESTHESIA: General  EBL: Minimal  INDICATIONS:    Terry Green is a 78 y.o. male who had undergone previous first and third toe amputations of the left foot.  He is also had a previous pop to peroneal bypass which is patent.  Given that he has peroneal artery runoff only it was felt that he was at risk for nonhealing a transmetatarsal amputation but given that these other wounds had shown no progress we elected to proceed with transmetatarsal amputation.  FINDINGS:   There was good bleeding at the surgical site.  TECHNIQUE:   The patient was taken to the operating room and received a general anesthetic.  The left foot was prepped and draped in usual sterile fashion.  An incision was made encompassing the 5 toes over the metatarsal head with a long plantar flap.  The dissection was carried down through the marked incision circumferentially.  On the dorsum of the foot the dissection was carried down to the metatarsal bones.  The periosteum was elevated.  Each of the bones was individually divided with a CD4 saw.  I did bevel the plantar aspect of the bone.  The dissection was continued removing the entire forefoot.  The digital arteries were controlled with small clips.  Additional hemostasis was obtained using electrocautery.  The tendons were retracted into the wounds and divided.  Hemostasis was obtained in the wound and the wound was irrigated.  The wound was then closed with interrupted 3-0 Vicryl's and the skin closed with staples.  Sterile dressing was applied.  The patient tolerated the procedure well was transferred to the recovery room in stable condition.  All needle and sponge counts were  correct.  [name], MD, FACS [facility]  DATE OF DICTATION:   04/06/2018

## 2018-04-07 ENCOUNTER — Encounter (HOSPITAL_COMMUNITY): Payer: Self-pay | Admitting: Vascular Surgery

## 2018-04-07 LAB — GLUCOSE, CAPILLARY
GLUCOSE-CAPILLARY: 110 mg/dL — AB (ref 70–99)
Glucose-Capillary: 141 mg/dL — ABNORMAL HIGH (ref 70–99)
Glucose-Capillary: 84 mg/dL (ref 70–99)
Glucose-Capillary: 84 mg/dL (ref 70–99)

## 2018-04-07 MED ORDER — PRO-STAT SUGAR FREE PO LIQD
30.0000 mL | Freq: Two times a day (BID) | ORAL | Status: DC
  Administered 2018-04-07 – 2018-04-09 (×4): 30 mL via ORAL
  Filled 2018-04-07 (×4): qty 30

## 2018-04-07 NOTE — Care Management Note (Signed)
Case Management Note Donn Pierini RN, BSN Transitions of Care Unit 4E- RN Case Manager [phone]  Patient Details  Name: Terry Green MRN: [id number] Date of Birth: 1940-10-19  Subjective/Objective:   Pt admitted with non healing wound s/p TMA                 Action/Plan: PTA pt was at [facility], CSW consulted for return to SNF. PT/OT evals pending.   Expected Discharge Date:                  Expected Discharge Plan:  Skilled Nursing Facility  In-House Referral:  Clinical Social Work  Discharge planning Services  CM Consult  Post Acute Care Choice:    Choice offered to:     DME Arranged:    DME Agency:     HH Arranged:    HH Agency:     Status of Service:  In process, will continue to follow  If discussed at Long Length of Stay Meetings, dates discussed:    Discharge Disposition:   Additional Comments:  Darrold Span, RN 04/07/2018, 10:47 AM

## 2018-04-07 NOTE — Progress Notes (Signed)
   VASCULAR SURGERY ASSESSMENT & PLAN:   1 Day Post-Op s/p: Left transmetatarsal amputation.  I inspected his wound this morning and it looks good.  I have consulted physical therapy so that he can learn bed to chair transfers, partial weightbearing on the left leg, heel only, with Darco shoe.  I have consulted the nutrition team to evaluate his nutritional status.  This will clearly be helpful for healing  Discontinue Foley.  This was placed yesterday because his bladder scan showed 700 cc.   He is on Lovenox for DVT prophylaxis  Hopefully back to skilled nursing facility if he is able to void and once he can safely transfer bed to chair.  SUBJECTIVE:   No complaints this morning  PHYSICAL EXAM:   Vitals:   04/06/18 1943 04/07/18 0018 04/07/18 0325 04/07/18 0400  BP: (!) 147/70 138/66 135/63 131/62  Pulse: 67 65 67 62  Resp: 19 17 19 19   Temp: 97.9 F (36.6 C) 97.7 F (36.5 C) 97.8 F (36.6 C)   TempSrc: Oral Oral Oral   SpO2: 97% 96% 96% 97%  Weight:      Height:       I inspected his left transmetatarsal amputation site and this looks fine.  The wound was redressed.  LABS:   CBG (last 3)  Recent Labs    04/06/18 1606 04/06/18 2101 04/07/18 0633  GLUCAP 235* 290* 141*    PROBLEM LIST:    Active Problems:   PVD (peripheral vascular disease) (HCC)  CURRENT MEDS:   . amLODipine  10 mg Oral Daily  . aspirin EC  81 mg Oral Daily  . atorvastatin  40 mg Oral Q2000  . busPIRone  7.5 mg Oral Daily  . carvedilol  25 mg Oral BID  . divalproex  500 mg Oral BID  . enoxaparin (LOVENOX) injection  40 mg Subcutaneous Q24H  . feeding supplement (PRO-STAT SUGAR FREE 64)  30 mL Oral Daily  . ferrous sulfate  325 mg Oral BID  . fluticasone  2 spray Each Nare Daily  . gabapentin  300 mg Oral TID  . hydrALAZINE  50 mg Oral TID  . insulin glargine  30 Units Subcutaneous QHS  . insulin NPH Human  55 Units Subcutaneous BID AC & HS  . loratadine  10 mg Oral Daily  .  memantine  10 mg Oral BID  . multivitamin with minerals  1 tablet Oral Daily  . oxybutynin  10 mg Oral Daily  . pantoprazole  40 mg Oral Daily  . potassium chloride  20-40 mEq Oral Once  . sertraline  100 mg Oral Daily  . tamsulosin  0.4 mg Oral Q2000  . vitamin C  500 mg Oral BID   [name] Beeper: [phone] Office: [phone] 04/07/2018

## 2018-04-07 NOTE — Progress Notes (Signed)
CSW acknowledges consult for SNF placement . Will follow for therapy recommendations.  Jceon Alverio, LCSWA Clinical Social Worker [phone]  

## 2018-04-07 NOTE — Progress Notes (Signed)
Initial Nutrition Assessment  DOCUMENTATION CODES:   Not applicable  INTERVENTION:   - Continue MVI with minerals daily  - Pro-stat 30 ml BID, each supplement provides 100 kcal and 15 grams of protein  NUTRITION DIAGNOSIS:   Increased nutrient needs related to post-op healing, wound healing as evidenced by estimated needs.  GOAL:   Patient will meet greater than or equal to 90% of their needs  MONITOR:   PO intake, Supplement acceptance, Labs, Skin, Weight trends  REASON FOR ASSESSMENT:   Consult Assessment of nutrition requirement/status  ASSESSMENT:   78 year old male who presented for L transmetatarsal amputation due to gangrene on 1/21. PMH significant for HTN, PVD, bipolar, PAD, T2DM, dementia, depression, CAD. Pt had R BKA in 2016.   Spoke with pt at bedside. Pt in good spiris at time of RD visit and was able to answer most questions appropriately.  Pt shares that he currently has a good appetite and also had a good appetite PTA. Pt shares that he eats 3 meals daily at the facility where he resides.  Breakfast: eggs, bacon, toast, sausage Lunch: sandwich Dinner: "heavy meal" that may include beans, potatoes, and squash  Pt denies any recent weight changes and reports his UBW as 190 lbs. RD obtained bed weight at time of visit: 193 lbs.  Pt denies any nutrition-related concerns at this time and reports he consumed 100% of breakfast and lunch meals. RD encouraged continued adequate PO intake with a focus on high-protein foods to aid in wound healing. Pt expresses understanding.  Pt mentions that the staff at the facility where he resides "don't work me enough." Pt expresses desire to be able to walk and not have to be transported via wheelchair. Pt states that he wants to get stronger.  Meal Completion: 100% x last 3 meals  Medications reviewed and include: Pro-stat 30 ml daily, ferrous sulfate, Lantus 30 units daily, Humulin 55 units BID, MVI with minerals,  Protonix, K-dur IVF: NS @ 50 ml/hr  Labs reviewed: sodium 134 (L) CBG's: 110, 141, 290, 235 x 24 hours  NUTRITION - FOCUSED PHYSICAL EXAM:    Most Recent Value  Orbital Region  Mild depletion  Upper Arm Region  No depletion  Thoracic and Lumbar Region  No depletion  Buccal Region  No depletion  Temple Region  Mild depletion  Clavicle Bone Region  Mild depletion  Clavicle and Acromion Bone Region  Mild depletion  Scapular Bone Region  Unable to assess  Dorsal Hand  No depletion  Patellar Region  Mild depletion  Anterior Thigh Region  Mild depletion  Posterior Calf Region  Moderate depletion  Edema (RD Assessment)  None  Hair  Reviewed  Eyes  Reviewed  Mouth  Reviewed  Skin  Reviewed  Nails  Reviewed       Diet Order:   Diet Order            Diet Carb Modified Fluid consistency: Thin; Room service appropriate? Yes  Diet effective now              EDUCATION NEEDS:   No education needs have been identified at this time  Skin:  Skin Assessment: Skin Integrity Issues: Incisions: closed incision to L foot, closed incision to R leg (BKA in 2016)  Last BM:  1/20  Height:   Ht Readings from Last 1 Encounters:  04/06/18 6\' 1"  (1.854 m)    Weight:   Wt Readings from Last 1 Encounters:  04/06/18 86.6 kg  Ideal Body Weight:  78.2 kg (adjusted for BKA)  BMI:  Body mass index is 25.2 kg/m.  Estimated Nutritional Needs:   Kcal:  1700-1900  Protein:  90-105 grams  Fluid:  >/= 1.7 L    Earma ReadingKate Jablonski Anner Baity, MS, RD, LDN Inpatient Clinical Dietitian Pager: [phone] Weekend/After Hours: [phone]

## 2018-04-07 NOTE — Evaluation (Signed)
Physical Therapy Evaluation Patient Details Name: Terry Green MRN: [id number] DOB: 1940/12/30 Today's Date: 04/07/2018   History of Present Illness  Pt is a 78 y.o. M with significant PMH of right BKA, PAD, PVD, left hip fracture who presents s/p left transmetatarsal amputation.  Clinical Impression  Pt admitted with above diagnosis. Pt from SNF and no family/caregiver present to determine accurate PLOF. On PT evaluation, patient presenting with decreased functional mobility secondary to significant deconditioning and debility. Requiring two person maximal assistance for low pivot transfer from bed to chair towards left with prosthetic donned. Pt unable to utilize upper extremities efficiently due to hand/finger arthritic changes. Recommended use of maximove transfer to nursing staff. Recommend return to SNF. Will follow acutely.    Follow Up Recommendations SNF    Equipment Recommendations  None recommended by PT    Recommendations for Other Services       Precautions / Restrictions Precautions Precautions: Fall Precaution Comments: right BKA, prosthetic in room Required Braces or Orthoses: Other Brace Other Brace: darco shoe Restrictions Weight Bearing Restrictions: Yes LLE Weight Bearing: Partial weight bearing(heel weightbearing only)      Mobility  Bed Mobility Overal bed mobility: Needs Assistance Bed Mobility: Supine to Sit     Supine to sit: Mod assist     General bed mobility comments: ModA provided at trunk for lift assist  Transfers Overall transfer level: Needs assistance Equipment used: None Transfers: Squat Pivot Transfers     Squat pivot transfers: Max assist;+2 physical assistance     General transfer comment: Pt unable to stand with prosthetic donned. Requiring maxA + 2, almost totalA for squat pivot transfer towards left. Minimal functional use of BUE's   Ambulation/Gait                Stairs            Wheelchair Mobility     Modified Rankin (Stroke Patients Only)       Balance Overall balance assessment: Needs assistance Sitting-balance support: Feet supported Sitting balance-Leahy Scale: Fair                                       Pertinent Vitals/Pain Pain Assessment: No/denies pain    Home Living Family/patient expects to be discharged to:: Skilled nursing facility                      Prior Function Level of Independence: Needs assistance   Gait / Transfers Assistance Needed: Pt states "I use a walker."  ADL's / Homemaking Assistance Needed: Requires assist for all ADL's        Hand Dominance        Extremity/Trunk Assessment   Upper Extremity Assessment Upper Extremity Assessment: RUE deficits/detail;LUE deficits/detail RUE Deficits / Details: flexor posturing due to RA changes LUE Deficits / Details: flexor posturing due to RA changes    Lower Extremity Assessment Lower Extremity Assessment: RLE deficits/detail;LLE deficits/detail RLE Deficits / Details: BKA LLE Deficits / Details: s/p transmetarsal amputation       Communication   Communication: No difficulties  Cognition Arousal/Alertness: Awake/alert Behavior During Therapy: WFL for tasks assessed/performed Overall Cognitive Status: History of cognitive impairments - at baseline  General Comments: history of dementia at baseline      General Comments      Exercises     Assessment/Plan    PT Assessment Patient needs continued PT services  PT Problem List Decreased strength;Decreased activity tolerance;Decreased balance;Decreased mobility       PT Treatment Interventions Functional mobility training;Therapeutic activities;Therapeutic exercise;Balance training;Patient/family education;Wheelchair mobility training    PT Goals (Current goals can be found in the Care Plan section)  Acute Rehab PT Goals Patient Stated Goal: "Not use a  walker." PT Goal Formulation: With patient Time For Goal Achievement: 04/21/18 Potential to Achieve Goals: Fair    Frequency Min 2X/week   Barriers to discharge        Co-evaluation               AM-PAC PT "6 Clicks" Mobility  Outcome Measure Help needed turning from your back to your side while in a flat bed without using bedrails?: A Little Help needed moving from lying on your back to sitting on the side of a flat bed without using bedrails?: A Lot Help needed moving to and from a bed to a chair (including a wheelchair)?: A Lot Help needed standing up from a chair using your arms (e.g., wheelchair or bedside chair)?: Total Help needed to walk in hospital room?: Total Help needed climbing 3-5 steps with a railing? : Total 6 Click Score: 10    End of Session Equipment Utilized During Treatment: Gait belt Activity Tolerance: Patient tolerated treatment well Patient left: in chair;with call bell/phone within reach;with chair alarm set Nurse Communication: Mobility status;Need for lift equipment PT Visit Diagnosis: Other abnormalities of gait and mobility (R26.89);Muscle weakness (generalized) (M62.81)    Time: 2409-7353 PT Time Calculation (min) (ACUTE ONLY): 32 min   Charges:   PT Evaluation $PT Eval Moderate Complexity: 1 Mod PT Treatments $Neuromuscular Re-education: 8-22 mins       Laurina Bustle, PT, DPT Acute Rehabilitation Services Pager [phone] Office [phone]   Vanetta Mulders 04/07/2018, 4:19 PM

## 2018-04-07 NOTE — Progress Notes (Signed)
Inpatient Diabetes Program Recommendations  AACE/ADA: New Consensus Statement on Inpatient Glycemic Control (2015)  Target Ranges:  Prepandial:   less than 140 mg/dL      Peak postprandial:   less than 180 mg/dL (1-2 hours)      Critically ill patients:  140 - 180 mg/dL   Lab Results  Component Value Date   GLUCAP 141 (H) 04/07/2018   HGBA1C 11.2 (H) 06/06/2014    Review of Glycemic Control Results for Terry Green, Terry Green (MRN [id number]) as of 04/07/2018 10:32  Ref. Range 04/06/2018 16:06 04/06/2018 21:01 04/07/2018 06:33  Glucose-Capillary Latest Ref Range: 70 - 99 mg/dL 003 (H) 704 (H) 888 (H)   Diabetes history: Type 2 DM Outpatient Diabetes medications: Basaglar 30 units QHS, NPH- 55 units BID Current orders for Inpatient glycemic control: Basaglar 30 units QHS, NPH- 55 units BID  Inpatient Diabetes Program Recommendations:    Noted patient trends increased on 1/21, patient also received Decadron 10 mg x 1 thus explaining increases.   Also, last A1C result was from 2016, consider repeating A1C if appropriate.   Thanks, Lujean Rave, MSN, RNC-OB Diabetes Coordinator [phone] (8a-5p)

## 2018-04-07 NOTE — Progress Notes (Signed)
Orthopedic Tech Progress Note Patient Details:  Terry Green [birth year] [id number]  Ortho Devices Type of Ortho Device: Darco shoe Ortho Device/Splint Location: LLE Ortho Device/Splint Interventions: Adjustment, Application, Ordered   Post Interventions Patient Tolerated: Well Instructions Provided: Care of device, Adjustment of device   Donald Pore 04/07/2018, 9:24 AM

## 2018-04-08 LAB — GLUCOSE, CAPILLARY
GLUCOSE-CAPILLARY: 45 mg/dL — AB (ref 70–99)
GLUCOSE-CAPILLARY: 92 mg/dL (ref 70–99)
Glucose-Capillary: 12 mg/dL — CL (ref 70–99)
Glucose-Capillary: 148 mg/dL — ABNORMAL HIGH (ref 70–99)
Glucose-Capillary: 77 mg/dL (ref 70–99)
Glucose-Capillary: 96 mg/dL (ref 70–99)

## 2018-04-08 MED ORDER — DEXTROSE 50 % IV SOLN
INTRAVENOUS | Status: AC
  Administered 2018-04-08: 50 mL
  Filled 2018-04-08: qty 50

## 2018-04-08 MED ORDER — INSULIN ASPART 100 UNIT/ML ~~LOC~~ SOLN
0.0000 [IU] | SUBCUTANEOUS | Status: DC
  Administered 2018-04-09: 1 [IU] via SUBCUTANEOUS

## 2018-04-08 NOTE — NC FL2 (Signed)
[place] MEDICAID FL2 LEVEL OF CARE SCREENING TOOL     IDENTIFICATION  Patient Name: Terry Green Birthdate: 1940-11-20 Sex: male Admission Date (Current Location): 04/06/2018  [facility] [place]   Facility and Address:  [facility] [place], [address], [place], [place] [address]      Provider Number: [id number]  Attending Physician Name and Address:  Chuck Hintickson, Christopher S, *  Relative Name and Phone Number:  Laurena Slimmereal,Sharon, [phone]    Current Level of Care: Hospital Recommended Level of Care: Skilled Nursing Facility Prior Approval Number:    Date Approved/Denied:   PASRR Number: [id number]  Discharge Plan: SNF    Current Diagnoses: Patient Active Problem List   Diagnosis Date Noted  . PVD (peripheral vascular disease) (HCC) 04/06/2018  . 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 Protein-calorie malnutrition, severe (HCC) 06/08/2014  . PAD (peripheral artery disease) (HCC) 06/06/2014    Orientation RESPIRATION BLADDER Height & Weight     Self, Time, Situation, Place  Normal Continent Weight: 191 lb (86.6 kg) Height:  6\' 1"  (185.4 cm)  BEHAVIORAL SYMPTOMS/MOOD NEUROLOGICAL BOWEL NUTRITION STATUS      Continent Diet(please see discharge summary)  AMBULATORY STATUS COMMUNICATION OF NEEDS Skin     Verbally Other (Comment)(incision(closed)foot; Left)                       Personal Care Assistance Level of Assistance  Bathing, Feeding, Dressing Bathing Assistance: Limited assistance Feeding assistance: Independent Dressing Assistance: Limited assistance     Functional Limitations Info  Sight, Hearing, Speech Sight Info: Adequate Hearing Info: Adequate Speech Info: Adequate    SPECIAL CARE FACTORS FREQUENCY  PT (By licensed PT), OT (By licensed OT)     PT Frequency: 5x per week OT Frequency: 5xper week            Contractures  Contractures Info: Not present    Additional Factors Info  Code Status, Allergies, Insulin Sliding Scale Code Status Info: FULL Allergies Info: Lisinopril   Insulin Sliding Scale Info: insulin aspart (novoLOG) injection 0-9 Units  every 4 hours       Current Medications (04/08/2018):  This is the current hospital active medication list Current Facility-Administered Medications  Medication Dose Route Frequency Provider Last Rate Last Dose  . 0.9 %  sodium chloride infusion   Intravenous Continuous Chuck Hintickson, Christopher S, MD 50 mL/hr at 04/08/18 0500    . acetaminophen (TYLENOL) tablet 325-650 mg  325-650 mg Oral Q4H PRN Chuck Hintickson, Christopher S, MD       Or  . acetaminophen (TYLENOL) suppository 325-650 mg  325-650 mg Rectal Q4H PRN Chuck Hintickson, Christopher S, MD      . alum & mag hydroxide-simeth (MAALOX/MYLANTA) 200-200-20 MG/5ML suspension 15 mL  15 mL Oral Q2H PRN Chuck Hintickson, Christopher S, MD      . alum & mag hydroxide-simeth (MAALOX/MYLANTA) 200-200-20 MG/5ML suspension 15-30 mL  15-30 mL Oral Q2H PRN Chuck Hintickson, Christopher S, MD      . amLODipine (NORVASC) tablet 10 mg  10 mg Oral Daily Chuck Hintickson, Christopher S, MD   10 mg at 04/08/18 1148  . aspirin EC tablet 81 mg  81 mg Oral Daily Chuck Hintickson, Christopher S, MD   81 mg at 04/08/18 1148  . atorvastatin (LIPITOR) tablet 40 mg  40 mg Oral Q2000 Chuck Hintickson, Christopher S, MD   40 mg at 04/07/18 2146  .  busPIRone (BUSPAR) tablet 7.5 mg  7.5 mg Oral Daily Chuck Hint, MD   7.5 mg at 04/08/18 1151  . carvedilol (COREG) tablet 25 mg  25 mg Oral BID Chuck Hint, MD   25 mg at 04/08/18 1147  . divalproex (DEPAKOTE) DR tablet 500 mg  500 mg Oral BID Chuck Hint, MD   500 mg at 04/08/18 1148  . enoxaparin (LOVENOX) injection 40 mg  40 mg Subcutaneous Q24H Chuck Hint, MD   40 mg at 04/08/18 1150  . feeding supplement (PRO-STAT SUGAR FREE 64) liquid 30 mL  30 mL Oral BID BM Chuck Hint, MD   30 mL at 04/08/18  1150  . ferrous sulfate tablet 325 mg  325 mg Oral BID Chuck Hint, MD   325 mg at 04/08/18 1149  . fluticasone (FLONASE) 50 MCG/ACT nasal spray 2 spray  2 spray Each Nare Daily Chuck Hint, MD   2 spray at 04/08/18 1150  . gabapentin (NEURONTIN) capsule 300 mg  300 mg Oral TID Chuck Hint, MD   300 mg at 04/08/18 1148  . guaiFENesin-dextromethorphan (ROBITUSSIN DM) 100-10 MG/5ML syrup 15 mL  15 mL Oral Q4H PRN Chuck Hint, MD      . hydrALAZINE (APRESOLINE) injection 5 mg  5 mg Intravenous Q20 Min PRN Chuck Hint, MD      . hydrALAZINE (APRESOLINE) tablet 50 mg  50 mg Oral TID Chuck Hint, MD   50 mg at 04/08/18 1149  . HYDROcodone-acetaminophen (NORCO/VICODIN) 5-325 MG per tablet 1 tablet  1 tablet Oral Q6H PRN Chuck Hint, MD      . HYDROmorphone (DILAUDID) injection 0.5-1 mg  0.5-1 mg Intravenous Q2H PRN Chuck Hint, MD      . insulin aspart (novoLOG) injection 0-9 Units  0-9 Units Subcutaneous Q4H Emilie Rutter, PA-C      . insulin glargine (LANTUS) injection 30 Units  30 Units Subcutaneous QHS Chuck Hint, MD   30 Units at 04/07/18 2146  . ipratropium-albuterol (DUONEB) 0.5-2.5 (3) MG/3ML nebulizer solution 3 mL  3 mL Nebulization Q6H PRN Chuck Hint, MD      . labetalol (NORMODYNE,TRANDATE) injection 10 mg  10 mg Intravenous Q10 min PRN Chuck Hint, MD      . lactated ringers infusion   Intravenous Continuous Chuck Hint, MD      . loratadine (CLARITIN) tablet 10 mg  10 mg Oral Daily Chuck Hint, MD   10 mg at 04/08/18 1147  . memantine (NAMENDA) tablet 10 mg  10 mg Oral BID Chuck Hint, MD   10 mg at 04/08/18 1147  . metoprolol tartrate (LOPRESSOR) injection 2-5 mg  2-5 mg Intravenous Q2H PRN Chuck Hint, MD      . multivitamin with minerals tablet 1 tablet  1 tablet Oral Daily Chuck Hint, MD   1 tablet at 04/08/18  1148  . ondansetron (ZOFRAN) injection 4 mg  4 mg Intravenous Q6H PRN Chuck Hint, MD      . oxybutynin (DITROPAN) tablet 10 mg  10 mg Oral Daily Chuck Hint, MD   10 mg at 04/08/18 1147  . oxyCODONE-acetaminophen (PERCOCET/ROXICET) 5-325 MG per tablet 1-2 tablet  1-2 tablet Oral Q4H PRN Chuck Hint, MD      . pantoprazole (PROTONIX) EC tablet 40 mg  40 mg Oral Daily Chuck Hint, MD   40 mg at 04/08/18 1149  .  phenol (CHLORASEPTIC) mouth spray 1 spray  1 spray Mouth/Throat PRN Chuck Hint, MD      . polyethylene glycol powder (GLYCOLAX/MIRALAX) container 17 g  17 g Oral Daily PRN Chuck Hint, MD      . potassium chloride SA (K-DUR,KLOR-CON) CR tablet 20-40 mEq  20-40 mEq Oral Once Chuck Hint, MD      . sertraline (ZOLOFT) tablet 100 mg  100 mg Oral Daily Chuck Hint, MD   100 mg at 04/08/18 1148  . tamsulosin (FLOMAX) capsule 0.4 mg  0.4 mg Oral Q2000 Chuck Hint, MD   0.4 mg at 04/07/18 2100  . vitamin C (ASCORBIC ACID) tablet 500 mg  500 mg Oral BID Chuck Hint, MD   500 mg at 04/08/18 1147     Discharge Medications: Please see discharge summary for a list of discharge medications.  Relevant Imaging Results:  Relevant Lab Results:   Additional Information SSN [id number]  Eduard Roux, [place]

## 2018-04-08 NOTE — Progress Notes (Addendum)
Results for EVIE, WILTSIE (MRN [id number]) as of 04/08/2018 08:51  Ref. Range 04/07/2018 11:13 04/07/2018 16:28 04/07/2018 20:57 04/08/2018 07:03 04/08/2018 07:19  Glucose-Capillary Latest Ref Range: 70 - 99 mg/dL 929 (H) 84 84 12 (LL) 244 (H)  Received diabetes coordinator consult. Patient had a CBG of 12 mg/dl this am. Noted that patient is on NPH 55 units BID and Lantus 30 units every HS.    Recommend discontinuing the NPH 55 units BID.  Continue Lantus 30 units every HS and add Novolog SENSITIVE correction scale (0-9 units) every 4 hours to see what his insulin needs are. Then consider changing correction scale to TID & HS on 1/24.  Will continue to monitor blood sugars while in the hospital.  Smith Mince RN BSN CDE Diabetes Coordinator Pager: [phone]  8am-5pm

## 2018-04-08 NOTE — Progress Notes (Addendum)
   VASCULAR SURGERY ASSESSMENT & PLAN:   2 Days Post-Op s/p: Left transmetatarsal amputation.  This is healing well.  GU: His Foley catheter was removed yesterday.  He has been able to void.  DIABETES: His blood sugar was low this morning. The Diabetic coordinator is following.   PHYSICAL THERAPY: He can go back to the skilled nursing facility once he is able to safely transfer bed to chair with his Darco shoe (partial weightbearing left lower extremity, heel only)  DVT PROPHYLAXIS: He is on Lovenox  SUBJECTIVE:   Comfortable.  PHYSICAL EXAM:   Vitals:   04/07/18 1701 04/07/18 1930 04/07/18 2357 04/08/18 0318  BP: 137/63 129/71 (!) 143/64 (!) 139/97  Pulse:  (!) 58 (!) 59 (!) 57  Resp:  19 17 20   Temp: 98 F (36.7 C) 97.6 F (36.4 C) 98.1 F (36.7 C) 98.4 F (36.9 C)  TempSrc: Oral Oral Oral Oral  SpO2: 97% 93% 94% 93%  Weight:      Height:       I inspected his left transmetatarsal amputation site and this is looking good. This was redressed.  LABS:    CBG (last 3)  Recent Labs    04/07/18 2057 04/08/18 0703 04/08/18 0719  GLUCAP 84 12* 148*    PROBLEM LIST:    Active Problems:   PVD (peripheral vascular disease) (HCC)   CURRENT MEDS:   . amLODipine  10 mg Oral Daily  . aspirin EC  81 mg Oral Daily  . atorvastatin  40 mg Oral Q2000  . busPIRone  7.5 mg Oral Daily  . carvedilol  25 mg Oral BID  . divalproex  500 mg Oral BID  . enoxaparin (LOVENOX) injection  40 mg Subcutaneous Q24H  . feeding supplement (PRO-STAT SUGAR FREE 64)  30 mL Oral BID BM  . ferrous sulfate  325 mg Oral BID  . fluticasone  2 spray Each Nare Daily  . gabapentin  300 mg Oral TID  . hydrALAZINE  50 mg Oral TID  . insulin glargine  30 Units Subcutaneous QHS  . insulin NPH Human  55 Units Subcutaneous BID AC & HS  . loratadine  10 mg Oral Daily  . memantine  10 mg Oral BID  . multivitamin with minerals  1 tablet Oral Daily  . oxybutynin  10 mg Oral Daily  . pantoprazole   40 mg Oral Daily  . potassium chloride  20-40 mEq Oral Once  . sertraline  100 mg Oral Daily  . tamsulosin  0.4 mg Oral Q2000  . vitamin C  500 mg Oral BID    Terry Green Beeper: [phone] Office: [phone] 04/08/2018

## 2018-04-08 NOTE — Consult Note (Signed)
            [facility] CM Primary Care Navigator  04/08/2018  Terry Green 1940-06-29 [id number]   Went to see patient at the bedside to identify possible discharge needs and RN is in the room providing patient care.  Per MD note, patient was admitted for left foot gangrene (status post left transmetatarsal amputation).  According toRN, anticipatingpatient toreturn back to [facility] skilled nursing facility. Patient reports being a resident of [facility] for a year and a facility physician (Dr. Rudene Re) follows him up in the facility as his primary care provider.  No furtherTHN care management needs identifiable atthis point.  Patient was encouraged to follow-up with facility physician/ primary care provider to further manage his health needs after discharge.    For additional questions please contact:  Karin Golden A. Lis Savitt, BSN, RN-BC [facility] PRIMARY CARE Navigator Cell: [phone]

## 2018-04-08 NOTE — Progress Notes (Signed)
Pt's blood sugar of 12, pt confused given 1 amp of d50 given breakfast tray rechecked blood sugar 148 pt now a& O x 4 will continue to monitor

## 2018-04-08 NOTE — Progress Notes (Addendum)
Hypoglycemic Event  CBG: 45  Treatment: 8 oz juice/soda apple sauce and 50% lunch  Symptoms: None  Follow-up CBG: Time:1214 CBG Result:77  Possible Reasons for Event: Medication regimen: nph  Comments/MD notified:matt eveland    Thomas Hoff

## 2018-04-09 DIAGNOSIS — I70262 Atherosclerosis of native arteries of extremities with gangrene, left leg: Secondary | ICD-10-CM | POA: Diagnosis not present

## 2018-04-09 DIAGNOSIS — E785 Hyperlipidemia, unspecified: Secondary | ICD-10-CM | POA: Diagnosis not present

## 2018-04-09 DIAGNOSIS — F068 Other specified mental disorders due to known physiological condition: Secondary | ICD-10-CM | POA: Diagnosis not present

## 2018-04-09 DIAGNOSIS — F411 Generalized anxiety disorder: Secondary | ICD-10-CM | POA: Diagnosis not present

## 2018-04-09 DIAGNOSIS — F319 Bipolar disorder, unspecified: Secondary | ICD-10-CM | POA: Diagnosis not present

## 2018-04-09 DIAGNOSIS — I10 Essential (primary) hypertension: Secondary | ICD-10-CM | POA: Diagnosis not present

## 2018-04-09 DIAGNOSIS — M9702XD Periprosthetic fracture around internal prosthetic left hip joint, subsequent encounter: Secondary | ICD-10-CM | POA: Diagnosis not present

## 2018-04-09 DIAGNOSIS — M1990 Unspecified osteoarthritis, unspecified site: Secondary | ICD-10-CM | POA: Diagnosis not present

## 2018-04-09 DIAGNOSIS — Z743 Need for continuous supervision: Secondary | ICD-10-CM | POA: Diagnosis not present

## 2018-04-09 DIAGNOSIS — S98112A Complete traumatic amputation of left great toe, initial encounter: Secondary | ICD-10-CM | POA: Diagnosis not present

## 2018-04-09 DIAGNOSIS — Z89511 Acquired absence of right leg below knee: Secondary | ICD-10-CM | POA: Diagnosis not present

## 2018-04-09 DIAGNOSIS — M6281 Muscle weakness (generalized): Secondary | ICD-10-CM | POA: Diagnosis not present

## 2018-04-09 DIAGNOSIS — R262 Difficulty in walking, not elsewhere classified: Secondary | ICD-10-CM | POA: Diagnosis not present

## 2018-04-09 DIAGNOSIS — M069 Rheumatoid arthritis, unspecified: Secondary | ICD-10-CM | POA: Diagnosis not present

## 2018-04-09 DIAGNOSIS — R351 Nocturia: Secondary | ICD-10-CM | POA: Diagnosis not present

## 2018-04-09 DIAGNOSIS — Z96642 Presence of left artificial hip joint: Secondary | ICD-10-CM | POA: Diagnosis not present

## 2018-04-09 DIAGNOSIS — N400 Enlarged prostate without lower urinary tract symptoms: Secondary | ICD-10-CM | POA: Diagnosis not present

## 2018-04-09 DIAGNOSIS — F0390 Unspecified dementia without behavioral disturbance: Secondary | ICD-10-CM | POA: Diagnosis not present

## 2018-04-09 DIAGNOSIS — E1142 Type 2 diabetes mellitus with diabetic polyneuropathy: Secondary | ICD-10-CM | POA: Diagnosis not present

## 2018-04-09 DIAGNOSIS — Z89422 Acquired absence of other left toe(s): Secondary | ICD-10-CM | POA: Diagnosis not present

## 2018-04-09 DIAGNOSIS — Z4781 Encounter for orthopedic aftercare following surgical amputation: Secondary | ICD-10-CM | POA: Diagnosis not present

## 2018-04-09 DIAGNOSIS — D649 Anemia, unspecified: Secondary | ICD-10-CM | POA: Diagnosis not present

## 2018-04-09 DIAGNOSIS — F339 Major depressive disorder, recurrent, unspecified: Secondary | ICD-10-CM | POA: Diagnosis not present

## 2018-04-09 DIAGNOSIS — I739 Peripheral vascular disease, unspecified: Secondary | ICD-10-CM | POA: Diagnosis not present

## 2018-04-09 DIAGNOSIS — R279 Unspecified lack of coordination: Secondary | ICD-10-CM | POA: Diagnosis not present

## 2018-04-09 LAB — GLUCOSE, CAPILLARY
Glucose-Capillary: 117 mg/dL — ABNORMAL HIGH (ref 70–99)
Glucose-Capillary: 138 mg/dL — ABNORMAL HIGH (ref 70–99)
Glucose-Capillary: 86 mg/dL (ref 70–99)
Glucose-Capillary: 98 mg/dL (ref 70–99)

## 2018-04-09 MED ORDER — HYDROCODONE-ACETAMINOPHEN 5-325 MG PO TABS: 1.0000 | ORAL_TABLET | ORAL | 0 refills | Status: AC | PRN

## 2018-04-09 NOTE — Progress Notes (Signed)
Report given to receiving nurse at [facility], Norlene Duel, LPN. All questions answered. Pt's IV and telemetry box removed. AVS and other d/c paperwork to be given to [facility] upon arrival. Pt's belongings packed at bedside, including clothing and right leg prosthetic. Pt awaiting PTAR arrival.   Ardeen Jourdain BSN, RN

## 2018-04-09 NOTE — Clinical Social Work Note (Signed)
Clinical Social Work Assessment  Patient Details  Name: Terry Green MRN: [id number] Date of Birth: 15-Jan-1941  Date of referral:  04/08/18               Reason for consult:  Facility Placement                Permission sought to share information with:  Facility Medical sales representative, Family Supports Permission granted to share information::  Yes, Verbal Permission Granted  Name::     [name]   Agency::  [facility]  Relationship::  daughter  Contact Information:  [phone]  Housing/Transportation Living arrangements for the past 2 months:  Skilled Nursing Facility Source of Information:  Patient Patient Interpreter Needed:  None Criminal Activity/Legal Involvement Pertinent to Current Situation/Hospitalization:  No - Comment as needed Significant Relationships:  Adult Children Lives with:  Facility Resident Do you feel safe going back to the place where you live?  Yes Need for family participation in patient care:  Yes (Comment)  Care giving concerns:  CSW received consult for discharge needs. CSW spoke with patient regarding PT recommendation of SNF placement at time of discharge.     Social Worker assessment / plan:  CSW spoke with patient concerning possibility of rehab at [facility] before returning home. Patient was alert and oriented. Patient states he is from [facility] and has been a resident there for over a year.    Employment status:    Insurance information:  Medicare PT Recommendations:  Skilled Nursing Facility Information / Referral to community resources:  Skilled Nursing Facility  Patient/Family's Response to care:  Patient recognizes need for rehab before returning home and is agreeable to return to the [facility] for rehab as recommended.    Patient/Family's Understanding of and Emotional Response to Diagnosis, Current Treatment, and Prognosis: Patient is realistic regarding therapy needs and expressed being hopeful for SNF placement. Patient  was pleasant and appeared happy about going back home. Patient expressed understanding of CSW role and discharge process as well as medical condition. No questions/concerns about plan or treatment at this time.   Emotional Assessment Appearance:  Appears stated age Attitude/Demeanor/Rapport:  Engaged Affect (typically observed):  Accepting, Appropriate, Pleasant Orientation:  Oriented to Self, Oriented to Place, Oriented to  Time, Oriented to Situation Alcohol / Substance use:  Not Applicable Psych involvement (Current and /or in the community):  No (Comment)  Discharge Needs  Concerns to be addressed:  Care Coordination Readmission within the last 30 days:  No Current discharge risk:  Dependent with Mobility Barriers to Discharge:  Continued Medical Work up   [employer], LCSWA 04/09/2018, 12:50 PM

## 2018-04-09 NOTE — Discharge Summary (Signed)
[facility] Discharge Summary   Patient ID:  Terry Green MRN: [id number] DOB/AGE: 1940/09/15 78 y.o.  Admit date: 04/06/2018 Discharge date: 04/09/2018 Date of Surgery: 04/06/2018 Surgeon: Surgeon(s): Chuck Hint, MD  Admission Diagnosis: GANGRENE LEFT FOOT  Discharge Diagnoses:  GANGRENE LEFT FOOT  Secondary Diagnoses: Past Medical History:  Diagnosis Date  . Abnormality of gait and mobility   . Anemia    iron deficiency  . Anxiety   . Arthritis    "hands" (06/06/2014)  . Bipolar disorder (HCC)   . BPH (benign prostatic hyperplasia)   . Coronary artery disease   . Dementia (HCC)   . Depression   . 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 Generalized weakness   . Hypercholesteremia   . Hypertension   . PAD (peripheral artery disease) (HCC)   . Peripheral vascular disease (HCC)   . Psychosis (HCC)   . Rheumatoid arthritis (HCC)   . Type II diabetes mellitus (HCC)    Type 2    Procedure(s): TRANSMETATARSAL AMPUTATION  Discharged Condition: stable  HPI: Terry Green is a pleasant 78 y.o. male who I last saw on 02/24/2018.  Patient has had amputation of left great toe and the left third toe.  These have been slow to improve.  He has undergone arteriography which showed no further options for revascularization.  He has no significant disease down to the popliteal artery which is then occluded below the knee.  The popliteal to peroneal artery bypass graft is patent with single-vessel runoff via the peroneal artery which stops at the ankle.  It was felt that the toe sites would not heal and he was scheduled for transmetatarsal amputation.   Hospital Course:  Terry Green is a 78 y.o. male is S/P Left Procedure(s): TRANSMETATARSAL AMPUTATION The incision site is healing well.  He is heel weight bearing in a black darco shoe.  Plan follow up in 3 weeks with Dr. Edilia Bo. Resume PT/Rehab as tolerates.  Shower daily, dry  dressing to left foot.    Significant Diagnostic Studies: CBC Lab Results  Component Value Date   WBC 8.3 04/06/2018   HGB 11.7 (L) 04/06/2018   HCT 37.6 (L) 04/06/2018   MCV 74.9 (L) 04/06/2018   PLT 222 04/06/2018    BMET    Component Value Date/Time   NA 134 (L) 04/06/2018 1212   NA 137 09/20/2012 1456   K 4.6 04/06/2018 1212   K 4.1 09/20/2012 1456   CL 100 04/06/2018 1212   CL 100 09/20/2012 1456   CO2 24 04/06/2018 1212   CO2 34 (H) 09/20/2012 1456   GLUCOSE 117 (H) 04/06/2018 1212   GLUCOSE 217 (H) 09/20/2012 1456   BUN 21 04/06/2018 1212   BUN 23 (H) 09/20/2012 1456   CREATININE 1.09 04/06/2018 1212   CREATININE 0.94 09/20/2012 1456   CALCIUM 8.5 (L) 04/06/2018 1212   CALCIUM 9.5 09/20/2012 1456   GFRNONAA >60 04/06/2018 1212   GFRNONAA >60 09/20/2012 1456   GFRAA >60 04/06/2018 1212   GFRAA >60 09/20/2012 1456   COAG Lab Results  Component Value Date   INR 1.17 04/06/2018   INR 1.08 08/19/2016     Disposition:  Discharge to :Skilled nursing facility Discharge Instructions    Call MD for:  redness, tenderness, or signs of infection (pain, swelling, bleeding, redness, odor or green/yellow discharge around incision site)   Complete by:  As directed    Call MD for:  severe or  increased pain, loss or decreased feeling  in affected limb(s)   Complete by:  As directed    Call MD for:  temperature >100.5   Complete by:  As directed    Discharge instructions   Complete by:  As directed    Heel weight bearing in darco shoe, elevation when at rest, dry dressing to incision.  May shower daily between dressing changes.   Resume previous diet   Complete by:  As directed      Allergies as of 04/09/2018      Reactions   Lisinopril Swelling   SWELLING REACTION UNSPECIFIED       Medication List    TAKE these medications   acetaminophen 325 MG tablet Commonly known as:  TYLENOL Take 650 mg by mouth every 4 (four) hours as needed for moderate pain or  fever.   alum & mag hydroxide-simeth 400-400-40 MG/5ML suspension Commonly known as:  MAALOX PLUS Take 15 mLs by mouth every 2 (two) hours as needed for indigestion. Max 6 doses per 24 hours   amLODipine 10 MG tablet Commonly known as:  NORVASC Take 10 mg by mouth daily. (0900)   aspirin EC 81 MG tablet Take 81 mg by mouth daily. (0900)   atorvastatin 40 MG tablet Commonly known as:  LIPITOR Take 40 mg by mouth daily at 8 pm.   BASAGLAR KWIKPEN 100 UNIT/ML Sopn Inject 30 Units into the skin at bedtime. (2000)   busPIRone 7.5 MG tablet Commonly known as:  BUSPAR Take 7.5 mg by mouth daily.   carvedilol 25 MG tablet Commonly known as:  COREG Take 25 mg by mouth 2 (two) times daily. (0900 & 2000)   divalproex 250 MG DR tablet Commonly known as:  DEPAKOTE Take 500 mg by mouth 2 (two) times daily. (0900 & 2000)   ferrous sulfate 325 (65 FE) MG tablet Take 325 mg by mouth 2 (two) times daily.   fluticasone 50 MCG/ACT nasal spray Commonly known as:  FLONASE Place 2 sprays into both nostrils daily.   gabapentin 300 MG capsule Commonly known as:  NEURONTIN Take 300 mg by mouth 3 (three) times daily. (0900, 1600, & 2000)   hydrALAZINE 50 MG tablet Commonly known as:  APRESOLINE Take 50 mg by mouth 3 (three) times daily. (0800, 1600, & 2000)   HYDROcodone-acetaminophen 5-325 MG tablet Commonly known as:  NORCO/VICODIN Take 1 tablet by mouth every 4 (four) hours as needed (for pain.). What changed:  when to take this   ipratropium-albuterol 0.5-2.5 (3) MG/3ML Soln Commonly known as:  DUONEB Take 3 mLs by nebulization See admin instructions. Inhale 3 ml 4 times daily for 7 days then inhale 3 mls every 6 hours as needed for shortness of breath   loratadine 10 MG tablet Commonly known as:  CLARITIN Take 10 mg by mouth daily. (0900)   memantine 10 MG tablet Commonly known as:  NAMENDA Take 10 mg by mouth 2 (two) times daily. (0900 & 2000)   multivitamin with minerals  tablet Take 1 tablet by mouth daily. (0900)   NOVOLIN N 100 UNIT/ML injection Generic drug:  insulin NPH Human Inject 55 Units into the skin 2 (two) times daily.   oxybutynin 5 MG tablet Commonly known as:  DITROPAN Take 10 mg by mouth daily.   polyethylene glycol powder powder Commonly known as:  GLYCOLAX/MIRALAX Take 17 g by mouth daily as needed (constipation).   PROMOD Liqd Take 30 mLs by mouth daily.   ROBITUSSIN MUCUS+CHEST  CONGEST 100 MG/5ML liquid Generic drug:  guaiFENesin Take 200 mg by mouth every 4 (four) hours as needed for cough.   sertraline 100 MG tablet Commonly known as:  ZOLOFT Take 100 mg by mouth daily. (0900)   tamsulosin 0.4 MG Caps capsule Commonly known as:  FLOMAX Take 0.4 mg by mouth daily at 8 pm. (2000)   vitamin C 500 MG tablet Commonly known as:  ASCORBIC ACID Take 500 mg by mouth 2 (two) times daily.      Verbal and written Discharge instructions given to the patient. Wound care per Discharge AVS Follow-up Information    Chuck Hintickson, Christopher S, MD Follow up in 3 week(s).   Specialties:  Vascular Surgery, Cardiology Why:  office will call Contact information: [address] [place] [address] [phone]           Signed: Mosetta Pigeonmma Maureen Majorie Santee 04/09/2018, 12:01 PM

## 2018-04-09 NOTE — Progress Notes (Signed)
[facility] DC Date: 04/09/2018 Family Notified: left voice message with sister and daughter Transport By: Sharin MonsPTAR  RN, patient, and facility notified of DC. Discharge Summary sent to facility. RN given number for report [phone]) [phone]. DC packet on chart. Ambulance transport requested for patient.   Clinical Social Worker signing off. Antony Blackbirdynthia Chantry Headen, [facility] LLCCSWA Clinical Social Worker [phone]

## 2018-04-09 NOTE — Progress Notes (Addendum)
[facility] [place]  Subjective  - Doing well over all, pain controlled in left foot.  Objective 137/64 (!) 55 98 F (36.7 C) (Oral) 16 100%  Intake/Output Summary (Last 24 hours) at 04/09/2018 0744 Last data filed at 04/09/2018 0359 Gross per 24 hour  Intake 806 ml  Output 2500 ml  Net -1694 ml    Left transmet incision healing well closed with interrupted vicryl sutures. Heart RRR Lungs non labored breathing  Assessment/Planning: POD # 3 Left transmetatarsal amputation  Heel weight bearing left right LE amputation.  Working on safe transfers.  Plan to return to SNF  Mosetta Pigeonmma Maureen Collins 04/09/2018 7:44 AM -- I have interviewed the patient and examined the patient. I agree with the findings by the PA. His transmetatarsal amputation site looks fine. Okay for transfer back to skilled nursing facility today from my standpoint.  Cari Carawayhris Kaytlan Behrman, MD [phone]   Laboratory Lab Results: Recent Labs    04/06/18 0753 04/06/18 1212  WBC 7.9 8.3  HGB 12.3* 11.7*  HCT 40.5 37.6*  PLT 244 222   BMET Recent Labs    04/06/18 0753 04/06/18 1212  NA 133* 134*  K 4.2 4.6  CL 101 100  CO2 22 24  GLUCOSE 208* 117*  BUN 21 21  CREATININE 1.11 1.09  CALCIUM 9.0 8.5*    COAG Lab Results  Component Value Date   INR 1.17 04/06/2018   INR 1.08 08/19/2016   No results found for: PTT

## 2018-04-09 NOTE — Progress Notes (Signed)
Results for CONALL, HUFFMAN (MRN [id number]) as of 04/09/2018 09:31  Ref. Range 04/08/2018 16:15 04/08/2018 20:25 04/09/2018 00:04 04/09/2018 03:56 04/09/2018 08:31  Glucose-Capillary Latest Ref Range: 70 - 99 mg/dL 96 92 98 86 962 (H)  Noted that patient's blood sugars are better. Recommend changing Novolog SENSITIVE correction scale to TID & HS scale if patient is eating. Will continue to monitor blood sugars while in the hospital.  Smith Mince RN BSN CDE Diabetes Coordinator Pager: [phone]  8am-5pm

## 2018-04-09 NOTE — Care Management Important Message (Signed)
Important Message  Patient Details  Name: Terry Green MRN: [id number] Date of Birth: 04/07/1940   Medicare Important Message Given:  Yes    Oralia Rud Heydy Montilla 04/09/2018, 11:17 AM

## 2018-04-12 DIAGNOSIS — N400 Enlarged prostate without lower urinary tract symptoms: Secondary | ICD-10-CM | POA: Diagnosis not present

## 2018-04-12 DIAGNOSIS — S98112A Complete traumatic amputation of left great toe, initial encounter: Secondary | ICD-10-CM | POA: Diagnosis not present

## 2018-04-12 DIAGNOSIS — M069 Rheumatoid arthritis, unspecified: Secondary | ICD-10-CM | POA: Diagnosis not present

## 2018-04-12 DIAGNOSIS — I10 Essential (primary) hypertension: Secondary | ICD-10-CM | POA: Diagnosis not present

## 2018-04-20 ENCOUNTER — Other Ambulatory Visit: Payer: Self-pay | Admitting: *Deleted

## 2018-04-20 NOTE — Patient Outreach (Signed)
[facility]) Care Management  04/20/2018  Terry Green [birth year] [id number]  Per Epic Chart review patient is a LTC resident of facility.  No [facility] Care management needs assessed at this time.  Plan to sign off. Terry Green. Terry Ghee, MSN, RN, [facility], CCM  Post Acute Chartered loss adjuster [phone]) Business Cell  [phone]) Toll Free Office

## 2018-04-21 DIAGNOSIS — R05 Cough: Secondary | ICD-10-CM | POA: Diagnosis not present

## 2018-04-21 DIAGNOSIS — S98112A Complete traumatic amputation of left great toe, initial encounter: Secondary | ICD-10-CM | POA: Diagnosis not present

## 2018-04-21 DIAGNOSIS — T8149XA Infection following a procedure, other surgical site, initial encounter: Secondary | ICD-10-CM | POA: Diagnosis not present

## 2018-04-21 DIAGNOSIS — I509 Heart failure, unspecified: Secondary | ICD-10-CM | POA: Diagnosis not present

## 2018-04-21 DIAGNOSIS — R062 Wheezing: Secondary | ICD-10-CM | POA: Diagnosis not present

## 2018-04-23 ENCOUNTER — Other Ambulatory Visit: Payer: Self-pay

## 2018-04-23 ENCOUNTER — Encounter: Payer: Self-pay | Admitting: Vascular Surgery

## 2018-04-23 ENCOUNTER — Ambulatory Visit (INDEPENDENT_AMBULATORY_CARE_PROVIDER_SITE_OTHER): Payer: Self-pay | Admitting: Vascular Surgery

## 2018-04-23 VITALS — BP 126/61 | HR 59 | Temp 97.5°F | Resp 20 | Ht 73.0 in | Wt 191.0 lb

## 2018-04-23 DIAGNOSIS — I70262 Atherosclerosis of native arteries of extremities with gangrene, left leg: Secondary | ICD-10-CM

## 2018-04-23 NOTE — Progress Notes (Signed)
    Subjective:     Patient ID: Terry Green, male   DOB: 26-Aug-1940, 78 y.o.   MRN: [id number]  HPI 78-year male follows up after left-sided transmetatarsal amputation.  He denies any fevers or issues.  Previously has a peroneal artery bypass.  Not having any pain in his foot.  There is concern from the wound nurse that it is not healing.    Review of Systems No issues    Objective:   Physical Exam Awake alert oriented Peroneal artery signal at the left ankle can be traced to the dorsalis pedis on the foot distally Left TMA incision appears to be healing well with some serous drainage       Assessment:     78 year old male male presents for evaluation recent transmetatarsal amputation with a previous peroneal artery bypass that is patent.  There is some serous drainage on the lateral aspect of the wound but overall appears to be healing okay at this time.    Plan:     Follow-up with Dr. Edilia Bo regularly scheduled follow-up Dry dressing daily.     Brandon C. Randie Heinz, MD [facility] [place] Office: [phone] Pager: [phone]

## 2018-04-27 DIAGNOSIS — I509 Heart failure, unspecified: Secondary | ICD-10-CM | POA: Diagnosis not present

## 2018-04-28 DIAGNOSIS — F0150 Vascular dementia without behavioral disturbance: Secondary | ICD-10-CM | POA: Diagnosis not present

## 2018-04-28 DIAGNOSIS — F333 Major depressive disorder, recurrent, severe with psychotic symptoms: Secondary | ICD-10-CM | POA: Diagnosis not present

## 2018-04-28 DIAGNOSIS — F411 Generalized anxiety disorder: Secondary | ICD-10-CM | POA: Diagnosis not present

## 2018-05-04 DIAGNOSIS — Z89432 Acquired absence of left foot: Secondary | ICD-10-CM | POA: Diagnosis not present

## 2018-05-05 ENCOUNTER — Encounter: Payer: Self-pay | Admitting: Vascular Surgery

## 2018-05-05 ENCOUNTER — Ambulatory Visit (INDEPENDENT_AMBULATORY_CARE_PROVIDER_SITE_OTHER): Payer: Self-pay | Admitting: Vascular Surgery

## 2018-05-05 ENCOUNTER — Other Ambulatory Visit: Payer: Self-pay

## 2018-05-05 VITALS — BP 126/61 | HR 58 | Temp 97.8°F | Resp 18 | Ht 73.0 in | Wt 191.0 lb

## 2018-05-05 DIAGNOSIS — F0150 Vascular dementia without behavioral disturbance: Secondary | ICD-10-CM | POA: Diagnosis not present

## 2018-05-05 DIAGNOSIS — F333 Major depressive disorder, recurrent, severe with psychotic symptoms: Secondary | ICD-10-CM | POA: Diagnosis not present

## 2018-05-05 DIAGNOSIS — I70262 Atherosclerosis of native arteries of extremities with gangrene, left leg: Secondary | ICD-10-CM

## 2018-05-05 DIAGNOSIS — F411 Generalized anxiety disorder: Secondary | ICD-10-CM | POA: Diagnosis not present

## 2018-05-05 NOTE — Progress Notes (Signed)
Patient name: Terry Green MRN: [id number] DOB: 1940-11-12 Sex: male  REASON FOR VISIT:   Follow-up of left transmetatarsal amputation.  HPI:   Terry Green is a pleasant 78 y.o. male who had undergone previous first and third toe amputations on the left foot.  He has had a previous popliteal to peroneal artery bypass graft which is patent.  His only runoff to the left foot is the peroneal artery.  He had nonhealing wound on the left foot and ultimately underwent a transmetatarsal amputation on 04/06/2018.  He comes in for a follow-up visit.  Since he was seen last he denies fever.  He has some discomfort in his amputation site.  It is been a month now so I will remove his staples today.  Current Outpatient Medications  Medication Sig Dispense Refill  . acetaminophen (TYLENOL) 325 MG tablet Take 650 mg by mouth every 4 (four) hours as needed for moderate pain or fever.    Marland Kitchen alum & mag hydroxide-simeth (MAALOX PLUS) 400-400-40 MG/5ML suspension Take 15 mLs by mouth every 2 (two) hours as needed for indigestion. Max 6 doses per 24 hours    . amLODipine (NORVASC) 10 MG tablet Take 10 mg by mouth daily. (0900)    . aspirin EC 81 MG tablet Take 81 mg by mouth daily. (0900)    . atorvastatin (LIPITOR) 40 MG tablet Take 40 mg by mouth daily at 8 pm.    . busPIRone (BUSPAR) 7.5 MG tablet Take 7.5 mg by mouth daily.    . carvedilol (COREG) 25 MG tablet Take 25 mg by mouth 2 (two) times daily. (0900 & 2000)    . divalproex (DEPAKOTE) 250 MG DR tablet Take 500 mg by mouth 2 (two) times daily. (0900 & 2000)    . ferrous sulfate 325 (65 FE) MG tablet Take 325 mg by mouth 2 (two) times daily.     . fluticasone (FLONASE) 50 MCG/ACT nasal spray Place 2 sprays into both nostrils daily.     Marland Kitchen gabapentin (NEURONTIN) 300 MG capsule Take 300 mg by mouth 3 (three) times daily. (0900, 1600, & 2000)    . guaiFENesin (ROBITUSSIN MUCUS+CHEST CONGEST) 100 MG/5ML liquid Take 200 mg by mouth every 4 (four) hours  as needed for cough.    . hydrALAZINE (APRESOLINE) 50 MG tablet Take 50 mg by mouth 3 (three) times daily. (0800, 1600, & 2000)    . HYDROcodone-acetaminophen (NORCO/VICODIN) 5-325 MG tablet Take 1 tablet by mouth every 4 (four) hours as needed (for pain.). 30 tablet 0  . Insulin Glargine (BASAGLAR KWIKPEN) 100 UNIT/ML SOPN Inject 30 Units into the skin at bedtime. (2000)     . ipratropium-albuterol (DUONEB) 0.5-2.5 (3) MG/3ML SOLN Take 3 mLs by nebulization See admin instructions. Inhale 3 ml 4 times daily for 7 days then inhale 3 mls every 6 hours as needed for shortness of breath    . loratadine (CLARITIN) 10 MG tablet Take 10 mg by mouth daily. (0900)    . memantine (NAMENDA) 10 MG tablet Take 10 mg by mouth 2 (two) times daily. (0900 & 2000)    . Multiple Vitamins-Minerals (MULTIVITAMIN WITH MINERALS) tablet Take 1 tablet by mouth daily. (0900)    . NOVOLIN N 100 UNIT/ML injection Inject 55 Units into the skin 2 (two) times daily.    . Nutritional Supplements (PROMOD) LIQD Take 30 mLs by mouth daily.    Marland Kitchen oxybutynin (DITROPAN) 5 MG tablet Take 10 mg by mouth daily.    Marland Kitchen  polyethylene glycol powder (GLYCOLAX/MIRALAX) powder Take 17 g by mouth daily as needed (constipation).    . sertraline (ZOLOFT) 100 MG tablet Take 100 mg by mouth daily. (0900)    . sulfamethoxazole-trimethoprim (BACTRIM DS,SEPTRA DS) 800-160 MG tablet     . tamsulosin (FLOMAX) 0.4 MG CAPS capsule Take 0.4 mg by mouth daily at 8 pm. (2000)    . vitamin C (ASCORBIC ACID) 500 MG tablet Take 500 mg by mouth 2 (two) times daily.     No current facility-administered medications for this visit.     REVIEW OF SYSTEMS:  [X]  denotes positive finding, [ ]  denotes negative finding Vascular    Leg swelling    Cardiac    Chest pain or chest pressure:    Shortness of breath upon exertion:    Short of breath when lying flat:    Irregular heart rhythm:    Constitutional    Fever or chills:     PHYSICAL EXAM:   Vitals:    05/05/18 0924  BP: 126/61  Pulse: (!) 58  Resp: 18  Temp: 97.8 F (36.6 C)  TempSrc: Oral  SpO2: 95%  Weight: 191 lb (86.6 kg)  Height: 6\' 1"  (1.854 m)    GENERAL: The patient is a well-nourished male, in no acute distress. The vital signs are documented above. CARDIOVASCULAR: There is a regular rate and rhythm. PULMONARY: There is good air exchange bilaterally without wheezing or rales. The lateral aspect of the wound is not healing well but I still think there is a fighting chance that this may heal. He has a brisk peroneal signal with the Doppler so I think his pop peroneal bypass is patent.  DATA:   No new data.  MEDICAL ISSUES:   STATUS POST LEFT TRANSMETATARSAL AMPUTATION: This patient has marginal healing on the lateral aspect of his left transmetatarsal amputation.  He has single-vessel runoff via the peroneal artery so there is no further options that would significantly improve the circulation to improve healing.  Thus we will just give it every benefit of the doubt.  I have written for aggressive dressing changes at the skilled nursing facility.  He should have wet-to-dry twice daily dressing changes.  I will plan on seeing him back in 3 weeks.  He knows to call sooner if he has problems.  I did remove his staples in the office today.  Waverly Ferrarihristopher Raford Brissett [facility] [phone]

## 2018-05-19 DIAGNOSIS — R062 Wheezing: Secondary | ICD-10-CM | POA: Diagnosis not present

## 2018-05-19 DIAGNOSIS — R0602 Shortness of breath: Secondary | ICD-10-CM | POA: Diagnosis not present

## 2018-05-19 DIAGNOSIS — R05 Cough: Secondary | ICD-10-CM | POA: Diagnosis not present

## 2018-05-19 DIAGNOSIS — J90 Pleural effusion, not elsewhere classified: Secondary | ICD-10-CM | POA: Diagnosis not present

## 2018-05-19 DIAGNOSIS — I10 Essential (primary) hypertension: Secondary | ICD-10-CM | POA: Diagnosis not present

## 2018-05-19 DIAGNOSIS — R918 Other nonspecific abnormal finding of lung field: Secondary | ICD-10-CM | POA: Diagnosis not present

## 2018-05-19 DIAGNOSIS — I509 Heart failure, unspecified: Secondary | ICD-10-CM | POA: Diagnosis not present

## 2018-05-23 DIAGNOSIS — D649 Anemia, unspecified: Secondary | ICD-10-CM | POA: Diagnosis not present

## 2018-05-23 DIAGNOSIS — I10 Essential (primary) hypertension: Secondary | ICD-10-CM | POA: Diagnosis not present

## 2018-05-26 ENCOUNTER — Ambulatory Visit (INDEPENDENT_AMBULATORY_CARE_PROVIDER_SITE_OTHER): Payer: Medicare Other | Admitting: Vascular Surgery

## 2018-05-26 ENCOUNTER — Encounter: Payer: Self-pay | Admitting: Vascular Surgery

## 2018-05-26 ENCOUNTER — Other Ambulatory Visit: Payer: Self-pay

## 2018-05-26 VITALS — BP 108/60 | HR 55 | Temp 97.5°F | Resp 20 | Ht 73.0 in | Wt 198.0 lb

## 2018-05-26 DIAGNOSIS — F3341 Major depressive disorder, recurrent, in partial remission: Secondary | ICD-10-CM | POA: Diagnosis not present

## 2018-05-26 DIAGNOSIS — Z48812 Encounter for surgical aftercare following surgery on the circulatory system: Secondary | ICD-10-CM

## 2018-05-26 DIAGNOSIS — F411 Generalized anxiety disorder: Secondary | ICD-10-CM | POA: Diagnosis not present

## 2018-05-26 DIAGNOSIS — I70262 Atherosclerosis of native arteries of extremities with gangrene, left leg: Secondary | ICD-10-CM

## 2018-05-26 DIAGNOSIS — F0150 Vascular dementia without behavioral disturbance: Secondary | ICD-10-CM | POA: Diagnosis not present

## 2018-05-26 NOTE — Progress Notes (Signed)
Patient name: Terry Green MRN: [id number] DOB: 03-06-1941 Sex: male  REASON FOR VISIT:   Follow-up after left transmetatarsal amputation.  HPI:   Terry Green is a pleasant 78 y.o. male who has a left popliteal to peroneal artery bypass.  His only runoff on the left is the peroneal artery.  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  I last saw him on 05/05/2018.  He had marginal healing on the lateral aspect of the transmetatarsal amputation site.  Given that his only runoff is the peroneal artery I did not think there were any further options for revascularization.  I wrote for aggressive dressing changes by the skilled nursing facility.  He comes in for a 3-week follow-up visit.  I did remove his staples at the time of the last visit.  He has no specific complaints today.  He does not have any significant pain associated with his wounds on the left transmetatarsal amputation.  He is nonambulatory.  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  Current Outpatient Medications  Medication Sig Dispense Refill  . acetaminophen (TYLENOL) 325 MG tablet Take 650 mg by mouth every 4 (four) hours as needed for moderate pain or fever.    Marland Kitchen alum & mag hydroxide-simeth (MAALOX PLUS) 400-400-40 MG/5ML suspension Take 15 mLs by mouth every 2 (two) hours as needed for indigestion. Max 6 doses per 24 hours    . amLODipine (NORVASC) 10 MG tablet Take 10 mg by mouth daily. (0900)    . aspirin EC 81 MG tablet Take 81 mg by mouth daily. (0900)    . atorvastatin (LIPITOR) 40 MG tablet Take 40 mg by mouth daily at 8 pm.    . busPIRone (BUSPAR) 7.5 MG tablet Take 7.5 mg by mouth daily.    . carvedilol (COREG) 25 MG tablet Take 25 mg by mouth 2 (two) times daily. (0900 & 2000)    . divalproex (DEPAKOTE) 250 MG DR tablet Take 500 mg by mouth 2 (two) times daily. (0900 & 2000)    . ferrous sulfate 325 (65 FE) MG tablet Take 325  mg by mouth 2 (two) times daily.     . fluticasone (FLONASE) 50 MCG/ACT nasal spray Place 2 sprays into both nostrils daily.     Marland Kitchen gabapentin (NEURONTIN) 300 MG capsule Take 300 mg by mouth 3 (three) times daily. (0900, 1600, & 2000)    . guaiFENesin (ROBITUSSIN MUCUS+CHEST CONGEST) 100 MG/5ML liquid Take 200 mg by mouth every 4 (four) hours as needed for cough.    . hydrALAZINE (APRESOLINE) 50 MG tablet Take 50 mg by mouth 3 (three) times daily. (0800, 1600, & 2000)    . HYDROcodone-acetaminophen (NORCO/VICODIN) 5-325 MG tablet Take 1 tablet by mouth every 4 (four) hours as needed (for pain.). 30 tablet 0  . Insulin Glargine (BASAGLAR KWIKPEN) 100 UNIT/ML SOPN Inject 30 Units into the skin at bedtime. (2000)     . ipratropium-albuterol (DUONEB) 0.5-2.5 (3) MG/3ML SOLN Take 3 mLs by nebulization See admin instructions. Inhale 3 ml 4 times daily for 7 days then inhale 3 mls every 6 hours as needed for shortness of breath    . loratadine (CLARITIN) 10 MG tablet Take 10 mg by mouth daily. (0900)    . memantine (NAMENDA) 10 MG tablet Take 10 mg by mouth 2 (two) times daily. (0900 & 2000)    . Multiple Vitamins-Minerals (MULTIVITAMIN WITH  MINERALS) tablet Take 1 tablet by mouth daily. (0900)    . NOVOLIN N 100 UNIT/ML injection Inject 55 Units into the skin 2 (two) times daily.    . Nutritional Supplements (PROMOD) LIQD Take 30 mLs by mouth daily.    Marland Kitchen oxybutynin (DITROPAN) 5 MG tablet Take 10 mg by mouth daily.    . polyethylene glycol powder (GLYCOLAX/MIRALAX) powder Take 17 g by mouth daily as needed (constipation).    . sertraline (ZOLOFT) 100 MG tablet Take 100 mg by mouth daily. (0900)    . sulfamethoxazole-trimethoprim (BACTRIM DS,SEPTRA DS) 800-160 MG tablet     . tamsulosin (FLOMAX) 0.4 MG CAPS capsule Take 0.4 mg by mouth daily at 8 pm. (2000)    . vitamin C (ASCORBIC ACID) 500 MG tablet Take 500 mg by mouth 2 (two) times daily.     No current facility-administered medications for this  visit.     REVIEW OF SYSTEMS:  [X]  denotes positive finding, [ ]  denotes negative finding Vascular    Leg swelling    Cardiac    Chest pain or chest pressure:    Shortness of breath upon exertion:    Short of breath when lying flat:    Irregular heart rhythm:    Constitutional    Fever or chills:     PHYSICAL EXAM:   Vitals:   05/26/18 1318  BP: 108/60  Pulse: (!) 55  Resp: 20  Temp: (!) 97.5 F (36.4 C)  TempSrc: Oral  SpO2: 95%  Weight: 198 lb (89.8 kg)  Height: 6\' 1"  (1.854 m)    GENERAL: The patient is a well-nourished male, in no acute distress. The vital signs are documented above. CARDIOVASCULAR: There is a regular rate and rhythm. PULMONARY: There is good air exchange bilaterally without wheezing or rales. He has a brisk peroneal signal with the Doppler so I suspect that his femoral peroneal bypass graft is patent.  That is his only runoff. I did some excisional debridement of his transmetatarsal amputation site and removed some eschar overlying the wound.  DATA:   No new data.  MEDICAL ISSUES:   STATUS POST LEFT TRANSMETATARSAL AMPUTATION: I debrided the wounds on his left transmetatarsal amputation site so hopefully the dressing changes will be more effective.  I have written specific instructions for wet-to-dry dressing changes to his open wound on the left transmetatarsal amputation.  As long as the wounds not getting worse I think we can continue to give this time.  His only alternative would be a primary amputation.  Given that he is nonambulatory he would probably need a above-the-knee amputation on the left.  Hopefully the wound will start to show some signs of improvement.  He did appear to have pretty good blood flow when I debrided the wound today.  We will have him follow-up in 4 to 6 weeks.  [name] [facility] [phone]

## 2018-06-02 DIAGNOSIS — M9702XD Periprosthetic fracture around internal prosthetic left hip joint, subsequent encounter: Secondary | ICD-10-CM | POA: Diagnosis not present

## 2018-06-02 DIAGNOSIS — M6281 Muscle weakness (generalized): Secondary | ICD-10-CM | POA: Diagnosis not present

## 2018-06-02 DIAGNOSIS — R1314 Dysphagia, pharyngoesophageal phase: Secondary | ICD-10-CM | POA: Diagnosis not present

## 2018-06-02 DIAGNOSIS — I10 Essential (primary) hypertension: Secondary | ICD-10-CM | POA: Diagnosis not present

## 2018-06-02 DIAGNOSIS — R41841 Cognitive communication deficit: Secondary | ICD-10-CM | POA: Diagnosis not present

## 2018-06-02 DIAGNOSIS — D649 Anemia, unspecified: Secondary | ICD-10-CM | POA: Diagnosis not present

## 2018-06-02 DIAGNOSIS — R262 Difficulty in walking, not elsewhere classified: Secondary | ICD-10-CM | POA: Diagnosis not present

## 2018-06-03 DIAGNOSIS — M6281 Muscle weakness (generalized): Secondary | ICD-10-CM | POA: Diagnosis not present

## 2018-06-03 DIAGNOSIS — R262 Difficulty in walking, not elsewhere classified: Secondary | ICD-10-CM | POA: Diagnosis not present

## 2018-06-03 DIAGNOSIS — M9702XD Periprosthetic fracture around internal prosthetic left hip joint, subsequent encounter: Secondary | ICD-10-CM | POA: Diagnosis not present

## 2018-06-03 DIAGNOSIS — R1314 Dysphagia, pharyngoesophageal phase: Secondary | ICD-10-CM | POA: Diagnosis not present

## 2018-06-03 DIAGNOSIS — R41841 Cognitive communication deficit: Secondary | ICD-10-CM | POA: Diagnosis not present

## 2018-06-04 DIAGNOSIS — M9702XD Periprosthetic fracture around internal prosthetic left hip joint, subsequent encounter: Secondary | ICD-10-CM | POA: Diagnosis not present

## 2018-06-04 DIAGNOSIS — R1314 Dysphagia, pharyngoesophageal phase: Secondary | ICD-10-CM | POA: Diagnosis not present

## 2018-06-04 DIAGNOSIS — M6281 Muscle weakness (generalized): Secondary | ICD-10-CM | POA: Diagnosis not present

## 2018-06-04 DIAGNOSIS — R262 Difficulty in walking, not elsewhere classified: Secondary | ICD-10-CM | POA: Diagnosis not present

## 2018-06-04 DIAGNOSIS — R41841 Cognitive communication deficit: Secondary | ICD-10-CM | POA: Diagnosis not present

## 2018-06-07 DIAGNOSIS — M6281 Muscle weakness (generalized): Secondary | ICD-10-CM | POA: Diagnosis not present

## 2018-06-07 DIAGNOSIS — R262 Difficulty in walking, not elsewhere classified: Secondary | ICD-10-CM | POA: Diagnosis not present

## 2018-06-07 DIAGNOSIS — R1314 Dysphagia, pharyngoesophageal phase: Secondary | ICD-10-CM | POA: Diagnosis not present

## 2018-06-07 DIAGNOSIS — M9702XD Periprosthetic fracture around internal prosthetic left hip joint, subsequent encounter: Secondary | ICD-10-CM | POA: Diagnosis not present

## 2018-06-07 DIAGNOSIS — R41841 Cognitive communication deficit: Secondary | ICD-10-CM | POA: Diagnosis not present

## 2018-06-08 DIAGNOSIS — M9702XD Periprosthetic fracture around internal prosthetic left hip joint, subsequent encounter: Secondary | ICD-10-CM | POA: Diagnosis not present

## 2018-06-08 DIAGNOSIS — R262 Difficulty in walking, not elsewhere classified: Secondary | ICD-10-CM | POA: Diagnosis not present

## 2018-06-08 DIAGNOSIS — M6281 Muscle weakness (generalized): Secondary | ICD-10-CM | POA: Diagnosis not present

## 2018-06-08 DIAGNOSIS — R1314 Dysphagia, pharyngoesophageal phase: Secondary | ICD-10-CM | POA: Diagnosis not present

## 2018-06-08 DIAGNOSIS — R41841 Cognitive communication deficit: Secondary | ICD-10-CM | POA: Diagnosis not present

## 2018-06-09 DIAGNOSIS — M9702XD Periprosthetic fracture around internal prosthetic left hip joint, subsequent encounter: Secondary | ICD-10-CM | POA: Diagnosis not present

## 2018-06-09 DIAGNOSIS — R41841 Cognitive communication deficit: Secondary | ICD-10-CM | POA: Diagnosis not present

## 2018-06-09 DIAGNOSIS — M6281 Muscle weakness (generalized): Secondary | ICD-10-CM | POA: Diagnosis not present

## 2018-06-09 DIAGNOSIS — R262 Difficulty in walking, not elsewhere classified: Secondary | ICD-10-CM | POA: Diagnosis not present

## 2018-06-09 DIAGNOSIS — R1314 Dysphagia, pharyngoesophageal phase: Secondary | ICD-10-CM | POA: Diagnosis not present

## 2018-06-10 DIAGNOSIS — M9702XD Periprosthetic fracture around internal prosthetic left hip joint, subsequent encounter: Secondary | ICD-10-CM | POA: Diagnosis not present

## 2018-06-10 DIAGNOSIS — R1314 Dysphagia, pharyngoesophageal phase: Secondary | ICD-10-CM | POA: Diagnosis not present

## 2018-06-10 DIAGNOSIS — M6281 Muscle weakness (generalized): Secondary | ICD-10-CM | POA: Diagnosis not present

## 2018-06-10 DIAGNOSIS — R262 Difficulty in walking, not elsewhere classified: Secondary | ICD-10-CM | POA: Diagnosis not present

## 2018-06-10 DIAGNOSIS — R41841 Cognitive communication deficit: Secondary | ICD-10-CM | POA: Diagnosis not present

## 2018-06-11 DIAGNOSIS — M6281 Muscle weakness (generalized): Secondary | ICD-10-CM | POA: Diagnosis not present

## 2018-06-11 DIAGNOSIS — M9702XD Periprosthetic fracture around internal prosthetic left hip joint, subsequent encounter: Secondary | ICD-10-CM | POA: Diagnosis not present

## 2018-06-11 DIAGNOSIS — R41841 Cognitive communication deficit: Secondary | ICD-10-CM | POA: Diagnosis not present

## 2018-06-11 DIAGNOSIS — R262 Difficulty in walking, not elsewhere classified: Secondary | ICD-10-CM | POA: Diagnosis not present

## 2018-06-11 DIAGNOSIS — R1314 Dysphagia, pharyngoesophageal phase: Secondary | ICD-10-CM | POA: Diagnosis not present

## 2018-06-14 DIAGNOSIS — R262 Difficulty in walking, not elsewhere classified: Secondary | ICD-10-CM | POA: Diagnosis not present

## 2018-06-14 DIAGNOSIS — R41841 Cognitive communication deficit: Secondary | ICD-10-CM | POA: Diagnosis not present

## 2018-06-14 DIAGNOSIS — M9702XD Periprosthetic fracture around internal prosthetic left hip joint, subsequent encounter: Secondary | ICD-10-CM | POA: Diagnosis not present

## 2018-06-14 DIAGNOSIS — M6281 Muscle weakness (generalized): Secondary | ICD-10-CM | POA: Diagnosis not present

## 2018-06-14 DIAGNOSIS — R1314 Dysphagia, pharyngoesophageal phase: Secondary | ICD-10-CM | POA: Diagnosis not present

## 2018-06-15 DIAGNOSIS — R41841 Cognitive communication deficit: Secondary | ICD-10-CM | POA: Diagnosis not present

## 2018-06-15 DIAGNOSIS — R1314 Dysphagia, pharyngoesophageal phase: Secondary | ICD-10-CM | POA: Diagnosis not present

## 2018-06-15 DIAGNOSIS — R262 Difficulty in walking, not elsewhere classified: Secondary | ICD-10-CM | POA: Diagnosis not present

## 2018-06-15 DIAGNOSIS — M9702XD Periprosthetic fracture around internal prosthetic left hip joint, subsequent encounter: Secondary | ICD-10-CM | POA: Diagnosis not present

## 2018-06-15 DIAGNOSIS — M6281 Muscle weakness (generalized): Secondary | ICD-10-CM | POA: Diagnosis not present

## 2018-06-16 DIAGNOSIS — R1314 Dysphagia, pharyngoesophageal phase: Secondary | ICD-10-CM | POA: Diagnosis not present

## 2018-06-16 DIAGNOSIS — R41841 Cognitive communication deficit: Secondary | ICD-10-CM | POA: Diagnosis not present

## 2018-06-16 DIAGNOSIS — F0390 Unspecified dementia without behavioral disturbance: Secondary | ICD-10-CM | POA: Diagnosis not present

## 2018-06-16 DIAGNOSIS — E1142 Type 2 diabetes mellitus with diabetic polyneuropathy: Secondary | ICD-10-CM | POA: Diagnosis not present

## 2018-06-16 DIAGNOSIS — I10 Essential (primary) hypertension: Secondary | ICD-10-CM | POA: Diagnosis not present

## 2018-06-16 DIAGNOSIS — R262 Difficulty in walking, not elsewhere classified: Secondary | ICD-10-CM | POA: Diagnosis not present

## 2018-06-16 DIAGNOSIS — M6281 Muscle weakness (generalized): Secondary | ICD-10-CM | POA: Diagnosis not present

## 2018-06-16 DIAGNOSIS — M9702XD Periprosthetic fracture around internal prosthetic left hip joint, subsequent encounter: Secondary | ICD-10-CM | POA: Diagnosis not present

## 2018-06-17 DIAGNOSIS — M6281 Muscle weakness (generalized): Secondary | ICD-10-CM | POA: Diagnosis not present

## 2018-06-17 DIAGNOSIS — R41841 Cognitive communication deficit: Secondary | ICD-10-CM | POA: Diagnosis not present

## 2018-06-17 DIAGNOSIS — R262 Difficulty in walking, not elsewhere classified: Secondary | ICD-10-CM | POA: Diagnosis not present

## 2018-06-17 DIAGNOSIS — R1314 Dysphagia, pharyngoesophageal phase: Secondary | ICD-10-CM | POA: Diagnosis not present

## 2018-06-17 DIAGNOSIS — M9702XD Periprosthetic fracture around internal prosthetic left hip joint, subsequent encounter: Secondary | ICD-10-CM | POA: Diagnosis not present

## 2018-06-18 DIAGNOSIS — M9702XD Periprosthetic fracture around internal prosthetic left hip joint, subsequent encounter: Secondary | ICD-10-CM | POA: Diagnosis not present

## 2018-06-18 DIAGNOSIS — M6281 Muscle weakness (generalized): Secondary | ICD-10-CM | POA: Diagnosis not present

## 2018-06-18 DIAGNOSIS — R262 Difficulty in walking, not elsewhere classified: Secondary | ICD-10-CM | POA: Diagnosis not present

## 2018-06-18 DIAGNOSIS — R41841 Cognitive communication deficit: Secondary | ICD-10-CM | POA: Diagnosis not present

## 2018-06-18 DIAGNOSIS — R1314 Dysphagia, pharyngoesophageal phase: Secondary | ICD-10-CM | POA: Diagnosis not present

## 2018-06-21 DIAGNOSIS — F411 Generalized anxiety disorder: Secondary | ICD-10-CM | POA: Diagnosis not present

## 2018-06-21 DIAGNOSIS — F3341 Major depressive disorder, recurrent, in partial remission: Secondary | ICD-10-CM | POA: Diagnosis not present

## 2018-06-21 DIAGNOSIS — F0150 Vascular dementia without behavioral disturbance: Secondary | ICD-10-CM | POA: Diagnosis not present

## 2018-06-21 DIAGNOSIS — R1314 Dysphagia, pharyngoesophageal phase: Secondary | ICD-10-CM | POA: Diagnosis not present

## 2018-06-21 DIAGNOSIS — M6281 Muscle weakness (generalized): Secondary | ICD-10-CM | POA: Diagnosis not present

## 2018-06-21 DIAGNOSIS — R41841 Cognitive communication deficit: Secondary | ICD-10-CM | POA: Diagnosis not present

## 2018-06-21 DIAGNOSIS — M9702XD Periprosthetic fracture around internal prosthetic left hip joint, subsequent encounter: Secondary | ICD-10-CM | POA: Diagnosis not present

## 2018-06-21 DIAGNOSIS — R262 Difficulty in walking, not elsewhere classified: Secondary | ICD-10-CM | POA: Diagnosis not present

## 2018-06-22 DIAGNOSIS — R41841 Cognitive communication deficit: Secondary | ICD-10-CM | POA: Diagnosis not present

## 2018-06-22 DIAGNOSIS — M9702XD Periprosthetic fracture around internal prosthetic left hip joint, subsequent encounter: Secondary | ICD-10-CM | POA: Diagnosis not present

## 2018-06-22 DIAGNOSIS — R1314 Dysphagia, pharyngoesophageal phase: Secondary | ICD-10-CM | POA: Diagnosis not present

## 2018-06-22 DIAGNOSIS — M6281 Muscle weakness (generalized): Secondary | ICD-10-CM | POA: Diagnosis not present

## 2018-06-22 DIAGNOSIS — R262 Difficulty in walking, not elsewhere classified: Secondary | ICD-10-CM | POA: Diagnosis not present

## 2018-06-23 ENCOUNTER — Other Ambulatory Visit: Payer: Self-pay

## 2018-06-23 ENCOUNTER — Ambulatory Visit (INDEPENDENT_AMBULATORY_CARE_PROVIDER_SITE_OTHER): Payer: Medicare Other | Admitting: Vascular Surgery

## 2018-06-23 ENCOUNTER — Encounter: Payer: Self-pay | Admitting: Vascular Surgery

## 2018-06-23 VITALS — BP 126/63 | HR 58 | Temp 99.0°F | Resp 24 | Ht 73.0 in | Wt 198.0 lb

## 2018-06-23 DIAGNOSIS — R1314 Dysphagia, pharyngoesophageal phase: Secondary | ICD-10-CM | POA: Diagnosis not present

## 2018-06-23 DIAGNOSIS — I70243 Atherosclerosis of native arteries of left leg with ulceration of ankle: Secondary | ICD-10-CM

## 2018-06-23 DIAGNOSIS — R262 Difficulty in walking, not elsewhere classified: Secondary | ICD-10-CM | POA: Diagnosis not present

## 2018-06-23 DIAGNOSIS — M9702XD Periprosthetic fracture around internal prosthetic left hip joint, subsequent encounter: Secondary | ICD-10-CM | POA: Diagnosis not present

## 2018-06-23 DIAGNOSIS — I70262 Atherosclerosis of native arteries of extremities with gangrene, left leg: Secondary | ICD-10-CM

## 2018-06-23 DIAGNOSIS — R41841 Cognitive communication deficit: Secondary | ICD-10-CM | POA: Diagnosis not present

## 2018-06-23 DIAGNOSIS — M6281 Muscle weakness (generalized): Secondary | ICD-10-CM | POA: Diagnosis not present

## 2018-06-23 NOTE — Progress Notes (Signed)
Patient name: Terry Green MRN: [id number] DOB: 09-Feb-1941 Sex: male  REASON FOR VISIT:   Follow-up of left foot wound.  HPI:   Terry Green is a pleasant 78 y.o. male who has had a left popliteal to peroneal artery bypass.  The peroneal artery is his only runoff.  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  He has had marginal healing along the lateral aspect of this wound.  I did not think he had any further options for revascularization.  When I saw him last on 05/26/2018 he had a brisk peroneal signal with the Doppler.  This is his only runoff.  I did some excisional debridement of his transmetatarsal amputation site and removed some eschar overlying the wound.  He comes in for a follow-up visit.  Since I saw him last he has developed a new wound just above his ankle on the left.  It looks to be in the location where the Darco shoe strap comes across.  He denies fever or chills.  Past Medical History:  Diagnosis Date  . Abnormality of gait and mobility   . Anemia    iron deficiency  . Anxiety   . Arthritis    "hands" (06/06/2014)  . Bipolar disorder (HCC)   . BPH (benign prostatic hyperplasia)   . Coronary artery disease   . Dementia (HCC)   . Depression   . 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 Generalized weakness   . Hypercholesteremia   . Hypertension   . PAD (peripheral artery disease) (HCC)   . Peripheral vascular disease (HCC)   . Psychosis (HCC)   . Rheumatoid arthritis (HCC)   . Type II diabetes mellitus (HCC)    Type 2    Family History  Problem Relation Age of Onset  . Heart disease Father     SOCIAL HISTORY: Social History   Tobacco Use  . Smoking status: Former Smoker    Packs/day: 1.00    Years: 54.00    Pack years: 54.00    Types: Cigarettes    Last attempt to quit: 05/25/2012    Years since quitting: 6.0  . Smokeless tobacco: Never Used  Substance Use Topics  . Alcohol use: Not Currently    Alcohol/week:  0.0 standard drinks    Comment: 06/06/2014 "I drank 1/2 pint whiskey each day;  nothing in 20 yrs"    Allergies  Allergen Reactions  . Lisinopril Swelling    SWELLING REACTION UNSPECIFIED     Current Outpatient Medications  Medication Sig Dispense Refill  . acetaminophen (TYLENOL) 325 MG tablet Take 650 mg by mouth every 4 (four) hours as needed for moderate pain or fever.    Marland Kitchen. alum & mag hydroxide-simeth (MAALOX PLUS) 400-400-40 MG/5ML suspension Take 15 mLs by mouth every 2 (two) hours as needed for indigestion. Max 6 doses per 24 hours    . amLODipine (NORVASC) 10 MG tablet Take 10 mg by mouth daily. (0900)    . aspirin EC 81 MG tablet Take 81 mg by mouth daily. (0900)    . atorvastatin (LIPITOR) 40 MG tablet Take 40 mg by mouth daily at 8 pm.    . busPIRone (BUSPAR) 7.5 MG tablet Take 7.5 mg by mouth daily.    . carvedilol (COREG) 25 MG tablet Take 25 mg by mouth 2 (two) times daily. (0900 & 2000)    . divalproex (DEPAKOTE) 250 MG DR tablet Take 500 mg by mouth 2 (two) times daily. (0900 &  2000)    . ferrous sulfate 325 (65 FE) MG tablet Take 325 mg by mouth 2 (two) times daily.     . fluticasone (FLONASE) 50 MCG/ACT nasal spray Place 2 sprays into both nostrils daily.     Marland Kitchen gabapentin (NEURONTIN) 300 MG capsule Take 300 mg by mouth 3 (three) times daily. (0900, 1600, & 2000)    . guaiFENesin (ROBITUSSIN MUCUS+CHEST CONGEST) 100 MG/5ML liquid Take 200 mg by mouth every 4 (four) hours as needed for cough.    . hydrALAZINE (APRESOLINE) 50 MG tablet Take 50 mg by mouth 3 (three) times daily. (0800, 1600, & 2000)    . HYDROcodone-acetaminophen (NORCO/VICODIN) 5-325 MG tablet Take 1 tablet by mouth every 4 (four) hours as needed (for pain.). 30 tablet 0  . Insulin Glargine (BASAGLAR KWIKPEN) 100 UNIT/ML SOPN Inject 30 Units into the skin at bedtime. (2000)     . ipratropium-albuterol (DUONEB) 0.5-2.5 (3) MG/3ML SOLN Take 3 mLs by nebulization See admin instructions. Inhale 3 ml 4 times daily  for 7 days then inhale 3 mls every 6 hours as needed for shortness of breath    . loratadine (CLARITIN) 10 MG tablet Take 10 mg by mouth daily. (0900)    . memantine (NAMENDA) 10 MG tablet Take 10 mg by mouth 2 (two) times daily. (0900 & 2000)    . Multiple Vitamins-Minerals (MULTIVITAMIN WITH MINERALS) tablet Take 1 tablet by mouth daily. (0900)    . NOVOLIN N 100 UNIT/ML injection Inject 55 Units into the skin 2 (two) times daily.    . Nutritional Supplements (PROMOD) LIQD Take 30 mLs by mouth daily.    Marland Kitchen oxybutynin (DITROPAN) 5 MG tablet Take 10 mg by mouth daily.    . polyethylene glycol powder (GLYCOLAX/MIRALAX) powder Take 17 g by mouth daily as needed (constipation).    . sertraline (ZOLOFT) 100 MG tablet Take 100 mg by mouth daily. (0900)    . sulfamethoxazole-trimethoprim (BACTRIM DS,SEPTRA DS) 800-160 MG tablet     . tamsulosin (FLOMAX) 0.4 MG CAPS capsule Take 0.4 mg by mouth daily at 8 pm. (2000)    . vitamin C (ASCORBIC ACID) 500 MG tablet Take 500 mg by mouth 2 (two) times daily.     No current facility-administered medications for this visit.     REVIEW OF SYSTEMS:  [X]  denotes positive finding, [ ]  denotes negative finding Cardiac  Comments:  Chest pain or chest pressure:    Shortness of breath upon exertion:    Short of breath when lying flat:    Irregular heart rhythm:        Vascular    Pain in calf, thigh, or hip brought on by ambulation:    Pain in feet at night that wakes you up from your sleep:     Blood clot in your veins:    Leg swelling:         Pulmonary    Oxygen at home:    Productive cough:     Wheezing:         Neurologic    Sudden weakness in arms or legs:     Sudden numbness in arms or legs:     Sudden onset of difficulty speaking or slurred speech:    Temporary loss of vision in one eye:     Problems with dizziness:         Gastrointestinal    Blood in stool:     Vomited blood:  Genitourinary    Burning when urinating:     Blood  in urine:        Psychiatric    Major depression:         Hematologic    Bleeding problems:    Problems with blood clotting too easily:        Skin    Rashes or ulcers:        Constitutional    Fever or chills:     PHYSICAL EXAM:   Vitals:   06/23/18 1237  BP: 126/63  Pulse: (!) 58  Resp: (!) 24  Temp: 99 F (37.2 C)  SpO2: 95%  Weight: 198 lb (89.8 kg)  Height: 6\' 1"  (1.854 m)    GENERAL: The patient is a well-nourished male, in no acute distress. The vital signs are documented above. CARDIAC: There is a regular rate and rhythm.  VASCULAR: He has a brisk peroneal signal with the Doppler on the left. PULMONARY: There is good air exchange bilaterally without wheezing or rales. SKIN: The wound on the lateral aspect of his left transmetatarsal amputation site is improving.  However, he has developed a new wound on the distal medial left leg perhaps where the strap from his Darco boot has been.  This is superficial and has good granulation tissue.    PSYCHIATRIC: The patient has a normal affect.  DATA:    No new data  MEDICAL ISSUES:   NONHEALING WOUNDS LEFT FOOT: The patient has good granulation tissue in both of his wounds and I think he has adequate circulation to heal this.  However his only runoff is the peroneal artery and so he still not out of the woods.  He has a femoral peroneal bypass graft which is patent with a brisk peroneal signal with the Doppler.  I have asked him to stop wound using the Darco shoe given that the strap may have contributed to the wound across his ankle.  I have written for dressing changes with hydrogel.  I will plan on seeing him back in 6 weeks.  He knows to call sooner if he has problems.  Waverly Ferrarihristopher Dickson [facility] [phone]

## 2018-06-24 DIAGNOSIS — R1314 Dysphagia, pharyngoesophageal phase: Secondary | ICD-10-CM | POA: Diagnosis not present

## 2018-06-24 DIAGNOSIS — R262 Difficulty in walking, not elsewhere classified: Secondary | ICD-10-CM | POA: Diagnosis not present

## 2018-06-24 DIAGNOSIS — R41841 Cognitive communication deficit: Secondary | ICD-10-CM | POA: Diagnosis not present

## 2018-06-24 DIAGNOSIS — M6281 Muscle weakness (generalized): Secondary | ICD-10-CM | POA: Diagnosis not present

## 2018-06-24 DIAGNOSIS — M9702XD Periprosthetic fracture around internal prosthetic left hip joint, subsequent encounter: Secondary | ICD-10-CM | POA: Diagnosis not present

## 2018-06-25 DIAGNOSIS — R41841 Cognitive communication deficit: Secondary | ICD-10-CM | POA: Diagnosis not present

## 2018-06-25 DIAGNOSIS — M6281 Muscle weakness (generalized): Secondary | ICD-10-CM | POA: Diagnosis not present

## 2018-06-25 DIAGNOSIS — R262 Difficulty in walking, not elsewhere classified: Secondary | ICD-10-CM | POA: Diagnosis not present

## 2018-06-25 DIAGNOSIS — R1314 Dysphagia, pharyngoesophageal phase: Secondary | ICD-10-CM | POA: Diagnosis not present

## 2018-06-25 DIAGNOSIS — M9702XD Periprosthetic fracture around internal prosthetic left hip joint, subsequent encounter: Secondary | ICD-10-CM | POA: Diagnosis not present

## 2018-06-28 DIAGNOSIS — R1314 Dysphagia, pharyngoesophageal phase: Secondary | ICD-10-CM | POA: Diagnosis not present

## 2018-06-28 DIAGNOSIS — M9702XD Periprosthetic fracture around internal prosthetic left hip joint, subsequent encounter: Secondary | ICD-10-CM | POA: Diagnosis not present

## 2018-06-28 DIAGNOSIS — R41841 Cognitive communication deficit: Secondary | ICD-10-CM | POA: Diagnosis not present

## 2018-06-28 DIAGNOSIS — R262 Difficulty in walking, not elsewhere classified: Secondary | ICD-10-CM | POA: Diagnosis not present

## 2018-06-28 DIAGNOSIS — M6281 Muscle weakness (generalized): Secondary | ICD-10-CM | POA: Diagnosis not present

## 2018-06-29 DIAGNOSIS — R41841 Cognitive communication deficit: Secondary | ICD-10-CM | POA: Diagnosis not present

## 2018-06-29 DIAGNOSIS — M9702XD Periprosthetic fracture around internal prosthetic left hip joint, subsequent encounter: Secondary | ICD-10-CM | POA: Diagnosis not present

## 2018-06-29 DIAGNOSIS — R262 Difficulty in walking, not elsewhere classified: Secondary | ICD-10-CM | POA: Diagnosis not present

## 2018-06-29 DIAGNOSIS — R1314 Dysphagia, pharyngoesophageal phase: Secondary | ICD-10-CM | POA: Diagnosis not present

## 2018-06-29 DIAGNOSIS — M6281 Muscle weakness (generalized): Secondary | ICD-10-CM | POA: Diagnosis not present

## 2018-06-30 ENCOUNTER — Ambulatory Visit: Payer: Medicare Other | Admitting: Vascular Surgery

## 2018-06-30 DIAGNOSIS — M6281 Muscle weakness (generalized): Secondary | ICD-10-CM | POA: Diagnosis not present

## 2018-06-30 DIAGNOSIS — R1314 Dysphagia, pharyngoesophageal phase: Secondary | ICD-10-CM | POA: Diagnosis not present

## 2018-06-30 DIAGNOSIS — M9702XD Periprosthetic fracture around internal prosthetic left hip joint, subsequent encounter: Secondary | ICD-10-CM | POA: Diagnosis not present

## 2018-06-30 DIAGNOSIS — R41841 Cognitive communication deficit: Secondary | ICD-10-CM | POA: Diagnosis not present

## 2018-06-30 DIAGNOSIS — R262 Difficulty in walking, not elsewhere classified: Secondary | ICD-10-CM | POA: Diagnosis not present

## 2018-07-01 ENCOUNTER — Ambulatory Visit: Payer: Medicare Other | Admitting: Family

## 2018-07-01 DIAGNOSIS — R1314 Dysphagia, pharyngoesophageal phase: Secondary | ICD-10-CM | POA: Diagnosis not present

## 2018-07-01 DIAGNOSIS — M6281 Muscle weakness (generalized): Secondary | ICD-10-CM | POA: Diagnosis not present

## 2018-07-01 DIAGNOSIS — R41841 Cognitive communication deficit: Secondary | ICD-10-CM | POA: Diagnosis not present

## 2018-07-01 DIAGNOSIS — M9702XD Periprosthetic fracture around internal prosthetic left hip joint, subsequent encounter: Secondary | ICD-10-CM | POA: Diagnosis not present

## 2018-07-01 DIAGNOSIS — R262 Difficulty in walking, not elsewhere classified: Secondary | ICD-10-CM | POA: Diagnosis not present

## 2018-07-02 DIAGNOSIS — R41841 Cognitive communication deficit: Secondary | ICD-10-CM | POA: Diagnosis not present

## 2018-07-02 DIAGNOSIS — R262 Difficulty in walking, not elsewhere classified: Secondary | ICD-10-CM | POA: Diagnosis not present

## 2018-07-02 DIAGNOSIS — M9702XD Periprosthetic fracture around internal prosthetic left hip joint, subsequent encounter: Secondary | ICD-10-CM | POA: Diagnosis not present

## 2018-07-02 DIAGNOSIS — R1314 Dysphagia, pharyngoesophageal phase: Secondary | ICD-10-CM | POA: Diagnosis not present

## 2018-07-02 DIAGNOSIS — M6281 Muscle weakness (generalized): Secondary | ICD-10-CM | POA: Diagnosis not present

## 2018-07-04 DIAGNOSIS — Z89432 Acquired absence of left foot: Secondary | ICD-10-CM | POA: Diagnosis not present

## 2018-07-05 DIAGNOSIS — R41841 Cognitive communication deficit: Secondary | ICD-10-CM | POA: Diagnosis not present

## 2018-07-05 DIAGNOSIS — R569 Unspecified convulsions: Secondary | ICD-10-CM | POA: Diagnosis not present

## 2018-07-05 DIAGNOSIS — R1314 Dysphagia, pharyngoesophageal phase: Secondary | ICD-10-CM | POA: Diagnosis not present

## 2018-07-05 DIAGNOSIS — I10 Essential (primary) hypertension: Secondary | ICD-10-CM | POA: Diagnosis not present

## 2018-07-05 DIAGNOSIS — E119 Type 2 diabetes mellitus without complications: Secondary | ICD-10-CM | POA: Diagnosis not present

## 2018-07-05 DIAGNOSIS — R262 Difficulty in walking, not elsewhere classified: Secondary | ICD-10-CM | POA: Diagnosis not present

## 2018-07-05 DIAGNOSIS — M6281 Muscle weakness (generalized): Secondary | ICD-10-CM | POA: Diagnosis not present

## 2018-07-05 DIAGNOSIS — M9702XD Periprosthetic fracture around internal prosthetic left hip joint, subsequent encounter: Secondary | ICD-10-CM | POA: Diagnosis not present

## 2018-07-06 DIAGNOSIS — R1314 Dysphagia, pharyngoesophageal phase: Secondary | ICD-10-CM | POA: Diagnosis not present

## 2018-07-06 DIAGNOSIS — M9702XD Periprosthetic fracture around internal prosthetic left hip joint, subsequent encounter: Secondary | ICD-10-CM | POA: Diagnosis not present

## 2018-07-06 DIAGNOSIS — M6281 Muscle weakness (generalized): Secondary | ICD-10-CM | POA: Diagnosis not present

## 2018-07-06 DIAGNOSIS — R41841 Cognitive communication deficit: Secondary | ICD-10-CM | POA: Diagnosis not present

## 2018-07-06 DIAGNOSIS — R262 Difficulty in walking, not elsewhere classified: Secondary | ICD-10-CM | POA: Diagnosis not present

## 2018-07-07 DIAGNOSIS — M9702XD Periprosthetic fracture around internal prosthetic left hip joint, subsequent encounter: Secondary | ICD-10-CM | POA: Diagnosis not present

## 2018-07-07 DIAGNOSIS — R41841 Cognitive communication deficit: Secondary | ICD-10-CM | POA: Diagnosis not present

## 2018-07-07 DIAGNOSIS — M6281 Muscle weakness (generalized): Secondary | ICD-10-CM | POA: Diagnosis not present

## 2018-07-07 DIAGNOSIS — R1314 Dysphagia, pharyngoesophageal phase: Secondary | ICD-10-CM | POA: Diagnosis not present

## 2018-07-07 DIAGNOSIS — R262 Difficulty in walking, not elsewhere classified: Secondary | ICD-10-CM | POA: Diagnosis not present

## 2018-07-08 DIAGNOSIS — R41841 Cognitive communication deficit: Secondary | ICD-10-CM | POA: Diagnosis not present

## 2018-07-08 DIAGNOSIS — M9702XD Periprosthetic fracture around internal prosthetic left hip joint, subsequent encounter: Secondary | ICD-10-CM | POA: Diagnosis not present

## 2018-07-08 DIAGNOSIS — M6281 Muscle weakness (generalized): Secondary | ICD-10-CM | POA: Diagnosis not present

## 2018-07-08 DIAGNOSIS — R262 Difficulty in walking, not elsewhere classified: Secondary | ICD-10-CM | POA: Diagnosis not present

## 2018-07-08 DIAGNOSIS — R1314 Dysphagia, pharyngoesophageal phase: Secondary | ICD-10-CM | POA: Diagnosis not present

## 2018-07-12 DIAGNOSIS — M6281 Muscle weakness (generalized): Secondary | ICD-10-CM | POA: Diagnosis not present

## 2018-07-12 DIAGNOSIS — E1142 Type 2 diabetes mellitus with diabetic polyneuropathy: Secondary | ICD-10-CM | POA: Diagnosis not present

## 2018-07-12 DIAGNOSIS — I10 Essential (primary) hypertension: Secondary | ICD-10-CM | POA: Diagnosis not present

## 2018-07-12 DIAGNOSIS — M1990 Unspecified osteoarthritis, unspecified site: Secondary | ICD-10-CM | POA: Diagnosis not present

## 2018-07-19 DIAGNOSIS — F411 Generalized anxiety disorder: Secondary | ICD-10-CM | POA: Diagnosis not present

## 2018-07-19 DIAGNOSIS — F3341 Major depressive disorder, recurrent, in partial remission: Secondary | ICD-10-CM | POA: Diagnosis not present

## 2018-07-19 DIAGNOSIS — F0150 Vascular dementia without behavioral disturbance: Secondary | ICD-10-CM | POA: Diagnosis not present

## 2018-07-27 DIAGNOSIS — R1314 Dysphagia, pharyngoesophageal phase: Secondary | ICD-10-CM | POA: Diagnosis not present

## 2018-07-27 DIAGNOSIS — R41841 Cognitive communication deficit: Secondary | ICD-10-CM | POA: Diagnosis not present

## 2018-07-27 DIAGNOSIS — M6281 Muscle weakness (generalized): Secondary | ICD-10-CM | POA: Diagnosis not present

## 2018-07-27 DIAGNOSIS — M9702XD Periprosthetic fracture around internal prosthetic left hip joint, subsequent encounter: Secondary | ICD-10-CM | POA: Diagnosis not present

## 2018-07-27 DIAGNOSIS — R262 Difficulty in walking, not elsewhere classified: Secondary | ICD-10-CM | POA: Diagnosis not present

## 2018-07-28 DIAGNOSIS — M6281 Muscle weakness (generalized): Secondary | ICD-10-CM | POA: Diagnosis not present

## 2018-07-28 DIAGNOSIS — R41841 Cognitive communication deficit: Secondary | ICD-10-CM | POA: Diagnosis not present

## 2018-07-28 DIAGNOSIS — R1314 Dysphagia, pharyngoesophageal phase: Secondary | ICD-10-CM | POA: Diagnosis not present

## 2018-07-28 DIAGNOSIS — T8149XA Infection following a procedure, other surgical site, initial encounter: Secondary | ICD-10-CM | POA: Diagnosis not present

## 2018-07-28 DIAGNOSIS — R262 Difficulty in walking, not elsewhere classified: Secondary | ICD-10-CM | POA: Diagnosis not present

## 2018-07-28 DIAGNOSIS — M9702XD Periprosthetic fracture around internal prosthetic left hip joint, subsequent encounter: Secondary | ICD-10-CM | POA: Diagnosis not present

## 2018-07-28 DIAGNOSIS — Z89432 Acquired absence of left foot: Secondary | ICD-10-CM | POA: Diagnosis not present

## 2018-07-29 DIAGNOSIS — M9702XD Periprosthetic fracture around internal prosthetic left hip joint, subsequent encounter: Secondary | ICD-10-CM | POA: Diagnosis not present

## 2018-07-29 DIAGNOSIS — R1314 Dysphagia, pharyngoesophageal phase: Secondary | ICD-10-CM | POA: Diagnosis not present

## 2018-07-29 DIAGNOSIS — R41841 Cognitive communication deficit: Secondary | ICD-10-CM | POA: Diagnosis not present

## 2018-07-29 DIAGNOSIS — R262 Difficulty in walking, not elsewhere classified: Secondary | ICD-10-CM | POA: Diagnosis not present

## 2018-07-29 DIAGNOSIS — M6281 Muscle weakness (generalized): Secondary | ICD-10-CM | POA: Diagnosis not present

## 2018-07-30 DIAGNOSIS — R41841 Cognitive communication deficit: Secondary | ICD-10-CM | POA: Diagnosis not present

## 2018-07-30 DIAGNOSIS — M6281 Muscle weakness (generalized): Secondary | ICD-10-CM | POA: Diagnosis not present

## 2018-07-30 DIAGNOSIS — R262 Difficulty in walking, not elsewhere classified: Secondary | ICD-10-CM | POA: Diagnosis not present

## 2018-07-30 DIAGNOSIS — M9702XD Periprosthetic fracture around internal prosthetic left hip joint, subsequent encounter: Secondary | ICD-10-CM | POA: Diagnosis not present

## 2018-07-30 DIAGNOSIS — R1314 Dysphagia, pharyngoesophageal phase: Secondary | ICD-10-CM | POA: Diagnosis not present

## 2018-08-03 ENCOUNTER — Telehealth (HOSPITAL_COMMUNITY): Payer: Self-pay | Admitting: Rehabilitation

## 2018-08-03 DIAGNOSIS — M9702XD Periprosthetic fracture around internal prosthetic left hip joint, subsequent encounter: Secondary | ICD-10-CM | POA: Diagnosis not present

## 2018-08-03 DIAGNOSIS — R41841 Cognitive communication deficit: Secondary | ICD-10-CM | POA: Diagnosis not present

## 2018-08-03 DIAGNOSIS — R262 Difficulty in walking, not elsewhere classified: Secondary | ICD-10-CM | POA: Diagnosis not present

## 2018-08-03 DIAGNOSIS — M6281 Muscle weakness (generalized): Secondary | ICD-10-CM | POA: Diagnosis not present

## 2018-08-03 DIAGNOSIS — R1314 Dysphagia, pharyngoesophageal phase: Secondary | ICD-10-CM | POA: Diagnosis not present

## 2018-08-03 NOTE — Telephone Encounter (Signed)
The above patient or their representative was contacted and gave the following answers to these questions:         Do you have any of the following symptoms? No  Fever                    Cough                   Shortness of breath  Do  you have any of the following other symptoms? No   muscle pain         vomiting,        diarrhea        rash         weakness        red eye        abdominal pain         bruising          bruising or bleeding              joint pain           severe headache    Have you been in contact with someone who was or has been sick in the past 2 weeks? No  Yes                 Unsure                         Unable to assess   Does the person that you were in contact with have any of the following symptoms?   Cough         shortness of breath           muscle pain         vomiting,            diarrhea            rash            weakness           fever            red eye           abdominal pain           bruising  or  bleeding                joint pain                severe headache               Have you  or someone you have been in contact with traveled internationally in th last month? No         If yes, which countries?   Have you  or someone you have been in contact with traveled outside [place] in th last month? No        If yes, which state and city?   COMMENTS OR ACTION PLAN FOR THIS PATIENT:  Questionnaire was answered by "Kathie Rhodes" from the [facility] [place]. She will accompany him to appt. Both pt and transporter will be masked. No covid cases in facility.

## 2018-08-04 ENCOUNTER — Encounter: Payer: Self-pay | Admitting: Vascular Surgery

## 2018-08-04 ENCOUNTER — Other Ambulatory Visit: Payer: Self-pay

## 2018-08-04 ENCOUNTER — Ambulatory Visit (INDEPENDENT_AMBULATORY_CARE_PROVIDER_SITE_OTHER): Payer: Medicare Other | Admitting: Vascular Surgery

## 2018-08-04 VITALS — BP 146/77 | HR 56 | Temp 98.0°F | Resp 20 | Ht 73.0 in | Wt 198.0 lb

## 2018-08-04 DIAGNOSIS — I70262 Atherosclerosis of native arteries of extremities with gangrene, left leg: Secondary | ICD-10-CM

## 2018-08-04 DIAGNOSIS — R262 Difficulty in walking, not elsewhere classified: Secondary | ICD-10-CM | POA: Diagnosis not present

## 2018-08-04 DIAGNOSIS — R1314 Dysphagia, pharyngoesophageal phase: Secondary | ICD-10-CM | POA: Diagnosis not present

## 2018-08-04 DIAGNOSIS — R41841 Cognitive communication deficit: Secondary | ICD-10-CM | POA: Diagnosis not present

## 2018-08-04 DIAGNOSIS — M6281 Muscle weakness (generalized): Secondary | ICD-10-CM | POA: Diagnosis not present

## 2018-08-04 DIAGNOSIS — M9702XD Periprosthetic fracture around internal prosthetic left hip joint, subsequent encounter: Secondary | ICD-10-CM | POA: Diagnosis not present

## 2018-08-04 NOTE — Progress Notes (Signed)
Patient name: Terry Green MRN: [id number] DOB: [birth year] Sex: male  REASON FOR VISIT:   Follow-up of left foot wound.  HPI:   Terry Green is a pleasant 78 y.o. male who I last saw on 06/23/2018.  He has had a left popliteal to peroneal artery bypass.  He has peroneal artery runoff only on the left.  He is also undergone a left transmetatarsal amputation on 04/06/2018.  He really does not have any further options for revascularization.  At the time of his last visit I thought that his transmetatarsal amputation wound was not healing.  However he had developed a new wound on the distal medial leg.  This was superficial and had good granulation tissue.  At that visit I recommended that he stop using the Darco shoe and do dressing changes with hydrogel.  He comes in for 6-week follow-up visit.  Since I saw him last he does complain of some pain in the wounds on the left foot.  The nurses have been doing the dressing changes daily with hydrogel.  He denies fever or chills.  His appetite is not good.  Current Outpatient Medications  Medication Sig Dispense Refill  . acetaminophen (TYLENOL) 325 MG tablet Take 650 mg by mouth every 4 (four) hours as needed for moderate pain or fever.    Marland Kitchen alum & mag hydroxide-simeth (MAALOX PLUS) 400-400-40 MG/5ML suspension Take 15 mLs by mouth every 2 (two) hours as needed for indigestion. Max 6 doses per 24 hours    . amLODipine (NORVASC) 10 MG tablet Take 10 mg by mouth daily. (0900)    . aspirin EC 81 MG tablet Take 81 mg by mouth daily. (0900)    . atorvastatin (LIPITOR) 40 MG tablet Take 40 mg by mouth daily at 8 pm.    . busPIRone (BUSPAR) 7.5 MG tablet Take 7.5 mg by mouth daily.    . carvedilol (COREG) 25 MG tablet Take 25 mg by mouth 2 (two) times daily. (0900 & 2000)    . divalproex (DEPAKOTE) 250 MG DR tablet Take 500 mg by mouth 2 (two) times daily. (0900 & 2000)    . ferrous sulfate 325 (65 FE) MG tablet Take 325 mg by mouth 2 (two) times daily.      . fluticasone (FLONASE) 50 MCG/ACT nasal spray Place 2 sprays into both nostrils daily.     Marland Kitchen gabapentin (NEURONTIN) 300 MG capsule Take 300 mg by mouth 3 (three) times daily. (0900, 1600, & 2000)    . guaiFENesin (ROBITUSSIN MUCUS+CHEST CONGEST) 100 MG/5ML liquid Take 200 mg by mouth every 4 (four) hours as needed for cough.    . hydrALAZINE (APRESOLINE) 50 MG tablet Take 50 mg by mouth 3 (three) times daily. (0800, 1600, & 2000)    . HYDROcodone-acetaminophen (NORCO/VICODIN) 5-325 MG tablet Take 1 tablet by mouth every 4 (four) hours as needed (for pain.). 30 tablet 0  . Insulin Glargine (BASAGLAR KWIKPEN) 100 UNIT/ML SOPN Inject 30 Units into the skin at bedtime. (2000)     . ipratropium-albuterol (DUONEB) 0.5-2.5 (3) MG/3ML SOLN Take 3 mLs by nebulization See admin instructions. Inhale 3 ml 4 times daily for 7 days then inhale 3 mls every 6 hours as needed for shortness of breath    . loratadine (CLARITIN) 10 MG tablet Take 10 mg by mouth daily. (0900)    . memantine (NAMENDA) 10 MG tablet Take 10 mg by mouth 2 (two) times daily. (0900 & 2000)    .  Multiple Vitamins-Minerals (MULTIVITAMIN WITH MINERALS) tablet Take 1 tablet by mouth daily. (0900)    . NOVOLIN N 100 UNIT/ML injection Inject 55 Units into the skin 2 (two) times daily.    . Nutritional Supplements (PROMOD) LIQD Take 30 mLs by mouth daily.    Marland Kitchen. oxybutynin (DITROPAN) 5 MG tablet Take 10 mg by mouth daily.    . polyethylene glycol powder (GLYCOLAX/MIRALAX) powder Take 17 g by mouth daily as needed (constipation).    . sertraline (ZOLOFT) 100 MG tablet Take 100 mg by mouth daily. (0900)    . sulfamethoxazole-trimethoprim (BACTRIM DS,SEPTRA DS) 800-160 MG tablet     . tamsulosin (FLOMAX) 0.4 MG CAPS capsule Take 0.4 mg by mouth daily at 8 pm. (2000)    . vitamin C (ASCORBIC ACID) 500 MG tablet Take 500 mg by mouth 2 (two) times daily.     No current facility-administered medications for this visit.     REVIEW OF SYSTEMS:  [X]   denotes positive finding, [ ]  denotes negative finding Vascular    Leg swelling    Cardiac    Chest pain or chest pressure:    Shortness of breath upon exertion:    Short of breath when lying flat:    Irregular heart rhythm:    Constitutional    Fever or chills:     PHYSICAL EXAM:   Vitals:   08/04/18 1136  BP: (!) 146/77  Pulse: (!) 56  Resp: 20  Temp: 98 F (36.7 C)  Weight: 198 lb (89.8 kg)  Height: 6\' 1"  (1.854 m)    GENERAL: The patient is a well-nourished male, in no acute distress. The vital signs are documented above. CARDIOVASCULAR: There is a regular rate and rhythm. PULMONARY: There is good air exchange bilaterally without wheezing or rales. VASCULAR: He has a brisk peroneal signal with the Doppler.  This is his only runoff. EXTREMITIES: The wound on the medial aspect of his distal left leg measures 6 cm in length by 3 cm in width. I debrided the lateral aspect of his transmetatarsal amputation which has devitalized tissue.  This measures 2 cm in diameter. Pictures are noted below.       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  There are no further options for revascularization.  Thus if these wounds do not heal he would require an above-the-knee amputation.  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  Given that this is the only remaining option I have recommended a continued aggressive wound care.  I debrided the lateral aspect of his transmetatarsal amputation in the office today.  I written for continued dressing changes and I will plan on seeing him back in 6 weeks.  He knows to call sooner if he has problems.  Terry Green [facility] [phone]

## 2018-08-05 DIAGNOSIS — R1314 Dysphagia, pharyngoesophageal phase: Secondary | ICD-10-CM | POA: Diagnosis not present

## 2018-08-05 DIAGNOSIS — R41841 Cognitive communication deficit: Secondary | ICD-10-CM | POA: Diagnosis not present

## 2018-08-05 DIAGNOSIS — M9702XD Periprosthetic fracture around internal prosthetic left hip joint, subsequent encounter: Secondary | ICD-10-CM | POA: Diagnosis not present

## 2018-08-05 DIAGNOSIS — R262 Difficulty in walking, not elsewhere classified: Secondary | ICD-10-CM | POA: Diagnosis not present

## 2018-08-05 DIAGNOSIS — M6281 Muscle weakness (generalized): Secondary | ICD-10-CM | POA: Diagnosis not present

## 2018-08-06 DIAGNOSIS — M9702XD Periprosthetic fracture around internal prosthetic left hip joint, subsequent encounter: Secondary | ICD-10-CM | POA: Diagnosis not present

## 2018-08-06 DIAGNOSIS — R41841 Cognitive communication deficit: Secondary | ICD-10-CM | POA: Diagnosis not present

## 2018-08-06 DIAGNOSIS — R1314 Dysphagia, pharyngoesophageal phase: Secondary | ICD-10-CM | POA: Diagnosis not present

## 2018-08-06 DIAGNOSIS — R262 Difficulty in walking, not elsewhere classified: Secondary | ICD-10-CM | POA: Diagnosis not present

## 2018-08-06 DIAGNOSIS — M6281 Muscle weakness (generalized): Secondary | ICD-10-CM | POA: Diagnosis not present

## 2018-08-09 DIAGNOSIS — R1314 Dysphagia, pharyngoesophageal phase: Secondary | ICD-10-CM | POA: Diagnosis not present

## 2018-08-09 DIAGNOSIS — M6281 Muscle weakness (generalized): Secondary | ICD-10-CM | POA: Diagnosis not present

## 2018-08-09 DIAGNOSIS — R41841 Cognitive communication deficit: Secondary | ICD-10-CM | POA: Diagnosis not present

## 2018-08-09 DIAGNOSIS — M9702XD Periprosthetic fracture around internal prosthetic left hip joint, subsequent encounter: Secondary | ICD-10-CM | POA: Diagnosis not present

## 2018-08-09 DIAGNOSIS — R262 Difficulty in walking, not elsewhere classified: Secondary | ICD-10-CM | POA: Diagnosis not present

## 2018-08-10 DIAGNOSIS — R1314 Dysphagia, pharyngoesophageal phase: Secondary | ICD-10-CM | POA: Diagnosis not present

## 2018-08-10 DIAGNOSIS — R41841 Cognitive communication deficit: Secondary | ICD-10-CM | POA: Diagnosis not present

## 2018-08-10 DIAGNOSIS — M9702XD Periprosthetic fracture around internal prosthetic left hip joint, subsequent encounter: Secondary | ICD-10-CM | POA: Diagnosis not present

## 2018-08-10 DIAGNOSIS — R262 Difficulty in walking, not elsewhere classified: Secondary | ICD-10-CM | POA: Diagnosis not present

## 2018-08-10 DIAGNOSIS — M6281 Muscle weakness (generalized): Secondary | ICD-10-CM | POA: Diagnosis not present

## 2018-08-11 DIAGNOSIS — R1314 Dysphagia, pharyngoesophageal phase: Secondary | ICD-10-CM | POA: Diagnosis not present

## 2018-08-11 DIAGNOSIS — R41841 Cognitive communication deficit: Secondary | ICD-10-CM | POA: Diagnosis not present

## 2018-08-11 DIAGNOSIS — M9702XD Periprosthetic fracture around internal prosthetic left hip joint, subsequent encounter: Secondary | ICD-10-CM | POA: Diagnosis not present

## 2018-08-11 DIAGNOSIS — M6281 Muscle weakness (generalized): Secondary | ICD-10-CM | POA: Diagnosis not present

## 2018-08-11 DIAGNOSIS — R262 Difficulty in walking, not elsewhere classified: Secondary | ICD-10-CM | POA: Diagnosis not present

## 2018-08-12 DIAGNOSIS — M6281 Muscle weakness (generalized): Secondary | ICD-10-CM | POA: Diagnosis not present

## 2018-08-12 DIAGNOSIS — R1314 Dysphagia, pharyngoesophageal phase: Secondary | ICD-10-CM | POA: Diagnosis not present

## 2018-08-12 DIAGNOSIS — R262 Difficulty in walking, not elsewhere classified: Secondary | ICD-10-CM | POA: Diagnosis not present

## 2018-08-12 DIAGNOSIS — M9702XD Periprosthetic fracture around internal prosthetic left hip joint, subsequent encounter: Secondary | ICD-10-CM | POA: Diagnosis not present

## 2018-08-12 DIAGNOSIS — R41841 Cognitive communication deficit: Secondary | ICD-10-CM | POA: Diagnosis not present

## 2018-08-13 DIAGNOSIS — R1314 Dysphagia, pharyngoesophageal phase: Secondary | ICD-10-CM | POA: Diagnosis not present

## 2018-08-13 DIAGNOSIS — R41841 Cognitive communication deficit: Secondary | ICD-10-CM | POA: Diagnosis not present

## 2018-08-13 DIAGNOSIS — R262 Difficulty in walking, not elsewhere classified: Secondary | ICD-10-CM | POA: Diagnosis not present

## 2018-08-13 DIAGNOSIS — M6281 Muscle weakness (generalized): Secondary | ICD-10-CM | POA: Diagnosis not present

## 2018-08-13 DIAGNOSIS — M9702XD Periprosthetic fracture around internal prosthetic left hip joint, subsequent encounter: Secondary | ICD-10-CM | POA: Diagnosis not present

## 2018-08-16 DIAGNOSIS — M9702XD Periprosthetic fracture around internal prosthetic left hip joint, subsequent encounter: Secondary | ICD-10-CM | POA: Diagnosis not present

## 2018-08-16 DIAGNOSIS — R41841 Cognitive communication deficit: Secondary | ICD-10-CM | POA: Diagnosis not present

## 2018-08-16 DIAGNOSIS — F411 Generalized anxiety disorder: Secondary | ICD-10-CM | POA: Diagnosis not present

## 2018-08-16 DIAGNOSIS — R262 Difficulty in walking, not elsewhere classified: Secondary | ICD-10-CM | POA: Diagnosis not present

## 2018-08-16 DIAGNOSIS — F3341 Major depressive disorder, recurrent, in partial remission: Secondary | ICD-10-CM | POA: Diagnosis not present

## 2018-08-16 DIAGNOSIS — F0150 Vascular dementia without behavioral disturbance: Secondary | ICD-10-CM | POA: Diagnosis not present

## 2018-08-16 DIAGNOSIS — M6281 Muscle weakness (generalized): Secondary | ICD-10-CM | POA: Diagnosis not present

## 2018-08-16 DIAGNOSIS — R1314 Dysphagia, pharyngoesophageal phase: Secondary | ICD-10-CM | POA: Diagnosis not present

## 2018-08-17 DIAGNOSIS — R262 Difficulty in walking, not elsewhere classified: Secondary | ICD-10-CM | POA: Diagnosis not present

## 2018-08-17 DIAGNOSIS — M9702XD Periprosthetic fracture around internal prosthetic left hip joint, subsequent encounter: Secondary | ICD-10-CM | POA: Diagnosis not present

## 2018-08-17 DIAGNOSIS — R1314 Dysphagia, pharyngoesophageal phase: Secondary | ICD-10-CM | POA: Diagnosis not present

## 2018-08-17 DIAGNOSIS — M6281 Muscle weakness (generalized): Secondary | ICD-10-CM | POA: Diagnosis not present

## 2018-08-17 DIAGNOSIS — R41841 Cognitive communication deficit: Secondary | ICD-10-CM | POA: Diagnosis not present

## 2018-08-18 DIAGNOSIS — M6281 Muscle weakness (generalized): Secondary | ICD-10-CM | POA: Diagnosis not present

## 2018-08-18 DIAGNOSIS — I10 Essential (primary) hypertension: Secondary | ICD-10-CM | POA: Diagnosis not present

## 2018-08-18 DIAGNOSIS — M9702XD Periprosthetic fracture around internal prosthetic left hip joint, subsequent encounter: Secondary | ICD-10-CM | POA: Diagnosis not present

## 2018-08-18 DIAGNOSIS — R41841 Cognitive communication deficit: Secondary | ICD-10-CM | POA: Diagnosis not present

## 2018-08-18 DIAGNOSIS — E1142 Type 2 diabetes mellitus with diabetic polyneuropathy: Secondary | ICD-10-CM | POA: Diagnosis not present

## 2018-08-18 DIAGNOSIS — R262 Difficulty in walking, not elsewhere classified: Secondary | ICD-10-CM | POA: Diagnosis not present

## 2018-08-18 DIAGNOSIS — R197 Diarrhea, unspecified: Secondary | ICD-10-CM | POA: Diagnosis not present

## 2018-08-18 DIAGNOSIS — R1314 Dysphagia, pharyngoesophageal phase: Secondary | ICD-10-CM | POA: Diagnosis not present

## 2018-08-19 DIAGNOSIS — M9702XD Periprosthetic fracture around internal prosthetic left hip joint, subsequent encounter: Secondary | ICD-10-CM | POA: Diagnosis not present

## 2018-08-19 DIAGNOSIS — R262 Difficulty in walking, not elsewhere classified: Secondary | ICD-10-CM | POA: Diagnosis not present

## 2018-08-19 DIAGNOSIS — R41841 Cognitive communication deficit: Secondary | ICD-10-CM | POA: Diagnosis not present

## 2018-08-19 DIAGNOSIS — M6281 Muscle weakness (generalized): Secondary | ICD-10-CM | POA: Diagnosis not present

## 2018-08-19 DIAGNOSIS — R1314 Dysphagia, pharyngoesophageal phase: Secondary | ICD-10-CM | POA: Diagnosis not present

## 2018-08-20 DIAGNOSIS — R41841 Cognitive communication deficit: Secondary | ICD-10-CM | POA: Diagnosis not present

## 2018-08-20 DIAGNOSIS — M6281 Muscle weakness (generalized): Secondary | ICD-10-CM | POA: Diagnosis not present

## 2018-08-20 DIAGNOSIS — R262 Difficulty in walking, not elsewhere classified: Secondary | ICD-10-CM | POA: Diagnosis not present

## 2018-08-20 DIAGNOSIS — R1314 Dysphagia, pharyngoesophageal phase: Secondary | ICD-10-CM | POA: Diagnosis not present

## 2018-08-20 DIAGNOSIS — M9702XD Periprosthetic fracture around internal prosthetic left hip joint, subsequent encounter: Secondary | ICD-10-CM | POA: Diagnosis not present

## 2018-08-23 DIAGNOSIS — R41841 Cognitive communication deficit: Secondary | ICD-10-CM | POA: Diagnosis not present

## 2018-08-23 DIAGNOSIS — M9702XD Periprosthetic fracture around internal prosthetic left hip joint, subsequent encounter: Secondary | ICD-10-CM | POA: Diagnosis not present

## 2018-08-23 DIAGNOSIS — R1314 Dysphagia, pharyngoesophageal phase: Secondary | ICD-10-CM | POA: Diagnosis not present

## 2018-08-23 DIAGNOSIS — R262 Difficulty in walking, not elsewhere classified: Secondary | ICD-10-CM | POA: Diagnosis not present

## 2018-08-23 DIAGNOSIS — M6281 Muscle weakness (generalized): Secondary | ICD-10-CM | POA: Diagnosis not present

## 2018-08-24 DIAGNOSIS — M9702XD Periprosthetic fracture around internal prosthetic left hip joint, subsequent encounter: Secondary | ICD-10-CM | POA: Diagnosis not present

## 2018-08-24 DIAGNOSIS — R1314 Dysphagia, pharyngoesophageal phase: Secondary | ICD-10-CM | POA: Diagnosis not present

## 2018-08-24 DIAGNOSIS — M6281 Muscle weakness (generalized): Secondary | ICD-10-CM | POA: Diagnosis not present

## 2018-08-24 DIAGNOSIS — R41841 Cognitive communication deficit: Secondary | ICD-10-CM | POA: Diagnosis not present

## 2018-08-24 DIAGNOSIS — R262 Difficulty in walking, not elsewhere classified: Secondary | ICD-10-CM | POA: Diagnosis not present

## 2018-08-25 DIAGNOSIS — R1314 Dysphagia, pharyngoesophageal phase: Secondary | ICD-10-CM | POA: Diagnosis not present

## 2018-08-25 DIAGNOSIS — E1142 Type 2 diabetes mellitus with diabetic polyneuropathy: Secondary | ICD-10-CM | POA: Diagnosis not present

## 2018-08-25 DIAGNOSIS — M9702XD Periprosthetic fracture around internal prosthetic left hip joint, subsequent encounter: Secondary | ICD-10-CM | POA: Diagnosis not present

## 2018-08-25 DIAGNOSIS — R262 Difficulty in walking, not elsewhere classified: Secondary | ICD-10-CM | POA: Diagnosis not present

## 2018-08-25 DIAGNOSIS — M6281 Muscle weakness (generalized): Secondary | ICD-10-CM | POA: Diagnosis not present

## 2018-08-25 DIAGNOSIS — R41841 Cognitive communication deficit: Secondary | ICD-10-CM | POA: Diagnosis not present

## 2018-08-31 DIAGNOSIS — E119 Type 2 diabetes mellitus without complications: Secondary | ICD-10-CM | POA: Diagnosis not present

## 2018-08-31 DIAGNOSIS — E038 Other specified hypothyroidism: Secondary | ICD-10-CM | POA: Diagnosis not present

## 2018-08-31 DIAGNOSIS — D518 Other vitamin B12 deficiency anemias: Secondary | ICD-10-CM | POA: Diagnosis not present

## 2018-09-14 ENCOUNTER — Telehealth (HOSPITAL_COMMUNITY): Payer: Self-pay | Admitting: Rehabilitation

## 2018-09-14 NOTE — Telephone Encounter (Signed)

## 2018-09-15 ENCOUNTER — Ambulatory Visit (INDEPENDENT_AMBULATORY_CARE_PROVIDER_SITE_OTHER): Payer: Medicare Other | Admitting: Vascular Surgery

## 2018-09-15 ENCOUNTER — Other Ambulatory Visit: Payer: Self-pay

## 2018-09-15 ENCOUNTER — Encounter: Payer: Self-pay | Admitting: Vascular Surgery

## 2018-09-15 VITALS — BP 116/71 | HR 56 | Temp 97.4°F | Resp 20 | Ht 73.0 in | Wt 198.0 lb

## 2018-09-15 DIAGNOSIS — I70262 Atherosclerosis of native arteries of extremities with gangrene, left leg: Secondary | ICD-10-CM | POA: Diagnosis not present

## 2018-09-15 NOTE — Progress Notes (Signed)
Patient name: Terry Green MRN: [id number] DOB: 11/30/1940 Sex: male  REASON FOR VISIT:   Follow-up of left foot wound  HPI:   Terry Green is a pleasant 78 y.o. male who I last saw on 08/04/2018.  He has a left popliteal to peroneal artery bypass.  This is his only runoff to the left foot.  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  He does not have any further options for revascularization.  I debrided his foot some at the last visit.  I felt that if the wounds did not heal he would require an above-the-knee amputation.  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  Since I saw him last, they have been doing the dressing changes at the nursing facility.  He has no specific complaints.  He denies fever or chills.  Current Outpatient Medications  Medication Sig Dispense Refill  . acetaminophen (TYLENOL) 325 MG tablet Take 650 mg by mouth every 4 (four) hours as needed for moderate pain or fever.    Marland Kitchen alum & mag hydroxide-simeth (MAALOX PLUS) 400-400-40 MG/5ML suspension Take 15 mLs by mouth every 2 (two) hours as needed for indigestion. Max 6 doses per 24 hours    . amLODipine (NORVASC) 10 MG tablet Take 10 mg by mouth daily. (0900)    . aspirin EC 81 MG tablet Take 81 mg by mouth daily. (0900)    . atorvastatin (LIPITOR) 40 MG tablet Take 40 mg by mouth daily at 8 pm.    . busPIRone (BUSPAR) 7.5 MG tablet Take 7.5 mg by mouth daily.    . carvedilol (COREG) 25 MG tablet Take 25 mg by mouth 2 (two) times daily. (0900 & 2000)    . divalproex (DEPAKOTE) 250 MG DR tablet Take 500 mg by mouth 2 (two) times daily. (0900 & 2000)    . ferrous sulfate 325 (65 FE) MG tablet Take 325 mg by mouth 2 (two) times daily.     . fluticasone (FLONASE) 50 MCG/ACT nasal spray Place 2 sprays into both nostrils daily.     Marland Kitchen gabapentin (NEURONTIN) 300 MG capsule Take 300 mg by mouth 3 (three) times daily. (0900, 1600, & 2000)    . guaiFENesin (ROBITUSSIN MUCUS+CHEST CONGEST) 100 MG/5ML  liquid Take 200 mg by mouth every 4 (four) hours as needed for cough.    . hydrALAZINE (APRESOLINE) 50 MG tablet Take 50 mg by mouth 3 (three) times daily. (0800, 1600, & 2000)    . HYDROcodone-acetaminophen (NORCO/VICODIN) 5-325 MG tablet Take 1 tablet by mouth every 4 (four) hours as needed (for pain.). 30 tablet 0  . Insulin Glargine (BASAGLAR KWIKPEN) 100 UNIT/ML SOPN Inject 30 Units into the skin at bedtime. (2000)     . ipratropium-albuterol (DUONEB) 0.5-2.5 (3) MG/3ML SOLN Take 3 mLs by nebulization See admin instructions. Inhale 3 ml 4 times daily for 7 days then inhale 3 mls every 6 hours as needed for shortness of breath    . loratadine (CLARITIN) 10 MG tablet Take 10 mg by mouth daily. (0900)    . memantine (NAMENDA) 10 MG tablet Take 10 mg by mouth 2 (two) times daily. (0900 & 2000)    . Multiple Vitamins-Minerals (MULTIVITAMIN WITH MINERALS) tablet Take 1 tablet by mouth daily. (0900)    . NOVOLIN N 100 UNIT/ML injection Inject 55 Units into the skin 2 (two) times daily.    . Nutritional Supplements (PROMOD) LIQD Take 30 mLs by mouth daily.    Marland Kitchen  oxybutynin (DITROPAN) 5 MG tablet Take 10 mg by mouth daily.    . polyethylene glycol powder (GLYCOLAX/MIRALAX) powder Take 17 g by mouth daily as needed (constipation).    . sertraline (ZOLOFT) 100 MG tablet Take 100 mg by mouth daily. (0900)    . tamsulosin (FLOMAX) 0.4 MG CAPS capsule Take 0.4 mg by mouth daily at 8 pm. (2000)    . vitamin C (ASCORBIC ACID) 500 MG tablet Take 500 mg by mouth 2 (two) times daily.     No current facility-administered medications for this visit.     REVIEW OF SYSTEMS:  [X]  denotes positive finding, [ ]  denotes negative finding Vascular    Leg swelling    Cardiac    Chest pain or chest pressure:    Shortness of breath upon exertion:    Short of breath when lying flat:    Irregular heart rhythm:    Constitutional    Fever or chills:     PHYSICAL EXAM:   Vitals:   09/15/18 1000  BP: 116/71   Pulse: (!) 56  Resp: 20  Temp: (!) 97.4 F (36.3 C)  SpO2: 97%  Weight: 198 lb (89.8 kg)  Height: 6\' 1"  (1.854 m)    GENERAL: The patient is a well-nourished male, in no acute distress. The vital signs are documented above. CARDIOVASCULAR: There is a regular rate and rhythm. PULMONARY: There is good air exchange bilaterally without wheezing or rales. VASCULAR: He has a brisk but monophasic peroneal signal with the Doppler. EXTREMITIES: His wounds have improved compared to the last visit. The wound on the lateral aspect of his right leg measures 60 mm in length by 28 mm in width. The wound on the lateral aspect of his transmetatarsal amputation measures 18 mm in diameter.       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  He will continue dressing changes with hydrogel.  I think his bypass graft is patent as he has a brisk peroneal signal with the Doppler.  I have ordered a follow-up graft duplex in 3 months when he returns for a follow-up visit.  He understands that if this does not heal he would require an above-the-knee amputation.  Thus since we really have nothing to lose and the wound does appear to be gradually improving we will continue to give this the benefit of the doubt.  Deitra Mayo [facility] [phone]

## 2018-09-16 DIAGNOSIS — E785 Hyperlipidemia, unspecified: Secondary | ICD-10-CM | POA: Diagnosis not present

## 2018-09-16 DIAGNOSIS — E119 Type 2 diabetes mellitus without complications: Secondary | ICD-10-CM | POA: Diagnosis not present

## 2018-09-16 DIAGNOSIS — D649 Anemia, unspecified: Secondary | ICD-10-CM | POA: Diagnosis not present

## 2018-09-16 DIAGNOSIS — I10 Essential (primary) hypertension: Secondary | ICD-10-CM | POA: Diagnosis not present

## 2018-09-24 DIAGNOSIS — F411 Generalized anxiety disorder: Secondary | ICD-10-CM | POA: Diagnosis not present

## 2018-09-24 DIAGNOSIS — F0150 Vascular dementia without behavioral disturbance: Secondary | ICD-10-CM | POA: Diagnosis not present

## 2018-09-24 DIAGNOSIS — F3341 Major depressive disorder, recurrent, in partial remission: Secondary | ICD-10-CM | POA: Diagnosis not present

## 2018-10-01 DIAGNOSIS — D649 Anemia, unspecified: Secondary | ICD-10-CM | POA: Diagnosis not present

## 2018-10-01 DIAGNOSIS — E785 Hyperlipidemia, unspecified: Secondary | ICD-10-CM | POA: Diagnosis not present

## 2018-10-01 DIAGNOSIS — E119 Type 2 diabetes mellitus without complications: Secondary | ICD-10-CM | POA: Diagnosis not present

## 2018-10-01 DIAGNOSIS — I10 Essential (primary) hypertension: Secondary | ICD-10-CM | POA: Diagnosis not present

## 2018-10-01 DIAGNOSIS — R569 Unspecified convulsions: Secondary | ICD-10-CM | POA: Diagnosis not present

## 2018-10-15 DIAGNOSIS — E119 Type 2 diabetes mellitus without complications: Secondary | ICD-10-CM | POA: Diagnosis not present

## 2018-10-15 DIAGNOSIS — E038 Other specified hypothyroidism: Secondary | ICD-10-CM | POA: Diagnosis not present

## 2018-10-15 DIAGNOSIS — D518 Other vitamin B12 deficiency anemias: Secondary | ICD-10-CM | POA: Diagnosis not present

## 2018-10-18 DIAGNOSIS — F411 Generalized anxiety disorder: Secondary | ICD-10-CM | POA: Diagnosis not present

## 2018-10-18 DIAGNOSIS — F3342 Major depressive disorder, recurrent, in full remission: Secondary | ICD-10-CM | POA: Diagnosis not present

## 2018-10-18 DIAGNOSIS — F0150 Vascular dementia without behavioral disturbance: Secondary | ICD-10-CM | POA: Diagnosis not present

## 2018-10-19 DIAGNOSIS — F319 Bipolar disorder, unspecified: Secondary | ICD-10-CM | POA: Diagnosis not present

## 2018-10-19 DIAGNOSIS — D508 Other iron deficiency anemias: Secondary | ICD-10-CM | POA: Diagnosis not present

## 2018-10-19 DIAGNOSIS — E1142 Type 2 diabetes mellitus with diabetic polyneuropathy: Secondary | ICD-10-CM | POA: Diagnosis not present

## 2018-10-19 DIAGNOSIS — I10 Essential (primary) hypertension: Secondary | ICD-10-CM | POA: Diagnosis not present

## 2018-10-22 DIAGNOSIS — Z20828 Contact with and (suspected) exposure to other viral communicable diseases: Secondary | ICD-10-CM | POA: Diagnosis not present

## 2018-11-05 DIAGNOSIS — E119 Type 2 diabetes mellitus without complications: Secondary | ICD-10-CM | POA: Diagnosis not present

## 2018-11-05 DIAGNOSIS — Z20828 Contact with and (suspected) exposure to other viral communicable diseases: Secondary | ICD-10-CM | POA: Diagnosis not present

## 2018-11-05 DIAGNOSIS — D518 Other vitamin B12 deficiency anemias: Secondary | ICD-10-CM | POA: Diagnosis not present

## 2018-11-05 DIAGNOSIS — E038 Other specified hypothyroidism: Secondary | ICD-10-CM | POA: Diagnosis not present

## 2018-11-13 DIAGNOSIS — Z20828 Contact with and (suspected) exposure to other viral communicable diseases: Secondary | ICD-10-CM | POA: Diagnosis not present

## 2018-11-19 DIAGNOSIS — I70262 Atherosclerosis of native arteries of extremities with gangrene, left leg: Secondary | ICD-10-CM | POA: Diagnosis not present

## 2018-11-19 DIAGNOSIS — E1142 Type 2 diabetes mellitus with diabetic polyneuropathy: Secondary | ICD-10-CM | POA: Diagnosis not present

## 2018-11-19 DIAGNOSIS — F3342 Major depressive disorder, recurrent, in full remission: Secondary | ICD-10-CM | POA: Diagnosis not present

## 2018-11-19 DIAGNOSIS — F0150 Vascular dementia without behavioral disturbance: Secondary | ICD-10-CM | POA: Diagnosis not present

## 2018-11-19 DIAGNOSIS — F064 Anxiety disorder due to known physiological condition: Secondary | ICD-10-CM | POA: Diagnosis not present

## 2018-11-19 DIAGNOSIS — I10 Essential (primary) hypertension: Secondary | ICD-10-CM | POA: Diagnosis not present

## 2018-11-19 DIAGNOSIS — F0390 Unspecified dementia without behavioral disturbance: Secondary | ICD-10-CM | POA: Diagnosis not present

## 2018-11-21 DIAGNOSIS — Z20828 Contact with and (suspected) exposure to other viral communicable diseases: Secondary | ICD-10-CM | POA: Diagnosis not present

## 2018-11-22 DIAGNOSIS — M9702XD Periprosthetic fracture around internal prosthetic left hip joint, subsequent encounter: Secondary | ICD-10-CM | POA: Diagnosis not present

## 2018-11-22 DIAGNOSIS — R262 Difficulty in walking, not elsewhere classified: Secondary | ICD-10-CM | POA: Diagnosis not present

## 2018-11-22 DIAGNOSIS — M6281 Muscle weakness (generalized): Secondary | ICD-10-CM | POA: Diagnosis not present

## 2018-11-22 DIAGNOSIS — R41841 Cognitive communication deficit: Secondary | ICD-10-CM | POA: Diagnosis not present

## 2018-11-22 DIAGNOSIS — R1314 Dysphagia, pharyngoesophageal phase: Secondary | ICD-10-CM | POA: Diagnosis not present

## 2018-11-22 DIAGNOSIS — R1312 Dysphagia, oropharyngeal phase: Secondary | ICD-10-CM | POA: Diagnosis not present

## 2018-11-22 DIAGNOSIS — E43 Unspecified severe protein-calorie malnutrition: Secondary | ICD-10-CM | POA: Diagnosis not present

## 2018-11-23 DIAGNOSIS — R1314 Dysphagia, pharyngoesophageal phase: Secondary | ICD-10-CM | POA: Diagnosis not present

## 2018-11-23 DIAGNOSIS — M9702XD Periprosthetic fracture around internal prosthetic left hip joint, subsequent encounter: Secondary | ICD-10-CM | POA: Diagnosis not present

## 2018-11-23 DIAGNOSIS — R1312 Dysphagia, oropharyngeal phase: Secondary | ICD-10-CM | POA: Diagnosis not present

## 2018-11-23 DIAGNOSIS — M6281 Muscle weakness (generalized): Secondary | ICD-10-CM | POA: Diagnosis not present

## 2018-11-23 DIAGNOSIS — R262 Difficulty in walking, not elsewhere classified: Secondary | ICD-10-CM | POA: Diagnosis not present

## 2018-11-23 DIAGNOSIS — R41841 Cognitive communication deficit: Secondary | ICD-10-CM | POA: Diagnosis not present

## 2018-11-24 DIAGNOSIS — R1314 Dysphagia, pharyngoesophageal phase: Secondary | ICD-10-CM | POA: Diagnosis not present

## 2018-11-24 DIAGNOSIS — R262 Difficulty in walking, not elsewhere classified: Secondary | ICD-10-CM | POA: Diagnosis not present

## 2018-11-24 DIAGNOSIS — M6281 Muscle weakness (generalized): Secondary | ICD-10-CM | POA: Diagnosis not present

## 2018-11-24 DIAGNOSIS — R41841 Cognitive communication deficit: Secondary | ICD-10-CM | POA: Diagnosis not present

## 2018-11-24 DIAGNOSIS — M9702XD Periprosthetic fracture around internal prosthetic left hip joint, subsequent encounter: Secondary | ICD-10-CM | POA: Diagnosis not present

## 2018-11-24 DIAGNOSIS — R1312 Dysphagia, oropharyngeal phase: Secondary | ICD-10-CM | POA: Diagnosis not present

## 2018-11-25 DIAGNOSIS — R1314 Dysphagia, pharyngoesophageal phase: Secondary | ICD-10-CM | POA: Diagnosis not present

## 2018-11-25 DIAGNOSIS — R262 Difficulty in walking, not elsewhere classified: Secondary | ICD-10-CM | POA: Diagnosis not present

## 2018-11-25 DIAGNOSIS — R41841 Cognitive communication deficit: Secondary | ICD-10-CM | POA: Diagnosis not present

## 2018-11-25 DIAGNOSIS — M6281 Muscle weakness (generalized): Secondary | ICD-10-CM | POA: Diagnosis not present

## 2018-11-25 DIAGNOSIS — M9702XD Periprosthetic fracture around internal prosthetic left hip joint, subsequent encounter: Secondary | ICD-10-CM | POA: Diagnosis not present

## 2018-11-25 DIAGNOSIS — R1312 Dysphagia, oropharyngeal phase: Secondary | ICD-10-CM | POA: Diagnosis not present

## 2018-11-26 DIAGNOSIS — R1312 Dysphagia, oropharyngeal phase: Secondary | ICD-10-CM | POA: Diagnosis not present

## 2018-11-26 DIAGNOSIS — M9702XD Periprosthetic fracture around internal prosthetic left hip joint, subsequent encounter: Secondary | ICD-10-CM | POA: Diagnosis not present

## 2018-11-26 DIAGNOSIS — Z20828 Contact with and (suspected) exposure to other viral communicable diseases: Secondary | ICD-10-CM | POA: Diagnosis not present

## 2018-11-26 DIAGNOSIS — R41841 Cognitive communication deficit: Secondary | ICD-10-CM | POA: Diagnosis not present

## 2018-11-26 DIAGNOSIS — M6281 Muscle weakness (generalized): Secondary | ICD-10-CM | POA: Diagnosis not present

## 2018-11-26 DIAGNOSIS — R1314 Dysphagia, pharyngoesophageal phase: Secondary | ICD-10-CM | POA: Diagnosis not present

## 2018-11-26 DIAGNOSIS — R262 Difficulty in walking, not elsewhere classified: Secondary | ICD-10-CM | POA: Diagnosis not present

## 2018-11-29 DIAGNOSIS — M9702XD Periprosthetic fracture around internal prosthetic left hip joint, subsequent encounter: Secondary | ICD-10-CM | POA: Diagnosis not present

## 2018-11-29 DIAGNOSIS — R262 Difficulty in walking, not elsewhere classified: Secondary | ICD-10-CM | POA: Diagnosis not present

## 2018-11-29 DIAGNOSIS — R41841 Cognitive communication deficit: Secondary | ICD-10-CM | POA: Diagnosis not present

## 2018-11-29 DIAGNOSIS — R1312 Dysphagia, oropharyngeal phase: Secondary | ICD-10-CM | POA: Diagnosis not present

## 2018-11-29 DIAGNOSIS — R1314 Dysphagia, pharyngoesophageal phase: Secondary | ICD-10-CM | POA: Diagnosis not present

## 2018-11-29 DIAGNOSIS — M6281 Muscle weakness (generalized): Secondary | ICD-10-CM | POA: Diagnosis not present

## 2018-11-30 DIAGNOSIS — F3342 Major depressive disorder, recurrent, in full remission: Secondary | ICD-10-CM | POA: Diagnosis not present

## 2018-11-30 DIAGNOSIS — R1314 Dysphagia, pharyngoesophageal phase: Secondary | ICD-10-CM | POA: Diagnosis not present

## 2018-11-30 DIAGNOSIS — R262 Difficulty in walking, not elsewhere classified: Secondary | ICD-10-CM | POA: Diagnosis not present

## 2018-11-30 DIAGNOSIS — F411 Generalized anxiety disorder: Secondary | ICD-10-CM | POA: Diagnosis not present

## 2018-11-30 DIAGNOSIS — F0150 Vascular dementia without behavioral disturbance: Secondary | ICD-10-CM | POA: Diagnosis not present

## 2018-11-30 DIAGNOSIS — M6281 Muscle weakness (generalized): Secondary | ICD-10-CM | POA: Diagnosis not present

## 2018-11-30 DIAGNOSIS — R41841 Cognitive communication deficit: Secondary | ICD-10-CM | POA: Diagnosis not present

## 2018-11-30 DIAGNOSIS — M9702XD Periprosthetic fracture around internal prosthetic left hip joint, subsequent encounter: Secondary | ICD-10-CM | POA: Diagnosis not present

## 2018-11-30 DIAGNOSIS — R1312 Dysphagia, oropharyngeal phase: Secondary | ICD-10-CM | POA: Diagnosis not present

## 2018-12-01 DIAGNOSIS — M6281 Muscle weakness (generalized): Secondary | ICD-10-CM | POA: Diagnosis not present

## 2018-12-01 DIAGNOSIS — R1314 Dysphagia, pharyngoesophageal phase: Secondary | ICD-10-CM | POA: Diagnosis not present

## 2018-12-01 DIAGNOSIS — R41841 Cognitive communication deficit: Secondary | ICD-10-CM | POA: Diagnosis not present

## 2018-12-01 DIAGNOSIS — R1312 Dysphagia, oropharyngeal phase: Secondary | ICD-10-CM | POA: Diagnosis not present

## 2018-12-01 DIAGNOSIS — M9702XD Periprosthetic fracture around internal prosthetic left hip joint, subsequent encounter: Secondary | ICD-10-CM | POA: Diagnosis not present

## 2018-12-01 DIAGNOSIS — R262 Difficulty in walking, not elsewhere classified: Secondary | ICD-10-CM | POA: Diagnosis not present

## 2018-12-02 DIAGNOSIS — R262 Difficulty in walking, not elsewhere classified: Secondary | ICD-10-CM | POA: Diagnosis not present

## 2018-12-02 DIAGNOSIS — R1312 Dysphagia, oropharyngeal phase: Secondary | ICD-10-CM | POA: Diagnosis not present

## 2018-12-02 DIAGNOSIS — M9702XD Periprosthetic fracture around internal prosthetic left hip joint, subsequent encounter: Secondary | ICD-10-CM | POA: Diagnosis not present

## 2018-12-02 DIAGNOSIS — R1314 Dysphagia, pharyngoesophageal phase: Secondary | ICD-10-CM | POA: Diagnosis not present

## 2018-12-02 DIAGNOSIS — R41841 Cognitive communication deficit: Secondary | ICD-10-CM | POA: Diagnosis not present

## 2018-12-02 DIAGNOSIS — M6281 Muscle weakness (generalized): Secondary | ICD-10-CM | POA: Diagnosis not present

## 2018-12-03 DIAGNOSIS — Z20828 Contact with and (suspected) exposure to other viral communicable diseases: Secondary | ICD-10-CM | POA: Diagnosis not present

## 2018-12-08 DIAGNOSIS — E119 Type 2 diabetes mellitus without complications: Secondary | ICD-10-CM | POA: Diagnosis not present

## 2018-12-08 DIAGNOSIS — E038 Other specified hypothyroidism: Secondary | ICD-10-CM | POA: Diagnosis not present

## 2018-12-08 DIAGNOSIS — D518 Other vitamin B12 deficiency anemias: Secondary | ICD-10-CM | POA: Diagnosis not present

## 2018-12-11 DIAGNOSIS — Z20828 Contact with and (suspected) exposure to other viral communicable diseases: Secondary | ICD-10-CM | POA: Diagnosis not present

## 2018-12-17 DIAGNOSIS — Z20828 Contact with and (suspected) exposure to other viral communicable diseases: Secondary | ICD-10-CM | POA: Diagnosis not present

## 2018-12-21 DIAGNOSIS — F0150 Vascular dementia without behavioral disturbance: Secondary | ICD-10-CM | POA: Diagnosis not present

## 2018-12-23 DIAGNOSIS — Z20828 Contact with and (suspected) exposure to other viral communicable diseases: Secondary | ICD-10-CM | POA: Diagnosis not present

## 2018-12-25 DIAGNOSIS — F3342 Major depressive disorder, recurrent, in full remission: Secondary | ICD-10-CM | POA: Diagnosis not present

## 2018-12-25 DIAGNOSIS — F411 Generalized anxiety disorder: Secondary | ICD-10-CM | POA: Diagnosis not present

## 2018-12-27 ENCOUNTER — Other Ambulatory Visit: Payer: Self-pay

## 2018-12-27 DIAGNOSIS — I70262 Atherosclerosis of native arteries of extremities with gangrene, left leg: Secondary | ICD-10-CM

## 2018-12-29 ENCOUNTER — Ambulatory Visit (INDEPENDENT_AMBULATORY_CARE_PROVIDER_SITE_OTHER): Payer: Self-pay | Admitting: Vascular Surgery

## 2018-12-29 ENCOUNTER — Ambulatory Visit (HOSPITAL_COMMUNITY)
Admission: RE | Admit: 2018-12-29 | Discharge: 2018-12-29 | Disposition: A | Discharge status: Home / Self Care | Payer: Medicare Other | Source: Ambulatory Visit | Attending: Family | Admitting: Family

## 2018-12-29 ENCOUNTER — Other Ambulatory Visit: Payer: Self-pay

## 2018-12-29 ENCOUNTER — Encounter: Payer: Self-pay | Admitting: Vascular Surgery

## 2018-12-29 VITALS — BP 105/60 | HR 55 | Temp 97.1°F | Resp 20 | Ht 73.0 in | Wt 198.0 lb

## 2018-12-29 DIAGNOSIS — I70262 Atherosclerosis of native arteries of extremities with gangrene, left leg: Secondary | ICD-10-CM

## 2018-12-29 NOTE — Progress Notes (Signed)
Patient name: Terry Green MRN: [id number] DOB: [birth year] Sex: male  REASON FOR VISIT:   Follow-up of peripheral vascular disease.  HPI:   Terry Green is a pleasant 78 y.o. male with a history of peripheral vascular disease and a left foot wound.  He had a left popliteal to peroneal artery bypass on the left.  His only runoff is the peroneal artery.  He has no further options for revascularization.  His wounds on the left foot are documented in my note from 09/15/2018.  He came in today for a follow-up visit.  As per the findings below his popliteal to peroneal artery bypass graft is patent.   Of note, he is at a facility where there was reportedly some COVID positive patients.  He came today with a caregiver who was in full personal protective gear.  Given the concerns with some of the folks in the office we felt that the safest approach was not to do a formal visit.  I did tell him that his bypass graft was patent.  He was to continue dressing changes to the left foot and I arrange for a follow-up visit in 6 months.  They will call sooner if there is progression of the wounds on the left foot.  Current Outpatient Medications  Medication Sig Dispense Refill  . acetaminophen (TYLENOL) 325 MG tablet Take 650 mg by mouth every 4 (four) hours as needed for moderate pain or fever.    Marland Kitchen alum & mag hydroxide-simeth (MAALOX PLUS) 400-400-40 MG/5ML suspension Take 15 mLs by mouth every 2 (two) hours as needed for indigestion. Max 6 doses per 24 hours    . amLODipine (NORVASC) 10 MG tablet Take 10 mg by mouth daily. (0900)    . aspirin EC 81 MG tablet Take 81 mg by mouth daily. (0900)    . atorvastatin (LIPITOR) 40 MG tablet Take 40 mg by mouth daily at 8 pm.    . busPIRone (BUSPAR) 7.5 MG tablet Take 7.5 mg by mouth daily.    . carvedilol (COREG) 25 MG tablet Take 25 mg by mouth 2 (two) times daily. (0900 & 2000)    . divalproex (DEPAKOTE) 500 MG DR tablet     . ferrous sulfate 325 (65 FE) MG  tablet Take 325 mg by mouth 2 (two) times daily.     . fluticasone (FLONASE) 50 MCG/ACT nasal spray Place 2 sprays into both nostrils daily.     Marland Kitchen gabapentin (NEURONTIN) 300 MG capsule Take 300 mg by mouth 3 (three) times daily. (0900, 1600, & 2000)    . guaiFENesin (ROBITUSSIN MUCUS+CHEST CONGEST) 100 MG/5ML liquid Take 200 mg by mouth every 4 (four) hours as needed for cough.    . hydrALAZINE (APRESOLINE) 50 MG tablet Take 50 mg by mouth 3 (three) times daily. (0800, 1600, & 2000)    . HYDROcodone-acetaminophen (NORCO/VICODIN) 5-325 MG tablet Take 1 tablet by mouth every 4 (four) hours as needed (for pain.). 30 tablet 0  . Insulin Glargine (BASAGLAR KWIKPEN) 100 UNIT/ML SOPN Inject 30 Units into the skin at bedtime. (2000)     . ipratropium-albuterol (DUONEB) 0.5-2.5 (3) MG/3ML SOLN Take 3 mLs by nebulization See admin instructions. Inhale 3 ml 4 times daily for 7 days then inhale 3 mls every 6 hours as needed for shortness of breath    . LEVEMIR FLEXTOUCH 100 UNIT/ML Pen     . loratadine (CLARITIN) 10 MG tablet Take 10 mg by mouth daily. (0900)    .  memantine (NAMENDA) 10 MG tablet Take 10 mg by mouth 2 (two) times daily. (0900 & 2000)    . Multiple Vitamins-Minerals (MULTIVITAMIN WITH MINERALS) tablet Take 1 tablet by mouth daily. (0900)    . NOVOLIN N 100 UNIT/ML injection Inject 55 Units into the skin 2 (two) times daily.    Marland Kitchen NOVOLIN R 100 UNIT/ML injection     . Nutritional Supplements (PROMOD) LIQD Take 30 mLs by mouth daily.    Marland Kitchen oxybutynin (DITROPAN) 5 MG tablet Take 10 mg by mouth daily.    . polyethylene glycol powder (GLYCOLAX/MIRALAX) powder Take 17 g by mouth daily as needed (constipation).    . sertraline (ZOLOFT) 50 MG tablet     . tamsulosin (FLOMAX) 0.4 MG CAPS capsule Take 0.4 mg by mouth daily at 8 pm. (2000)    . vitamin C (ASCORBIC ACID) 500 MG tablet Take 500 mg by mouth 2 (two) times daily.     No current facility-administered medications for this visit.     REVIEW  OF SYSTEMS:  [X]  denotes positive finding, [ ]  denotes negative finding Vascular    Leg swelling    Cardiac    Chest pain or chest pressure:    Shortness of breath upon exertion:    Short of breath when lying flat:    Irregular heart rhythm:    Constitutional    Fever or chills:     PHYSICAL EXAM:   Vitals:   12/29/18 0933  BP: 105/60  Pulse: (!) 55  Resp: 20  Temp: (!) 97.1 F (36.2 C)  SpO2: 92%  Weight: 198 lb (89.8 kg)  Height: 6\' 1"  (1.854 m)   I did not examine the patient.  DATA:   DUPLEX BYPASS GRAFT: Identified interpreted his duplex of his bypass graft.  He has a left below-knee popliteal to peroneal artery bypass graft that was done on 08/19/2016.  The graft is patent.  There is biphasic flow in the proximal and mid graft with monophasic flow at the distal anastomosis and beyond.  MEDICAL ISSUES:   PATENT LEFT POPLITEAL PERONEAL BYPASS: The patient's bypass graft is patent.  He has no further options for revascularization on the left.  They will continue with his dressing changes and he would require primary amputation if this failed to heal.  I will plan on seeing him back in 6 months.  He knows to call sooner if he has problems.  Deitra Mayo [facility] [phone]

## 2019-01-01 DIAGNOSIS — Z20828 Contact with and (suspected) exposure to other viral communicable diseases: Secondary | ICD-10-CM | POA: Diagnosis not present

## 2019-01-05 DIAGNOSIS — E038 Other specified hypothyroidism: Secondary | ICD-10-CM | POA: Diagnosis not present

## 2019-01-05 DIAGNOSIS — D518 Other vitamin B12 deficiency anemias: Secondary | ICD-10-CM | POA: Diagnosis not present

## 2019-01-05 DIAGNOSIS — E119 Type 2 diabetes mellitus without complications: Secondary | ICD-10-CM | POA: Diagnosis not present

## 2019-01-07 DIAGNOSIS — Z20828 Contact with and (suspected) exposure to other viral communicable diseases: Secondary | ICD-10-CM | POA: Diagnosis not present

## 2019-01-15 DIAGNOSIS — Z20828 Contact with and (suspected) exposure to other viral communicable diseases: Secondary | ICD-10-CM | POA: Diagnosis not present

## 2019-01-21 DIAGNOSIS — Z20828 Contact with and (suspected) exposure to other viral communicable diseases: Secondary | ICD-10-CM | POA: Diagnosis not present

## 2019-01-25 DIAGNOSIS — F0150 Vascular dementia without behavioral disturbance: Secondary | ICD-10-CM | POA: Diagnosis not present

## 2019-01-28 DIAGNOSIS — R569 Unspecified convulsions: Secondary | ICD-10-CM | POA: Diagnosis not present

## 2019-01-28 DIAGNOSIS — Z03818 Encounter for observation for suspected exposure to other biological agents ruled out: Secondary | ICD-10-CM | POA: Diagnosis not present

## 2019-01-28 DIAGNOSIS — Z20828 Contact with and (suspected) exposure to other viral communicable diseases: Secondary | ICD-10-CM | POA: Diagnosis not present

## 2019-02-03 DIAGNOSIS — D518 Other vitamin B12 deficiency anemias: Secondary | ICD-10-CM | POA: Diagnosis not present

## 2019-02-03 DIAGNOSIS — E038 Other specified hypothyroidism: Secondary | ICD-10-CM | POA: Diagnosis not present

## 2019-02-03 DIAGNOSIS — E119 Type 2 diabetes mellitus without complications: Secondary | ICD-10-CM | POA: Diagnosis not present

## 2019-02-04 DIAGNOSIS — Z20828 Contact with and (suspected) exposure to other viral communicable diseases: Secondary | ICD-10-CM | POA: Diagnosis not present

## 2019-02-11 DIAGNOSIS — Z20828 Contact with and (suspected) exposure to other viral communicable diseases: Secondary | ICD-10-CM | POA: Diagnosis not present

## 2019-02-17 DIAGNOSIS — F0150 Vascular dementia without behavioral disturbance: Secondary | ICD-10-CM | POA: Diagnosis not present

## 2019-02-17 DIAGNOSIS — G4701 Insomnia due to medical condition: Secondary | ICD-10-CM | POA: Diagnosis not present

## 2019-02-18 DIAGNOSIS — Z20828 Contact with and (suspected) exposure to other viral communicable diseases: Secondary | ICD-10-CM | POA: Diagnosis not present

## 2019-02-22 DIAGNOSIS — F0150 Vascular dementia without behavioral disturbance: Secondary | ICD-10-CM | POA: Diagnosis not present

## 2019-02-24 DIAGNOSIS — I10 Essential (primary) hypertension: Secondary | ICD-10-CM | POA: Diagnosis not present

## 2019-02-24 DIAGNOSIS — F0390 Unspecified dementia without behavioral disturbance: Secondary | ICD-10-CM | POA: Diagnosis not present

## 2019-02-24 DIAGNOSIS — F319 Bipolar disorder, unspecified: Secondary | ICD-10-CM | POA: Diagnosis not present

## 2019-02-24 DIAGNOSIS — F411 Generalized anxiety disorder: Secondary | ICD-10-CM | POA: Diagnosis not present

## 2019-06-16 DEATH — deceased
